# Patient Record
Sex: Male | Born: 1950
Health system: Southern US, Community
[De-identification: ages and names within clinical notes are randomized; demographics above are authoritative.]

## PROBLEM LIST (undated history)

## (undated) DIAGNOSIS — Z950 Presence of cardiac pacemaker: Secondary | ICD-10-CM

## (undated) DIAGNOSIS — I442 Atrioventricular block, complete: Secondary | ICD-10-CM

## (undated) DIAGNOSIS — I10 Essential (primary) hypertension: Secondary | ICD-10-CM

## (undated) DIAGNOSIS — M1990 Unspecified osteoarthritis, unspecified site: Secondary | ICD-10-CM

## (undated) DIAGNOSIS — I4891 Unspecified atrial fibrillation: Secondary | ICD-10-CM

## (undated) DIAGNOSIS — Z9989 Dependence on other enabling machines and devices: Secondary | ICD-10-CM

## (undated) DIAGNOSIS — G4733 Obstructive sleep apnea (adult) (pediatric): Secondary | ICD-10-CM

## (undated) DIAGNOSIS — G4730 Sleep apnea, unspecified: Secondary | ICD-10-CM

## (undated) HISTORY — PX: JOINT REPLACEMENT: SHX530

## (undated) HISTORY — DX: Obstructive sleep apnea (adult) (pediatric): G47.33

## (undated) HISTORY — DX: Sleep apnea, unspecified: G47.30

## (undated) HISTORY — DX: Essential (primary) hypertension: I10

## (undated) HISTORY — PX: KNEE SURGERY: SHX244

## (undated) HISTORY — PX: COLONOSCOPY: SHX174

## (undated) HISTORY — PX: APPENDECTOMY: SHX54

## (undated) HISTORY — DX: Dependence on other enabling machines and devices: Z99.89

---

## 2005-04-21 ENCOUNTER — Ambulatory Visit (HOSPITAL_COMMUNITY): Admission: RE | Admit: 2005-04-21 | Discharge: 2005-04-21 | Payer: Self-pay | Admitting: Internal Medicine

## 2009-09-23 ENCOUNTER — Encounter: Payer: Self-pay | Admitting: Pulmonary Disease

## 2009-10-18 ENCOUNTER — Telehealth (INDEPENDENT_AMBULATORY_CARE_PROVIDER_SITE_OTHER): Payer: Self-pay | Admitting: *Deleted

## 2009-10-25 ENCOUNTER — Ambulatory Visit: Payer: Self-pay | Admitting: Pulmonary Disease

## 2009-10-25 DIAGNOSIS — G4733 Obstructive sleep apnea (adult) (pediatric): Secondary | ICD-10-CM

## 2009-11-15 ENCOUNTER — Telehealth (INDEPENDENT_AMBULATORY_CARE_PROVIDER_SITE_OTHER): Payer: Self-pay | Admitting: *Deleted

## 2009-11-16 ENCOUNTER — Encounter: Payer: Self-pay | Admitting: Pulmonary Disease

## 2009-11-18 ENCOUNTER — Telehealth (INDEPENDENT_AMBULATORY_CARE_PROVIDER_SITE_OTHER): Payer: Self-pay | Admitting: *Deleted

## 2009-12-06 ENCOUNTER — Telehealth (INDEPENDENT_AMBULATORY_CARE_PROVIDER_SITE_OTHER): Payer: Self-pay | Admitting: *Deleted

## 2009-12-06 ENCOUNTER — Ambulatory Visit: Payer: Self-pay | Admitting: Pulmonary Disease

## 2010-01-02 ENCOUNTER — Encounter: Payer: Self-pay | Admitting: Pulmonary Disease

## 2010-01-23 ENCOUNTER — Encounter: Payer: Self-pay | Admitting: Pulmonary Disease

## 2010-04-27 NOTE — Letter (Signed)
Summary: Physician's handwritten letter for patient  Physician's handwritten letter for patient   Imported By: Sherian Rein 11/24/2009 11:29:12  _____________________________________________________________________  External Attachment:    Type:   Image     Comment:   External Document

## 2010-04-27 NOTE — Assessment & Plan Note (Signed)
Summary: consult for management of osa   Copy to:  Elvina Sidle Primary Provider/Referring Provider:  Elvina Sidle  CC:  Sleep Consult.  History of Present Illness: The pt is a 60y/o male who I have been asked to see for osa.  He underwent npsg in June of this year, and found to have mild to moderate osa with AHI of 20/hr with desats to 75%.  He was titrated as part of a split night study to an optimal pressure of 7cm.  The pt has been told that he has loud snoring, as well as pauses in his breathing during sleep.  He works as a Airline pilot, and has a variable sleep schedule.  He will typically get to bed around 10pm, and arises at 6am to start his day.  He feels rested upon arising.  He drives at all hours, and will take naps whenever he can while working.  He denies any alertness issues during the day, and denies any sleepiness while watching tv or movies on his days off.  He denies any sleepiness while driving.  His epworth score was 5.  Current Medications (verified): 1)  No Prescription Medications  Allergies (verified): No Known Drug Allergies  Past History:  Past Medical History: NONE PER PT.   Past Surgical History: APPENDECTOMY 1960S  Family History: Reviewed history and no changes required. heart disease: father cancer: mother (liver)   Social History: Reviewed history and no changes required. pt is married and lives with wife. pt has children. pt has hx of smoking occ cigars and pipe x 5 to 6 years.   pt works as a Naval architect.   Review of Systems  The patient denies shortness of breath with activity, shortness of breath at rest, productive cough, non-productive cough, coughing up blood, chest pain, irregular heartbeats, acid heartburn, indigestion, loss of appetite, weight change, abdominal pain, difficulty swallowing, sore throat, tooth/dental problems, headaches, nasal congestion/difficulty breathing through nose, sneezing, itching, ear ache, anxiety,  depression, hand/feet swelling, joint stiffness or pain, rash, change in color of mucus, and fever.    Vital Signs:  Patient profile:   60 year old male Height:      71 inches Weight:      257.38 pounds BMI:     36.03 O2 Sat:      96 % on Room air Temp:     98.2 degrees F oral Pulse rate:   81 / minute BP sitting:   116 / 80  (left arm) Cuff size:   large  Vitals Entered By: Arman Filter LPN (October 25, 2009 11:05 AM)  O2 Flow:  Room air CC: Sleep Consult Comments Medications reviewed with patient Arman Filter LPN  October 25, 2009 11:05 AM    Physical Exam  General:  obese male in nad Eyes:  PERRLA and EOMI.   Nose:  deviated septum to right with partial obstruction Mouth:  elongation of soft palate and uvula Neck:  no jvd, tmg, LN Lungs:  clear to auscultation Heart:  rrr, no mrg Abdomen:  soft and nontender, bs+ Extremities:  no edema or cyanosis, pulses intact dstally Neurologic:  alert, and oriented, moves all 4.   Impression & Recommendations:  Problem # 1:  OBSTRUCTIVE SLEEP APNEA (ICD-327.23) the pt has been diagnosed with mild to moderate osa, but denies any significant sleepiness issues during awake hours.  However, he works in a high risk occupation that will require him to either treat his osa, or be able  to document normal alertness during the day with a MWT at the sleep center.  I have recommended the former, and the pt is aggreeable.  Will go ahead and start him on his optimal cpap pressure of 7cm, and have asked him to work aggressively on weight loss.  I have also reminded him of his moral responsibility to not drive if he is sleepy.   Medications Added to Medication List This Visit: 1)  No Prescription Medications   Other Orders: Consultation Level IV (16109) DME Referral (DME)  Patient Instructions: 1)  will start on cpap at 7cm of pressure as discussed.  Please call if any tolerance issues. 2)  work on weight loss 3)  followup with me in 4  weeks, and please bring your machine to the visit.

## 2010-04-27 NOTE — Progress Notes (Signed)
Summary: download c pap-LMTCBx1  Phone Note Call from Patient Call back at [phone]   Caller: Patient Call For: clance Summary of Call: need to have c pap machine downloaded at [facility] today.would like to speak to megan to see if this is possible. Initial call taken by: Rickard Patience,  December 06, 2009 8:49 AM  Follow-up for Phone Call        spoke to [facility] and she states pt can bring in his machine, cord and any chips to [facility] and they will try to get download. I LMTCBx1 to advise the pt.Jennifer [facility] CMA  December 06, 2009 10:21 AM   pt was already seen today by dr. Shelle Iron and we were able to get down load off of his machine.  nothing further needed.  Aundra Millet Reynolds LPN  December 06, 2009 12:07 PM

## 2010-04-27 NOTE — Miscellaneous (Signed)
Summary: excellent compliance on cpap download  Clinical Lists Changes  download from 11/2009 to 12/2009 shows excellent compliance and control of osa

## 2010-04-27 NOTE — Progress Notes (Signed)
Summary: fax request/ cpap download - awaiting fax  Phone Note Call from Patient   Caller: Patient Call For: clance Summary of Call: pt has completed his cpap (usage of 4 hrs a day x 3 wks). needs [facility]. 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 please call pt to confirm when this is completed. [phone] Initial call taken by: Tivis Ringer, CNA,  November 15, 2009 10:40 AM  Follow-up for Phone Call        Aundra Millet, have you seen any results from [facility] on this patients CPAP? Michel Bickers [facility]  November 15, 2009 12:55 PM  no download on him.  per the last ov with [facility] on 10-25-2009, he was to f/u in 4 weeks with [facility] and bring his machine with to the visit and we would get a download then.  Aundra Millet Reynolds LPN  November 15, 2009 1:39 PM   Additional Follow-up for Phone Call Additional follow up Details #1::        [facility] Gweneth Dimitri RN  November 15, 2009 1:51 PM    Please call pt back re: being in compliance w/cpap machine - [phone]  Additional Follow-up by: Eugene Gavia,  November 15, 2009 3:18 PM    Additional Follow-up for Phone Call Additional follow up Details #2::    Called and spoke with pt.  He states that he has been using CPAP x 3 wks.  He states that instead of bringing his machine here and getting download, he wants [place] to call [facility] and get the 3 results of 3 wk download for [facility] to review now so that he can get the forms for his job filled out.  He wants to be able to drive out of state by the end of this wk and can not do this without having form filled out stating that he has been compliant with CPAP.  Pls advise thanks Follow-up by: Vernie Murders,  November 15, 2009 3:41 PM  Additional Follow-up for Phone Call Additional follow up Details #3:: Details for Additional Follow-up Action Taken: I am ok with doing that if his job will accept  only 3 weeks of data.  ATC pt NA, and unable to leave msg, WCB Vernie Murders  November 15, 2009 5:04 PM  Spoke with [facility] and they will have to check with Mardelle Matte regarding download status and will call us back and ask for [place].Michel Bickers [facility]  November 16, 2009 9:44 AM  According to [facility] they have not received the pt's card to do the download from CPAP. He will news to turn in his card for this to be done.Michel Bickers [facility]  November 16, 2009 9:51 AM  download shows excellent compliance and adequate control of AHI.  Will send a letter as above.  Additional Follow-up by: Barbaraann Share MD,  November 15, 2009 5:00 PM    Spoke with pt.  Pt states he is on "a modem" for 90 days that he should not have to take his card into [facility]--will call [facility] about this.  Pt requesting for this letter along with copy of cpap data be faxed to [facility] program Attn: Dr. Gemma Payor at [phone].  Gweneth Dimitri RN  November 16, 2009 10:07 AM   [facility].  Spoke with Darilyn.  Informed her pt states he  is on a modem.  Darilyn states this is correct and will fax over data to triage.  Once data is received will give to Megan/place in [facility] very important folder.  Gweneth Dimitri RN  November 16, 2009 10:08 AM  Data received and given to [facility] to place in [facility] very important folder.  Will forward message to [facility] as FYI.  Gweneth Dimitri RN  November 16, 2009 10:24 AM    [facility] did a letter.  Called pt and informed him letter is ready for [place] to fax to [facility]. but pt will need to sign a release of info giving [place] permission to fax this letter.  pt states he is currently  on the road and is unable to get to the office to sign ROI and has no access to a fax machine for me to fax ROI for him to sign.  Therefore, pt gave me a verbal ok to fax letter to his job and he understood that letter had his medical information documented in it. Faxed letter to [facility] at [phone].  Pt is also aware to keep pending appt  with [facility] for Sept 12 @ 11:00am  Arman Filter LPN  November 17, 2009 10:34 AM

## 2010-04-27 NOTE — Assessment & Plan Note (Signed)
Summary: rov for osa   Copy to:  Elvina Sidle Primary Segundo Makela/Referring Thailan Sava:  Elvina Sidle  CC:  follow up on cpap. uses 7/7 nights x5-6 hrs a night. Pt states he has no problmes with machine and states pressure feels like it is set fine.  History of Present Illness: the pt comes in today for f/u of his known osa.  He is wearing cpap compliantly, and reports no issues with mask or pressure.  He has been compliant with his device, and download today shows a 95% compliance greater than 4 hours.  He has no significant mask leak, and a well controlled AHI.  He believes that he is sleeping well, and thinks his alertness has improved.  Current Medications (verified): 1)  No Prescription Medications  Allergies (verified): No Known Drug Allergies  Review of Systems  The patient denies shortness of breath with activity, shortness of breath at rest, productive cough, non-productive cough, coughing up blood, chest pain, irregular heartbeats, acid heartburn, indigestion, loss of appetite, weight change, abdominal pain, difficulty swallowing, sore throat, tooth/dental problems, headaches, nasal congestion/difficulty breathing through nose, sneezing, itching, ear ache, anxiety, depression, hand/feet swelling, joint stiffness or pain, rash, change in color of mucus, and fever.    Vital Signs:  Patient profile:   60 year old male Height:      71 inches Weight:      259.25 pounds O2 Sat:      98 % on Room air Temp:     98.0 degrees F oral Pulse rate:   78 / minute BP sitting:   138 / 92  (right arm) Cuff size:   large  Vitals Entered By: Carver Fila (December 06, 2009 10:53 AM)  O2 Flow:  Room air CC: follow up on cpap. uses 7/7 nights x5-6 hrs a night. Pt states he has no problmes with machine and states pressure feels like it is set fine Comments meds and allergies updated Phone number updated Carver Fila  December 06, 2009 10:54 AM    Physical Exam  General:  ow male in nad   Nose:  no skin breakdown or pressure necrosis from cpap mask Extremities:  no edema or cyanosis  Neurologic:  alert, not sleepy, moves all 4.   Impression & Recommendations:  Problem # 1:  OBSTRUCTIVE SLEEP APNEA (ICD-327.23)  the pt is doing very well with cpap.  He is wearing compliantly as documented by a download done today, and feels that he has had a positive clinical response.  I have filled out paperwork to allow him to get his driver's card, and have encouraged him to work aggressively on weight loss.    Other Orders: Est. Patient Level III (56213)  Patient Instructions: 1)  continue with cpap, call if any issues with tolerance 2)  work on weight loss 3)  followup with me in 2mos. for download review.

## 2010-04-27 NOTE — Progress Notes (Signed)
Summary: records?   Phone Note Call from Patient Call back at [phone]   Caller: Patient Call For: clance Reason for Call: Talk to Nurse Summary of Call: Please verify we have sleep study eval on pt prior to visit on 08/01.  If we do not have, please contact pt asap so that he can get copy for [place]. Also, Members occupational health from [place] - pt had CPX done there. Initial call taken by: Eugene Gavia,  October 18, 2009 9:41 AM  Follow-up for Phone Call        no records up front in file.  LMOM TCB x1. Boone Master CNA/MA  October 18, 2009 10:43 AM   Notified pt that we didn't have any of his paperwork yet.  He said he understood and would work on getting those records to [place] before his visit.. Follow-up by: Eugene Gavia,  October 18, 2009 1:42 PM  Additional Follow-up for Phone Call Additional follow up Details #1::        noted by triage.  will sign off on message. Boone Master CNA/MA  October 18, 2009 1:45 PM

## 2010-04-27 NOTE — Progress Notes (Signed)
Summary: letter & info  Phone Note Call from Patient Call back at [phone]   Caller: Patient Call For: clance Reason for Call: Talk to Nurse Summary of Call: on cpap, suppose to get download from [facility] re: cpap - letter sent to [employer] from [facility] under care and all.  But needs download and any info re: compliance will help him get medical card.  Need to send everything that goes to [employer] # [phone] and  to also go to his company, [employer] attn: Whitney at [phone] Initial call taken by: Eugene Gavia,  November 18, 2009 9:13 AM  Follow-up for Phone Call        called and spoke with pt. pt states he also wants a copy of his compliance download faxed to [place] and his job, [employer] .  Faxed compliance download to both fax #s provided.  Aundra Millet Reynolds LPN  November 18, 2009 9:54 AM

## 2010-08-05 ENCOUNTER — Encounter: Payer: Self-pay | Admitting: Pulmonary Disease

## 2010-08-09 ENCOUNTER — Ambulatory Visit (INDEPENDENT_AMBULATORY_CARE_PROVIDER_SITE_OTHER): Payer: 59 | Admitting: Pulmonary Disease

## 2010-08-09 ENCOUNTER — Encounter: Payer: Self-pay | Admitting: Pulmonary Disease

## 2010-08-09 VITALS — BP 130/80 | HR 64 | Temp 98.1°F | Ht 71.0 in | Wt 269.0 lb

## 2010-08-09 DIAGNOSIS — G4733 Obstructive sleep apnea (adult) (pediatric): Secondary | ICD-10-CM

## 2010-08-09 NOTE — Assessment & Plan Note (Signed)
The pt is doing very well with cpap.  He is very compliant by download, no significant leaks, and a well controlled AHI.  I have asked him to continue with cpap, and encouraged him to work on weight reduction.  He will f/u with me in one year.

## 2010-08-09 NOTE — Progress Notes (Signed)
Subjective:     Patient ID: Harold Robertson, male   DOB: [birth year], 60 y.o.   MRN: [id number]  HPI The pt comes in today for f/u of his known osa.  He is wearing the device compliantly by his download, and his AHI is well controlled on his current settings.  He feels he sleeps well, and denies any issues with daytime sleepiness.  He has no sleepiness with driving.  He has been keeping up with needed supplies and mask changes.   Review of Systems  Constitutional: Negative for fever and unexpected weight change.  HENT: Negative for ear pain, nosebleeds, congestion, sore throat, rhinorrhea, sneezing, trouble swallowing, dental problem, postnasal drip and sinus pressure.   Eyes: Negative for redness and itching.  Respiratory: Negative for cough, chest tightness, shortness of breath and wheezing.   Cardiovascular: Negative for palpitations and leg swelling.  Gastrointestinal: Negative for nausea and vomiting.  Genitourinary: Negative for dysuria.  Musculoskeletal: Negative for joint swelling.  Skin: Negative for rash.  Neurological: Negative for headaches.  Hematological: Does not bruise/bleed easily.  Psychiatric/Behavioral: Negative for dysphoric mood. The patient is not nervous/anxious.        Objective:   Physical Exam Ow male in nad No skin breakdown or pressure necrosis from cpap mask LE without edema, no cyanosis Alert, does not appear sleepy, moves all 4     Assessment:      Plan:

## 2010-08-09 NOTE — Patient Instructions (Signed)
Continue with cpap, and work on weight reduction followup with me in one year.

## 2010-08-17 ENCOUNTER — Encounter: Payer: Self-pay | Admitting: Pulmonary Disease

## 2011-07-17 ENCOUNTER — Ambulatory Visit: Payer: 59 | Admitting: Family Medicine

## 2011-07-17 VITALS — BP 155/94 | HR 82 | Temp 98.6°F | Resp 16 | Ht 68.5 in | Wt 279.0 lb

## 2011-07-17 DIAGNOSIS — I1310 Hypertensive heart and chronic kidney disease without heart failure, with stage 1 through stage 4 chronic kidney disease, or unspecified chronic kidney disease: Secondary | ICD-10-CM

## 2011-07-17 DIAGNOSIS — R079 Chest pain, unspecified: Secondary | ICD-10-CM

## 2011-07-17 DIAGNOSIS — I10 Essential (primary) hypertension: Secondary | ICD-10-CM | POA: Insufficient documentation

## 2011-07-17 DIAGNOSIS — I447 Left bundle-branch block, unspecified: Secondary | ICD-10-CM

## 2011-07-17 MED ORDER — LISINOPRIL 20 MG PO TABS: 20.0000 mg | ORAL_TABLET | Freq: Every day | ORAL | Status: DC

## 2011-07-17 NOTE — Patient Instructions (Signed)
Obesity Obesity is defined as having a body mass index (BMI) of 30 or more. To calculate your BMI divide your weight in pounds by your height in inches squared and multiply that product by 703. Major illnesses resulting from long-term obesity include:  Stroke.   Heart disease.   Diabetes.   Many cancers.   Arthritis.  Obesity also complicates recovery from many other medical problems.  CAUSES   A history of obesity in your parents.   Thyroid hormone imbalance.   Environmental factors such as excess calorie intake and physical inactivity.  TREATMENT  A healthy weight loss program includes:  A calorie restricted diet based on individual calorie needs.   Increased physical activity (exercise).  An exercise program is just as important as the right low-calorie diet.  Weight-loss medicines should be used only under the supervision of your physician. These medicines help, but only if they are used with diet and exercise programs. Medicines can have side effects including nervousness, nausea, abdominal pain, diarrhea, headache, drowsiness, and depression.  An unhealthy weight loss program includes:  Fasting.   Fad diets.   Supplements and drugs.  These choices do not succeed in long-term weight control.  HOME CARE INSTRUCTIONS  To help you make the needed dietary changes:   Exercise and perform physical activity as directed by your caregiver.   Keep a daily record of everything you eat. There are many free websites to help you with this. It may be helpful to measure your foods so you can determine if you are eating the correct portion sizes.   Use low-calorie cookbooks or take special cooking classes.   Avoid alcohol. Drink more water and drinks with no calories.   Take vitamins and supplements only as recommended by your caregiver.   Weight loss support groups, Registered Dieticians, counselors, and stress reduction education can also be very helpful.  Document Released:  04/20/2004 Document Revised: 03/02/2011 Document Reviewed: 02/17/2007 [facility] Patient Information 2012 [place], [place].Hypertension As your heart beats, it forces blood through your arteries. This force is your blood pressure. If the pressure is too high, it is called hypertension (HTN) or high blood pressure. HTN is dangerous because you may have it and not know it. High blood pressure may mean that your heart has to work harder to pump blood. Your arteries may be narrow or stiff. The extra work puts you at risk for heart disease, stroke, and other problems.  Blood pressure consists of two numbers, a higher number over a lower, 110/72, for example. It is stated as "110 over 72." The ideal is below 120 for the top number (systolic) and under 80 for the bottom (diastolic). Write down your blood pressure today. You should pay close attention to your blood pressure if you have certain conditions such as:  Heart failure.   Prior heart attack.   Diabetes   Chronic kidney disease.   Prior stroke.   Multiple risk factors for heart disease.  To see if you have HTN, your blood pressure should be measured while you are seated with your arm held at the level of the heart. It should be measured at least twice. A one-time elevated blood pressure reading (especially in the Emergency Department) does not mean that you need treatment. There may be conditions in which the blood pressure is different between your right and left arms. It is important to see your caregiver soon for a recheck. Most people have essential hypertension which means that there is not a  specific cause. This type of high blood pressure may be lowered by changing lifestyle factors such as:  Stress.   Smoking.   Lack of exercise.   Excessive weight.   Drug/tobacco/alcohol use.   Eating less salt.  Most people do not have symptoms from high blood pressure until it has caused damage to the body. Effective treatment can often  prevent, delay or reduce that damage. TREATMENT  When a cause has been identified, treatment for high blood pressure is directed at the cause. There are a large number of medications to treat HTN. These fall into several categories, and your caregiver will help you select the medicines that are best for you. Medications may have side effects. You should review side effects with your caregiver. If your blood pressure stays high after you have made lifestyle changes or started on medicines,   Your medication(s) may need to be changed.   Other problems may need to be addressed.   Be certain you understand your prescriptions, and know how and when to take your medicine.   Be sure to follow up with your caregiver within the time frame advised (usually within two weeks) to have your blood pressure rechecked and to review your medications.   If you are taking more than one medicine to lower your blood pressure, make sure you know how and at what times they should be taken. Taking two medicines at the same time can result in blood pressure that is too low.  SEEK IMMEDIATE MEDICAL CARE IF:  You develop a severe headache, blurred or changing vision, or confusion.   You have unusual weakness or numbness, or a faint feeling.   You have severe chest or abdominal pain, vomiting, or breathing problems.  MAKE SURE YOU:   Understand these instructions.   Will watch your condition.   Will get help right away if you are not doing well or get worse.  Document Released: 03/13/2005 Document Revised: 03/02/2011 Document Reviewed: 11/01/2007 [facility] Patient Information 2012 [place], [place].

## 2011-07-17 NOTE — Progress Notes (Signed)
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 2 weeks ago he experience some vague left chest pain which was associated with a cough. The cough is resolved and the chest pain is a day and only intermittently noticeable at this point. He's been checking his blood pressure on the road and has been getting higher readings lately. He has a physical scheduled for the middle of the summer but because of the chest pain and the elevated readings, he's come by for further evaluation.  He's had no shortness of breath, orthopnea, edema, or nausea. He's had no abdominal pain, new rash, sore throat or reflux symptoms. He does have sleep apnea and is using his CPAP machine 90% of the time  Family history: No new illnesses  Objective: No acute distress, obese white male. 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 He has a persistent heart murmur which has not gone thoroughly evaluated and has left bundle branch block.   patient is aware that he has a weight problem and we discussed ways of reducing that.  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   Plan: Echocardiogram, start antihypertensive therapy, recheck blood pressure one month

## 2011-08-18 ENCOUNTER — Encounter: Payer: Self-pay | Admitting: Pulmonary Disease

## 2011-08-18 ENCOUNTER — Ambulatory Visit (INDEPENDENT_AMBULATORY_CARE_PROVIDER_SITE_OTHER): Payer: 59 | Admitting: Pulmonary Disease

## 2011-08-18 VITALS — BP 120/82 | HR 89 | Temp 97.6°F | Ht 71.0 in | Wt 276.2 lb

## 2011-08-18 DIAGNOSIS — G4733 Obstructive sleep apnea (adult) (pediatric): Secondary | ICD-10-CM

## 2011-08-18 NOTE — Assessment & Plan Note (Signed)
The patient is doing very well from a sleep apnea standpoint.  He is wearing his device compliantly, is sleeping well, and has excellent daytime alertness.  I've asked him to continue on CPAP, keep up with mass changes and supplies, and to work aggressively on weight loss.  He will followup with me in one year if doing well

## 2011-08-18 NOTE — Progress Notes (Signed)
  Subjective:    Patient ID: Harold Robertson, male    DOB: 1950/06/06, 61 y.o.   MRN: [id number]  HPI The patient comes in today for followup of his known obstructive sleep apnea.  He is wearing CPAP compliantly, and is having no issues with mask fit or pressure.  He feels that he is sleeping well, and has excellent daytime alertness.  His download today shows fantastic compliance, no significant leak, and good control of his obstructive events.   Review of Systems  Constitutional: Negative for fever and unexpected weight change.  HENT: Negative for ear pain, nosebleeds, congestion, sore throat, rhinorrhea, sneezing, trouble swallowing, dental problem, postnasal drip and sinus pressure.   Eyes: Negative for redness and itching.  Respiratory: Negative for cough, chest tightness, shortness of breath and wheezing.   Cardiovascular: Negative for palpitations and leg swelling.  Gastrointestinal: Negative for nausea and vomiting.  Genitourinary: Negative for dysuria.  Musculoskeletal: Negative for joint swelling.  Skin: Negative for rash.  Neurological: Negative for headaches.  Hematological: Does not bruise/bleed easily.  Psychiatric/Behavioral: Negative for dysphoric mood. The patient is not nervous/anxious.        Objective:   Physical Exam Overweight male in no acute distress Nose without purulence or discharge noted No skin breakdown or pressure necrosis from the CPAP mask Lower extremities with no edema, no cyanosis Alert and oriented, moves all 4 extremities.      Assessment & Plan:

## 2011-08-18 NOTE — Patient Instructions (Signed)
Continue with cpap, and keep up with mask changes and supplies. Work on weight loss followup with me in one year.  

## 2011-10-12 ENCOUNTER — Encounter: Payer: Self-pay | Admitting: Family Medicine

## 2012-08-07 ENCOUNTER — Other Ambulatory Visit: Payer: Self-pay | Admitting: Family Medicine

## 2012-09-16 ENCOUNTER — Ambulatory Visit: Payer: 59 | Admitting: Family Medicine

## 2012-09-16 VITALS — BP 122/94 | HR 60 | Temp 98.0°F | Resp 18 | Ht 69.0 in | Wt 272.6 lb

## 2012-09-16 DIAGNOSIS — I10 Essential (primary) hypertension: Secondary | ICD-10-CM

## 2012-09-16 MED ORDER — LISINOPRIL 20 MG PO TABS: ORAL_TABLET | ORAL | Status: DC

## 2012-09-16 NOTE — Progress Notes (Signed)
Patient ID: Harold Robertson MRN: [id number], DOB: 1950-12-25, 62 y.o. Date of Encounter: 09/16/2012, 2:51 PM  Primary Physician: Elvina Sidle, MD  Chief Complaint: HTN  HPI: 62 y.o. year old male with history below presents for hypertension follow up.  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   Past Medical History  Diagnosis Date  . Hypertension      Home Meds: Prior to Admission medications   Medication Sig Start Date End Date Taking? Authorizing Provider  lisinopril (PRINIVIL,ZESTRIL) 20 MG tablet One daily 09/16/12  Yes Elvina Sidle, MD    Allergies: No Known Allergies  History   Social History  . Marital Status: Single    Spouse Name: N/A    Number of Children: N/A  . Years of Education: N/A   Occupational History  . truck driver    Social History Main Topics  . Smoking status: Former Smoker -- 6 years    Types: Cigars  . Smokeless tobacco: Current User  . Alcohol Use: 1.2 oz/week    2 Cans of beer per week  . Drug Use: Yes  . Sexually Active: Yes   Other Topics Concern  . Not on file   Social History Narrative   Married, lives with wife   children     Family History  Problem Relation Age of Onset  . Heart disease Father   . Liver cancer Mother   . Heart disease Brother     Review of Systems: Constitutional: negative for chills, fever, night sweats, weight changes, or fatigue  HEENT: negative for vision changes, hearing loss, congestion, rhinorrhea, ST, epistaxis, or sinus pressure  He does not some bilateral pressure in ears Cardiovascular: negative for chest pain, palpitations, or DOE Respiratory: negative for hemoptysis, wheezing, shortness of breath, or cough Abdominal: negative for abdominal pain, nausea, vomiting, diarrhea, or constipation Dermatological: negative for rash Neurologic: negative for headache, dizziness, or syncope All other  systems reviewed and are otherwise negative with the exception to those above and in the HPI.   Physical Exam:  BP recheck 105/70 Blood pressure 122/94, pulse 60, temperature 98 F (36.7 C), temperature source Oral, resp. rate 18, height 5\' 9"  (1.753 m), weight 272 lb 9.6 oz (123.651 kg), SpO2 99.00%., Body mass index is 40.24 kg/(m^2). General: Well developed, well nourished, in no acute distress. Head: Normocephalic, atraumatic, eyes without discharge, sclera non-icteric, nares are without discharge. Bilateral auditory canals clear, TM's are without perforation, pearly grey but opaque and without much of a  reflective cone of light bilaterally. Oral cavity moist, posterior pharynx without exudate, erythema, peritonsillar abscess, or post nasal drip.  Neck: Supple. No thyromegaly. Full ROM. No lymphadenopathy. No carotid bruits. Lungs: Clear bilaterally to auscultation without wheezes, rales, or rhonchi. Breathing is unlabored. Heart: RRR with S1 S2. No murmurs, rubs, or gallops appreciated.  Abdomen: Soft, non-tender, non-distended with normoactive bowel sounds. No hepatosplenomegaly. No rebound/guarding. No obvious abdominal masses. Msk:  Strength and tone normal for age. Extremities/Skin: Warm and dry. No clubbing or cyanosis. No edema. No rashes or suspicious lesions. Distal pulses 2+ and equal bilaterally. Neuro: Alert and oriented X 3. Moves all extremities spontaneously. Gait is normal. CNII-XII grossly in tact. DTR 2+, cerebellar function intact. Rhomberg normal. Psych:  Responds to questions appropriately with a normal affect.    ASSESSMENT AND PLAN:  62 y.o. year old male with controlled sleep apnea and hypertension.  Some congestion c/w serous otitis Hypertension - Plan: lisinopril (PRINIVIL,ZESTRIL) 20 MG tablet   -  Signed, Elvina Sidle, MD 09/16/2012 2:51 PM

## 2012-09-16 NOTE — Patient Instructions (Addendum)
claritin or allegra for the ear stuffiness, Afrin  Blood pressure recheck 105/70 right arm sitting

## 2013-02-12 ENCOUNTER — Encounter: Payer: Self-pay | Admitting: Family Medicine

## 2013-08-21 ENCOUNTER — Other Ambulatory Visit: Payer: Self-pay | Admitting: Family Medicine

## 2013-09-15 ENCOUNTER — Ambulatory Visit (INDEPENDENT_AMBULATORY_CARE_PROVIDER_SITE_OTHER): Payer: 59 | Admitting: Family Medicine

## 2013-09-15 VITALS — BP 128/82 | HR 71 | Temp 97.9°F | Resp 16 | Ht 70.0 in | Wt 276.0 lb

## 2013-09-15 DIAGNOSIS — I10 Essential (primary) hypertension: Secondary | ICD-10-CM

## 2013-09-15 MED ORDER — LISINOPRIL 20 MG PO TABS
20.0000 mg | ORAL_TABLET | Freq: Every day | ORAL | Status: DC
Start: 2013-09-15 — End: 2013-09-30

## 2013-09-15 NOTE — Progress Notes (Signed)
   Subjective:    Patient ID: Harold Robertson, male    DOB: 1950-04-11, 63 y.o.   MRN: [id number]  HPI Patient presents today for refill of lisinopril. He was last seen at [facility] 1 year ago. He does not receive care anywhere else. He denies medication side effects. 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 He reports that he lost weight in the last year, but has gradually regained it.   He has not had a complete physical exam in awhile, is willing to have labs and CPE at a later date. He has waited for 3 hours today and refuses to stay for blood work.    Review of Systems No chest pain, no SOB, no edema. No nausea, no vomiting, no diarrhea.    Objective:   Physical Exam  Vitals reviewed. Constitutional: He is oriented to person, place, and time. He appears well-developed and well-nourished.  HENT:  Head: Normocephalic and atraumatic.  Right Ear: External ear normal.  Left Ear: External ear normal.  Eyes: Conjunctivae are normal. Right eye exhibits no discharge. Left eye exhibits no discharge. No scleral icterus.  Neck: Normal range of motion. Neck supple.  Cardiovascular: Normal rate.   Pulmonary/Chest: Effort normal and breath sounds normal.  Musculoskeletal: Normal range of motion.  Neurological: He is oriented to person, place, and time.  Skin: Skin is warm and dry.  Psychiatric: He has a normal mood and affect. His behavior is normal. Judgment and thought content normal.      Assessment & Plan:  1. Essential hypertension - lisinopril (PRINIVIL,ZESTRIL) 20 MG tablet; Take 1 tablet (20 mg total) by mouth daily.  Dispense: 90 tablet; Refill: 1 -Patient to call and make an appointment at the appointment center for CPE in 3-6 months.  Emi Belfast, FNP-BC  [facility]  09/15/2013 1:46 PM

## 2013-09-15 NOTE — Patient Instructions (Addendum)
Please call for an appointment in 3-6 months Increase activity and decrease food intake- make healthier choices- limit white bread, white rice, white pasta and sugar.  Hypertension Hypertension, commonly called high blood pressure, is when the force of blood pumping through your arteries is too strong. Your arteries are the blood vessels that carry blood from your heart throughout your body. A blood pressure reading consists of a higher number over a lower number, such as 110/72. The higher number (systolic) is the pressure inside your arteries when your heart pumps. The lower number (diastolic) is the pressure inside your arteries when your heart relaxes. Ideally you want your blood pressure below 120/80. Hypertension forces your heart to work harder to pump blood. Your arteries may become narrow or stiff. Having hypertension puts you at risk for heart disease, stroke, and other problems.  RISK FACTORS Some risk factors for high blood pressure are controllable. Others are not.  Risk factors you cannot control include:   Race. You may be at higher risk if you are African American.  Age. Risk increases with age.  Gender. Men are at higher risk than women before age 62 years. After age 62, women are at higher risk than men. Risk factors you can control include:  Not getting enough exercise or physical activity.  Being overweight.  Getting too much fat, sugar, calories, or salt in your diet.  Drinking too much alcohol. SIGNS AND SYMPTOMS Hypertension does not usually cause signs or symptoms. Extremely high blood pressure (hypertensive crisis) may cause headache, anxiety, shortness of breath, and nosebleed. DIAGNOSIS  To check if you have hypertension, your health care provider will measure your blood pressure while you are seated, with your arm held at the level of your heart. It should be measured at least twice using the same arm. Certain conditions can cause a difference in blood pressure  between your right and left arms. A blood pressure reading that is higher than normal on one occasion does not mean that you need treatment. If one blood pressure reading is high, ask your health care provider about having it checked again. TREATMENT  Treating high blood pressure includes making lifestyle changes and possibly taking medication. Living a healthy lifestyle can help lower high blood pressure. You may need to change some of your habits. Lifestyle changes may include:  Following the DASH diet. This diet is high in fruits, vegetables, and whole grains. It is low in salt, red meat, and added sugars.  Getting at least 2 1/2 hours of brisk physical activity every week.  Losing weight if necessary.  Not smoking.  Limiting alcoholic beverages.  Learning ways to reduce stress. If lifestyle changes are not enough to get your blood pressure under control, your health care provider may prescribe medicine. You may need to take more than one. Work closely with your health care provider to understand the risks and benefits. HOME CARE INSTRUCTIONS  Have your blood pressure rechecked as directed by your health care provider.   Only take medicine as directed by your health care provider. Follow the directions carefully. Blood pressure medicines must be taken as prescribed. The medicine does not work as well when you skip doses. Skipping doses also puts you at risk for problems.   Do not smoke.   Monitor your blood pressure at home as directed by your health care provider. SEEK MEDICAL CARE IF:   You think you are having a reaction to medicines taken.  You have recurrent headaches or feel  dizzy.  You have swelling in your ankles.  You have trouble with your vision. SEEK IMMEDIATE MEDICAL CARE IF:  You develop a severe headache or confusion.  You have unusual weakness, numbness, or feel faint.  You have severe chest or abdominal pain.  You vomit repeatedly.  You have  trouble breathing. MAKE SURE YOU:   Understand these instructions.  Will watch your condition.  Will get help right away if you are not doing well or get worse. Document Released: 03/13/2005 Document Revised: 03/18/2013 Document Reviewed: 01/03/2013 [facility] Patient Information 2015 [place], [place]. This information is not intended to replace advice given to you by your health care provider. Make sure you discuss any questions you have with your health care provider.

## 2013-09-30 ENCOUNTER — Other Ambulatory Visit: Payer: Self-pay

## 2013-09-30 DIAGNOSIS — I10 Essential (primary) hypertension: Secondary | ICD-10-CM

## 2013-09-30 MED ORDER — LISINOPRIL 20 MG PO TABS: 20.0000 mg | ORAL_TABLET | Freq: Every day | ORAL | Status: DC

## 2014-03-29 ENCOUNTER — Other Ambulatory Visit: Payer: Self-pay | Admitting: Family Medicine

## 2014-05-02 ENCOUNTER — Other Ambulatory Visit: Payer: Self-pay | Admitting: Physician Assistant

## 2014-05-28 ENCOUNTER — Other Ambulatory Visit: Payer: Self-pay | Admitting: Physician Assistant

## 2014-06-08 ENCOUNTER — Other Ambulatory Visit: Payer: Self-pay | Admitting: Family Medicine

## 2014-06-09 ENCOUNTER — Other Ambulatory Visit: Payer: Self-pay | Admitting: Family Medicine

## 2014-06-09 NOTE — Telephone Encounter (Signed)
Called pt and advised he needs f/up. He agreed that he needs to get in for CPE and will try to get in end of April when he thinks he will be back in town. He does not need any RFs now though, he has about 3 mos worth.

## 2014-06-10 NOTE — Telephone Encounter (Signed)
Called pt to ask about f/up plan. LMOM to CB to discuss.

## 2014-08-03 ENCOUNTER — Encounter: Payer: Self-pay | Admitting: Family Medicine

## 2014-08-03 ENCOUNTER — Ambulatory Visit (INDEPENDENT_AMBULATORY_CARE_PROVIDER_SITE_OTHER): Payer: 59 | Admitting: Family Medicine

## 2014-08-03 VITALS — BP 116/82 | HR 95 | Temp 98.2°F | Resp 18 | Ht 69.0 in | Wt 277.0 lb

## 2014-08-03 DIAGNOSIS — Z125 Encounter for screening for malignant neoplasm of prostate: Secondary | ICD-10-CM | POA: Diagnosis not present

## 2014-08-03 DIAGNOSIS — Z131 Encounter for screening for diabetes mellitus: Secondary | ICD-10-CM | POA: Diagnosis not present

## 2014-08-03 DIAGNOSIS — I10 Essential (primary) hypertension: Secondary | ICD-10-CM | POA: Diagnosis not present

## 2014-08-03 DIAGNOSIS — Z1211 Encounter for screening for malignant neoplasm of colon: Secondary | ICD-10-CM | POA: Diagnosis not present

## 2014-08-03 DIAGNOSIS — Z13220 Encounter for screening for lipoid disorders: Secondary | ICD-10-CM | POA: Diagnosis not present

## 2014-08-03 DIAGNOSIS — Z0000 Encounter for general adult medical examination without abnormal findings: Secondary | ICD-10-CM

## 2014-08-03 DIAGNOSIS — Z800 Family history of malignant neoplasm of digestive organs: Secondary | ICD-10-CM | POA: Diagnosis not present

## 2014-08-03 LAB — COMPREHENSIVE METABOLIC PANEL
ALK PHOS: 57 U/L (ref 39–117)
ALT: 25 U/L (ref 0–53)
AST: 31 U/L (ref 0–37)
Albumin: 3.9 g/dL (ref 3.5–5.2)
BILIRUBIN TOTAL: 1.7 mg/dL — AB (ref 0.2–1.2)
BUN: 24 mg/dL — ABNORMAL HIGH (ref 6–23)
CO2: 24 mEq/L (ref 19–32)
CREATININE: 1.27 mg/dL (ref 0.50–1.35)
Calcium: 9.1 mg/dL (ref 8.4–10.5)
Chloride: 103 mEq/L (ref 96–112)
Glucose, Bld: 95 mg/dL (ref 70–99)
Potassium: 4.1 mEq/L (ref 3.5–5.3)
Sodium: 136 mEq/L (ref 135–145)
Total Protein: 6.4 g/dL (ref 6.0–8.3)

## 2014-08-03 LAB — LIPID PANEL
Cholesterol: 204 mg/dL — ABNORMAL HIGH (ref 0–200)
HDL: 49 mg/dL (ref >=40)
LDL CALC: 136 mg/dL — AB (ref 0–99)
TRIGLYCERIDES: 95 mg/dL (ref <150)
Total CHOL/HDL Ratio: 4.2 Ratio
VLDL: 19 mg/dL (ref 0–40)

## 2014-08-03 LAB — POCT URINALYSIS DIPSTICK
Blood, UA: NEGATIVE
Glucose, UA: NEGATIVE
LEUKOCYTES UA: NEGATIVE
Nitrite, UA: NEGATIVE
PH UA: 5
Spec Grav, UA: >=1.03
Urobilinogen, UA: 1

## 2014-08-03 MED ORDER — FLUTICASONE PROPIONATE 50 MCG/ACT NA SUSP: 2.0000 | Freq: Every day | NASAL | Status: DC

## 2014-08-03 MED ORDER — ZOSTER VACCINE LIVE 19400 UNT/0.65ML ~~LOC~~ SOLR
0.6500 mL | Freq: Once | SUBCUTANEOUS | Status: DC
Start: 2014-08-03 — End: 2015-01-15

## 2014-08-03 MED ORDER — LISINOPRIL 20 MG PO TABS: 20.0000 mg | ORAL_TABLET | Freq: Every day | ORAL | Status: DC

## 2014-08-03 NOTE — Progress Notes (Signed)
Subjective:    Patient ID: Harold Robertson, male    DOB: 07-03-1950, 64 y.o.   MRN: [id number]  08/03/2014  CPE   HPI This 64 y.o. male presents for Complete Physical Examination.  Last physical:  2014 Colonoscopy:  10-12 years ago; not sure who performed it.  Mother died of colon cancer at age 13.   TDAP:  Not sure. Pneumovax:  declined Zostavax:  never Influenza:  01/02/2014 Eye exam:  +glasses; no recent eye exam  2013. Dental exam:  Several years ago.  HTN:  Patient reports good compliance with medication, good tolerance to medication, and good symptom control.  Checks some on the road.  Running 130s.  OSA: Patient reports good compliance with medication, good tolerance to medication, and good symptom control.  95% compliance.  Head congestion: taking medication daily; not sure of name.      Review of Systems  Constitutional: Negative for fever, chills, diaphoresis, activity change, appetite change, fatigue and unexpected weight change.  HENT: Negative for congestion, dental problem, drooling, ear discharge, ear pain, facial swelling, hearing loss, mouth sores, nosebleeds, postnasal drip, rhinorrhea, sinus pressure, sneezing, sore throat, tinnitus, trouble swallowing and voice change.   Eyes: Negative for photophobia, pain, discharge, redness, itching and visual disturbance.  Respiratory: Negative for apnea, cough, choking, chest tightness, shortness of breath, wheezing and stridor.   Cardiovascular: Negative for chest pain, palpitations and leg swelling.  Gastrointestinal: Negative for nausea, vomiting, abdominal pain, diarrhea, constipation and blood in stool.  Endocrine: Negative for cold intolerance, heat intolerance, polydipsia, polyphagia and polyuria.  Genitourinary: Negative for dysuria, urgency, frequency, hematuria, flank pain, decreased urine volume, discharge, penile swelling, scrotal swelling, enuresis, difficulty urinating, genital sores, penile pain and  testicular pain.  Musculoskeletal: Negative for myalgias, back pain, joint swelling, arthralgias, gait problem, neck pain and neck stiffness.  Skin: Negative for color change, pallor, rash and wound.  Allergic/Immunologic: Negative for environmental allergies, food allergies and immunocompromised state.  Neurological: Negative for dizziness, tremors, seizures, syncope, facial asymmetry, speech difficulty, weakness, light-headedness, numbness and headaches.  Hematological: Negative for adenopathy. Does not bruise/bleed easily.  Psychiatric/Behavioral: Negative for suicidal ideas, hallucinations, behavioral problems, confusion, sleep disturbance, self-injury, dysphoric mood, decreased concentration and agitation. The patient is not nervous/anxious and is not hyperactive.     Past Medical History  Diagnosis Date  . Hypertension    Past Surgical History  Procedure Laterality Date  . Appendectomy  1960s   No Known Allergies Current Outpatient Prescriptions  Medication Sig Dispense Refill  . lisinopril (PRINIVIL,ZESTRIL) 20 MG tablet Take 1 tablet (20 mg total) by mouth daily. NO MORE REFILLS WITHOUT OFFICE VISIT - 2ND NOTICE 15 tablet 0   No current facility-administered medications for this visit.       Objective:    BP 116/82 mmHg  Pulse 95  Temp(Src) 98.2 F (36.8 C) (Oral)  Resp 18  Ht  (1.753 m)  Wt 277 lb (125.646 kg)  BMI 40.89 kg/m2  SpO2 96% Physical Exam  Constitutional: He is oriented to person, place, and time. He appears well-developed and well-nourished. No distress.  HENT:  Head: Normocephalic and atraumatic.  Right Ear: External ear normal.  Left Ear: External ear normal.  Nose: Nose normal.  Mouth/Throat: Oropharynx is clear and moist.  Eyes: Conjunctivae and EOM are normal. Pupils are equal, round, and reactive to light.  Neck: Normal range of motion. Neck supple. Carotid bruit is not present. No thyromegaly present.  Cardiovascular: Normal rate,  regular  rhythm, normal heart sounds and intact distal pulses.  Exam reveals no gallop and no friction rub.   No murmur heard. Pulmonary/Chest: Effort normal and breath sounds normal. He has no wheezes. He has no rales.  Abdominal: Soft. Bowel sounds are normal. He exhibits no distension and no mass. There is no tenderness. There is no rebound and no guarding.  Musculoskeletal:       Right shoulder: Normal.       Left shoulder: Normal.       Cervical back: Normal.  Lymphadenopathy:    He has no cervical adenopathy.  Neurological: He is alert and oriented to person, place, and time. He has normal reflexes. No cranial nerve deficit. He exhibits normal muscle tone. Coordination normal.  Skin: Skin is warm and dry. No rash noted. He is not diaphoretic.  Psychiatric: He has a normal mood and affect. His behavior is normal. Judgment and thought content normal.   No results found for this or any previous visit.     Assessment & Plan:   1. Routine physical examination   2. Essential hypertension, benign   3. Screening, lipid   4. Screening for diabetes mellitus   5. Screening for prostate cancer     No orders of the defined types were placed in this encounter.    No Follow-up on file.     Veleta Yamamoto Paulita Fujita, M.D. [facility] [address] [place], [place]  [address] [phone] phone [phone] fax

## 2014-08-03 NOTE — Patient Instructions (Signed)

## 2014-08-04 ENCOUNTER — Encounter: Payer: Self-pay | Admitting: Family Medicine

## 2014-08-04 ENCOUNTER — Telehealth: Payer: Self-pay | Admitting: *Deleted

## 2014-08-04 DIAGNOSIS — I447 Left bundle-branch block, unspecified: Secondary | ICD-10-CM

## 2014-08-04 LAB — CBC WITH DIFFERENTIAL/PLATELET
BASOS PCT: 1 % (ref 0–1)
Basophils Absolute: 0.1 10*3/uL (ref 0.0–0.1)
EOS PCT: 3 % (ref 0–5)
Eosinophils Absolute: 0.2 10*3/uL (ref 0.0–0.7)
HCT: 45.1 % (ref 39.0–52.0)
Hemoglobin: 15.8 g/dL (ref 13.0–17.0)
LYMPHS PCT: 31 % (ref 12–46)
Lymphs Abs: 1.6 10*3/uL (ref 0.7–4.0)
MCH: 30.1 pg (ref 26.0–34.0)
MCHC: 35 g/dL (ref 30.0–36.0)
MCV: 85.9 fL (ref 78.0–100.0)
MONO ABS: 0.5 10*3/uL (ref 0.1–1.0)
MPV: 10.5 fL (ref 8.6–12.4)
Monocytes Relative: 10 % (ref 3–12)
NEUTROS ABS: 2.9 10*3/uL (ref 1.7–7.7)
Neutrophils Relative %: 55 % (ref 43–77)
Platelets: 210 10*3/uL (ref 150–400)
RBC: 5.25 MIL/uL (ref 4.22–5.81)
RDW: 13.8 % (ref 11.5–15.5)
WBC: 5.2 10*3/uL (ref 4.0–10.5)

## 2014-08-04 LAB — TSH: TSH: 1.249 u[IU]/mL (ref 0.350–4.500)

## 2014-08-04 LAB — PSA: PSA: 0.91 ng/mL (ref <=4.00)

## 2014-08-04 LAB — HEMOGLOBIN A1C
Hgb A1c MFr Bld: 5.7 % — ABNORMAL HIGH (ref <5.7)
MEAN PLASMA GLUCOSE: 117 mg/dL — AB (ref <117)

## 2014-08-04 NOTE — Telephone Encounter (Signed)
This pt called wanting his results from his EKG on 08/03/2014.  I advised pt that I would get Dr. Katrinka Blazing to interpret the results and someone will call him back in the next 48 hours.  Pt understood and stated it would be okay to leave an message on his VM regarding results.

## 2014-08-06 NOTE — Telephone Encounter (Signed)
Spoke with pt. Never had Echo. Never had stress done.

## 2014-08-06 NOTE — Telephone Encounter (Signed)
Call --- EKG showed Left Bundle Branch Block.  This is common finding as we age and indicates a slight abnormality in heart conduction; his EKG is unchanged from his last EKG in 2013.  I see that Dr. Milus Glazier ordered an echocardiogram in 2013; did patient ever have this done?  Has he ever had a stress test?

## 2014-08-13 NOTE — Telephone Encounter (Signed)
Call --- I have placed a referral to cardiology for further evaluation regarding Left Bundle Branch Block on EKG.  He will need to undergo an echocardiogram and likely stress test.

## 2014-08-13 NOTE — Telephone Encounter (Signed)
Spoke with pt, advised message. Pt understood. 

## 2014-10-21 ENCOUNTER — Ambulatory Visit (INDEPENDENT_AMBULATORY_CARE_PROVIDER_SITE_OTHER): Payer: 59 | Admitting: Family Medicine

## 2014-10-21 VITALS — BP 126/88 | HR 83 | Temp 97.5°F | Resp 18 | Ht 69.0 in | Wt 274.4 lb

## 2014-10-21 DIAGNOSIS — I10 Essential (primary) hypertension: Secondary | ICD-10-CM

## 2014-10-21 DIAGNOSIS — J309 Allergic rhinitis, unspecified: Secondary | ICD-10-CM

## 2014-10-21 MED ORDER — FLUTICASONE PROPIONATE 50 MCG/ACT NA SUSP: 2.0000 | Freq: Every day | NASAL | Status: DC

## 2014-10-21 MED ORDER — LISINOPRIL 20 MG PO TABS: ORAL_TABLET | ORAL | Status: DC

## 2014-10-21 MED ORDER — PREDNISONE 20 MG PO TABS: ORAL_TABLET | ORAL | Status: DC

## 2014-10-21 NOTE — Progress Notes (Signed)
[facility] [address], [place] [address] [phone]  Date:  10/21/2014   Name:  Harold Robertson   DOB:  03-18-1951   MRN:  [id number]  PCP:  Elvina Sidle, MD    Chief Complaint: DOT and Medication Refill   History of Present Illness:  Harold Robertson is a 64 y.o. very pleasant male patient who presents with the following:  He recently had a CPE here in May- at that time his BP looked fine.  He had been on lisinopril 20 mg.  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  He has one month before his card runs out.    He has noted some allergies and head congestion recently- he has uses some aleve and some cold products- he is not sure if this could have raised his BP.  States that he has noted nasal congestion, his ears feeling clogged, and itchy nose for about one month.  Not sure if flonase was helping but he ran out of it His wife also has noted that he seems to be getting hard of hearing- she has noted this for a year or more.   He has not had any HA, CP, SOB or other sx of concern He is feeling well overall, just concerned abut his BP He is able to check his BP at home for monitoring He uses CPAP as directed for OSA  BP Readings from Last 3 Encounters:  10/21/14 148/96  08/03/14 116/82  09/15/13 128/82     Patient Active Problem List   Diagnosis Date Noted  . Hypertension 07/17/2011  . Left bundle branch block 07/17/2011  . OBSTRUCTIVE SLEEP APNEA 10/25/2009    Past Medical History  Diagnosis Date  . Hypertension   . OSA on CPAP     Past Surgical History  Procedure Laterality Date  . Appendectomy  1960s    History  Substance Use Topics  . Smoking status: Former Smoker -- 6 years    Types: Cigars  . Smokeless tobacco: Current User  . Alcohol Use: 1.2 oz/week    2 Cans of beer per week    Family History  Problem Relation Age of Onset  . Heart disease Father 37    AMI  . Liver cancer  Mother   . Cancer Mother 31    Colon cancer with liver mets  . Heart disease Brother 65    AMI    No Known Allergies  Medication list has been reviewed and updated.  Current Outpatient Prescriptions on File Prior to Visit  Medication Sig Dispense Refill  . lisinopril (PRINIVIL,ZESTRIL) 20 MG tablet Take 1 tablet (20 mg total) by mouth daily. 90 tablet 3  . fluticasone (FLONASE) 50 MCG/ACT nasal spray Place 2 sprays into both nostrils daily. (Patient not taking: Reported on 10/21/2014) 16 g 11  . zoster vaccine live, PF, (ZOSTAVAX) 44818 UNT/0.65ML injection Inject 19,400 Units into the skin once. (Patient not taking: Reported on 10/21/2014) 0.65 mL 0   No current facility-administered medications on file prior to visit.    Review of Systems:  As per HPI- otherwise negative.   Physical Examination: Filed Vitals:   10/21/14 1403  BP: 148/96  Pulse: 83  Temp: 97.5 F (36.4 C)  Resp: 18   Filed Vitals:   10/21/14 1403  Height: 5\' 9"  (1.753 m)  Weight: 274 lb 6.4 oz (124.467 kg)   Body mass index is  40.5 kg/(m^2). Ideal Body Weight: Weight in (lb) to have BMI = 25: 168.9  GEN: WDWN, NAD, Non-toxic, A & O x 3, obese, looks well HEENT: Atraumatic, Normocephalic. Neck supple. No masses, No LAD. Ears and Nose: No external deformity. CV: RRR, No M/G/R. No JVD. No thrill. No extra heart sounds. PULM: CTA B, no wheezes, crackles, rhonchi. No retractions. No resp. distress. No accessory muscle use. EXTR: No c/c/e NEURO Normal gait.  PSYCH: Normally interactive. Conversant. Not depressed or anxious appearing.  Calm demeanor.   Assessment and Plan: Essential hypertension - Plan: lisinopril (PRINIVIL,ZESTRIL) 20 MG tablet  Allergic rhinitis, unspecified allergic rhinitis type - Plan: predniSONE (DELTASONE) 20 MG tablet, fluticasone (FLONASE) 50 MCG/ACT nasal spray  His elevated BP may be due to cold medication and NSAID use recently.  However we want to make sure he can pass his  DOT.  Will increase his lisinopril to 30 mg- he will go to 40 mg if BP still high Suspect his reduced hearing is due to hearing loss, but ETD also possible Will try a short course of prednisone for him, did recommend that he see an audiolgoist as well Refilled his flonase  Signed Abbe Amsterdam, MD

## 2014-10-21 NOTE — Patient Instructions (Addendum)
Good to see you today!  Take the prednisone for the next 6 days to see if it helps your sinus and "clogged ear" issues However you likely do have some hearing loss and might benefit from hearing aids Continue flonase as needed for sinus sx Increase your lisinopril to 30 mg a day- please let me know how you do on this dose If you are still running over 140/90 we can go to 40 mg a   You can call [facility] to reschedule your colonoscopy  Address: [address] [place], [place], [place] [address]  Phone: [phone]

## 2014-11-19 ENCOUNTER — Ambulatory Visit: Payer: Self-pay | Admitting: Cardiology

## 2015-01-15 ENCOUNTER — Ambulatory Visit (INDEPENDENT_AMBULATORY_CARE_PROVIDER_SITE_OTHER): Payer: 59 | Admitting: Cardiovascular Disease

## 2015-01-15 ENCOUNTER — Encounter: Payer: Self-pay | Admitting: Cardiovascular Disease

## 2015-01-15 VITALS — BP 120/88 | HR 70 | Ht 70.0 in | Wt 282.4 lb

## 2015-01-15 DIAGNOSIS — E785 Hyperlipidemia, unspecified: Secondary | ICD-10-CM

## 2015-01-15 DIAGNOSIS — I447 Left bundle-branch block, unspecified: Secondary | ICD-10-CM | POA: Diagnosis not present

## 2015-01-15 DIAGNOSIS — Z79899 Other long term (current) drug therapy: Secondary | ICD-10-CM | POA: Diagnosis not present

## 2015-01-15 NOTE — Progress Notes (Signed)
Cardiology Office Note   Date:  01/15/2015   ID:  [name], DOB 11/04/1950, MRN [id number]  PCP:  Elvina Sidle, MD  Cardiologist:   Madilyn Hook, MD   Chief Complaint  Patient presents with  . New Evaluation    pt c/o no dizziness  . Chest Pain    only when working hard  . Shortness of Breath    when working to hard  . Edema    in ankles or driving       History of Present Illness: Harold Robertson is a 64 y.o. male with hypertension, OSA and LBBB who presents for evaluation of LBBB. [name] reports being evaluated by his PCP where he was noted to have a LBBB on ECG.  He feels well and denies chest pain, shortness of breath, palpitations, lightheadedness, or dizziness.  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  He does not get any exercise and reports that his diet is poor.  He uses his CPAP machine consistently and typically gets 8 hours of sleep.    [name] notes some mild LE edema when he stands for prolonged periods of time.  It improves with elevation and he denies orthopnea or PND.  In the past he smoked cigarettes and recently stopped smoking a pipe and cigar.   Past Medical History  Diagnosis Date  . Hypertension   . OSA on CPAP     Past Surgical History  Procedure Laterality Date  . Appendectomy  1960s     Current Outpatient Prescriptions  Medication Sig Dispense Refill  . aspirin 81 MG tablet Take 81 mg by mouth daily.    Marland Kitchen lisinopril (PRINIVIL,ZESTRIL) 20 MG tablet Take 30- 40 mg daily for high blood pressure 180 tablet 3  . loratadine (CLARITIN) 10 MG tablet Take 10 mg by mouth daily.    . naproxen sodium (ANAPROX) 220 MG tablet Take 220 mg by mouth daily.     No current facility-administered medications for this visit.    Allergies:   Review of patient's allergies indicates no known allergies.    Social History:  The patient  reports that he has quit smoking. His  smoking use included Cigars. He uses smokeless tobacco. He reports that he drinks about 1.2 oz of alcohol per week. He reports that he uses illicit drugs.   Family History:  The patient's family history includes Cancer (age of onset: 35) in his mother; Heart disease in his maternal grandmother; Heart disease (age of onset: 68) in his brother; Heart disease (age of onset: 78) in his father; Liver cancer in his mother.    ROS:  Please see the history of present illness.   Otherwise, review of systems are positive for none.   All other systems are reviewed and negative.    PHYSICAL EXAM: VS:  BP 120/88 mmHg  Pulse 70  Ht 5\' 10"  (1.778 m)  Wt 128.096 kg (282 lb 6.4 oz)  BMI 40.52 kg/m2 , BMI Body mass index is 40.52 kg/(m^2). GENERAL:  Well appearing HEENT:  Pupils equal round and reactive, fundi not visualized, oral mucosa unremarkable NECK:  No jugular venous distention, waveform within normal limits, carotid upstroke brisk and symmetric, no bruits, no thyromegaly LYMPHATICS:  No cervical adenopathy LUNGS:  Clear to auscultation bilaterally HEART:  RRR.  PMI not displaced or sustained,S1 and S2 within normal limits, no S3, no  S4, no clicks, no rubs, no murmurs ABD:  Flat, positive bowel sounds normal in frequency in pitch, no bruits, no rebound, no guarding, no midline pulsatile mass, no hepatomegaly, no splenomegaly EXT:  2 plus pulses throughout, no edema, no cyanosis no clubbing SKIN:  No rashes no nodules NEURO:  Cranial nerves II through XII grossly intact, motor grossly intact throughout PSYCH:  Cognitively intact, oriented to person place and time    EKG:  EKG is ordered today. The ekg ordered today demonstrates sinus rhythm at 70 bpm.  LBBB.   Recent Labs: 08/03/2014: ALT 25; BUN 24*; Creat 1.27; Hemoglobin 15.8; Platelets 210; Potassium 4.1; Sodium 136; TSH 1.249    Lipid Panel    Component Value Date/Time   CHOL 204* 08/03/2014 1409   TRIG 95 08/03/2014 1409   HDL 49  08/03/2014 1409   CHOLHDL 4.2 08/03/2014 1409   VLDL 19 08/03/2014 1409   LDLCALC 136* 08/03/2014 1409      Wt Readings from Last 3 Encounters:  01/15/15 128.096 kg (282 lb 6.4 oz)  10/21/14 124.467 kg (274 lb 6.4 oz)  08/03/14 125.646 kg (277 lb)      ASSESSMENT AND PLAN:  # LBBB: Mr. Stiff has a LBBB on ECG.  Upon review of his records, he has had this since at least 2013.  He has no symptoms of ischemia or heart failure.  Therefore, we will not perform an ischemia evaluation at this time.  # CV Disease Prevention: [name] has several risk factors for cardiac diease including hypertension, age and obesity.  His lifestyle is very sedentary and his diet is poor.  We discussed the recommendation for 30-40 minutes of exercise most days of the week.  We also discussed options to improve his diet.  His lipids were elevated when checked 07/2014.  He plans to make lifestyle changes over the next 3 months and we will then repeat his lipids. If his ASCVD 10 year risk of MI or CVA is >5%, we will start a statin.  Continue aspirin.  # Hypertension: BP well-controlled.  Continue lisinopril.    # Tobacco abuse: Mr. Mccambridge was congratulated on his recent decision to stop smoking.    Current medicines are reviewed at length with the patient today.  The patient does not have concerns regarding medicines.  The following changes have been made:  no change  Labs/ tests ordered today include:  No orders of the defined types were placed in this encounter.     Disposition:   FU with Remedy Corporan C. Duke Salvia, MD in 1 year, check lipids in 3 months.   Signed, Madilyn Hook, MD  01/15/2015 4:02 PM    [facility]

## 2015-01-15 NOTE — Patient Instructions (Signed)
Your physician recommends that you return for lab work in 3 months - FASTING.  Dr Duke Salvia recommends that you schedule a follow-up appointment in 1 year. You will receive a reminder letter in the mail two months in advance. If you don't receive a letter, please call our office to schedule the follow-up appointment.  If you need a refill on your cardiac medications before your next appointment, please call your pharmacy.

## 2015-01-16 ENCOUNTER — Encounter: Payer: Self-pay | Admitting: Cardiovascular Disease

## 2015-03-24 ENCOUNTER — Encounter: Payer: Self-pay | Admitting: *Deleted

## 2015-09-08 ENCOUNTER — Emergency Department (HOSPITAL_COMMUNITY): Payer: 59

## 2015-09-08 ENCOUNTER — Encounter (HOSPITAL_COMMUNITY): Payer: Self-pay | Admitting: Physical Medicine and Rehabilitation

## 2015-09-08 ENCOUNTER — Ambulatory Visit (INDEPENDENT_AMBULATORY_CARE_PROVIDER_SITE_OTHER): Payer: 59 | Admitting: Family Medicine

## 2015-09-08 ENCOUNTER — Encounter (HOSPITAL_COMMUNITY)
Admission: EM | Disposition: A | Discharge status: Home / Self Care | Payer: Self-pay | Source: Home / Self Care | Attending: Cardiology

## 2015-09-08 ENCOUNTER — Inpatient Hospital Stay (HOSPITAL_COMMUNITY)
Admission: EM | Admit: 2015-09-08 | Discharge: 2015-09-10 | DRG: 243 | Disposition: A | Discharge status: Home / Self Care | Payer: 59 | Attending: Cardiology | Admitting: Cardiology

## 2015-09-08 VITALS — BP 160/88 | HR 30 | Temp 97.4°F | Resp 16 | Ht 70.0 in | Wt 297.0 lb

## 2015-09-08 DIAGNOSIS — I447 Left bundle-branch block, unspecified: Secondary | ICD-10-CM

## 2015-09-08 DIAGNOSIS — T24331A Burn of third degree of right lower leg, initial encounter: Secondary | ICD-10-CM | POA: Diagnosis present

## 2015-09-08 DIAGNOSIS — G4733 Obstructive sleep apnea (adult) (pediatric): Secondary | ICD-10-CM | POA: Diagnosis present

## 2015-09-08 DIAGNOSIS — X17XXXA Contact with hot engines, machinery and tools, initial encounter: Secondary | ICD-10-CM | POA: Diagnosis present

## 2015-09-08 DIAGNOSIS — Z8249 Family history of ischemic heart disease and other diseases of the circulatory system: Secondary | ICD-10-CM | POA: Diagnosis not present

## 2015-09-08 DIAGNOSIS — Z7982 Long term (current) use of aspirin: Secondary | ICD-10-CM | POA: Diagnosis not present

## 2015-09-08 DIAGNOSIS — I428 Other cardiomyopathies: Secondary | ICD-10-CM | POA: Diagnosis present

## 2015-09-08 DIAGNOSIS — R001 Bradycardia, unspecified: Secondary | ICD-10-CM | POA: Diagnosis not present

## 2015-09-08 DIAGNOSIS — I442 Atrioventricular block, complete: Principal | ICD-10-CM | POA: Diagnosis present

## 2015-09-08 DIAGNOSIS — Z79899 Other long term (current) drug therapy: Secondary | ICD-10-CM

## 2015-09-08 DIAGNOSIS — I10 Essential (primary) hypertension: Secondary | ICD-10-CM

## 2015-09-08 DIAGNOSIS — I2510 Atherosclerotic heart disease of native coronary artery without angina pectoris: Secondary | ICD-10-CM | POA: Diagnosis present

## 2015-09-08 DIAGNOSIS — Z6841 Body Mass Index (BMI) 40.0 and over, adult: Secondary | ICD-10-CM | POA: Diagnosis not present

## 2015-09-08 DIAGNOSIS — E669 Obesity, unspecified: Secondary | ICD-10-CM | POA: Diagnosis present

## 2015-09-08 DIAGNOSIS — Z95818 Presence of other cardiac implants and grafts: Secondary | ICD-10-CM

## 2015-09-08 DIAGNOSIS — E785 Hyperlipidemia, unspecified: Secondary | ICD-10-CM | POA: Diagnosis present

## 2015-09-08 DIAGNOSIS — F17290 Nicotine dependence, other tobacco product, uncomplicated: Secondary | ICD-10-CM | POA: Diagnosis present

## 2015-09-08 DIAGNOSIS — I509 Heart failure, unspecified: Secondary | ICD-10-CM | POA: Diagnosis not present

## 2015-09-08 HISTORY — PX: CARDIAC CATHETERIZATION: SHX172

## 2015-09-08 LAB — COMPREHENSIVE METABOLIC PANEL
ALBUMIN: 3.3 g/dL — AB (ref 3.5–5.0)
ALT: 25 U/L (ref 17–63)
ANION GAP: 7 (ref 5–15)
AST: 20 U/L (ref 15–41)
Alkaline Phosphatase: 48 U/L (ref 38–126)
BILIRUBIN TOTAL: 2.3 mg/dL — AB (ref 0.3–1.2)
BUN: 10 mg/dL (ref 6–20)
CO2: 23 mmol/L (ref 22–32)
Calcium: 9.1 mg/dL (ref 8.9–10.3)
Chloride: 110 mmol/L (ref 101–111)
Creatinine, Ser: 1.1 mg/dL (ref 0.61–1.24)
GFR calc Af Amer: >60 mL/min (ref >60)
GFR calc non Af Amer: >60 mL/min (ref >60)
GLUCOSE: 90 mg/dL (ref 65–99)
POTASSIUM: 4.2 mmol/L (ref 3.5–5.1)
SODIUM: 140 mmol/L (ref 135–145)
TOTAL PROTEIN: 5.8 g/dL — AB (ref 6.5–8.1)

## 2015-09-08 LAB — I-STAT CHEM 8, ED
BUN: 12 mg/dL (ref 6–20)
CALCIUM ION: 1.18 mmol/L (ref 1.13–1.30)
CHLORIDE: 105 mmol/L (ref 101–111)
Creatinine, Ser: 1.1 mg/dL (ref 0.61–1.24)
Glucose, Bld: 86 mg/dL (ref 65–99)
HEMATOCRIT: 47 % (ref 39.0–52.0)
Hemoglobin: 16 g/dL (ref 13.0–17.0)
POTASSIUM: 4.2 mmol/L (ref 3.5–5.1)
SODIUM: 141 mmol/L (ref 135–145)
TCO2: 24 mmol/L (ref 0–100)

## 2015-09-08 LAB — ECHOCARDIOGRAM COMPLETE
FS: 28 % (ref 28–44)
IV/PV OW: 0.99
LA ID, A-P, ES: 63 mm
LA diam index: 2.55 cm/m2
LA vol A4C: 157 ml
LA vol: 153 mL
LAVOLIN: 61.9 mL/m2
LDCA: 3.46 cm2
LEFT ATRIUM END SYS DIAM: 63 mm
LVOTD: 21 mm
MVPG: 2 mmHg
MVPKEVEL: 71.3 m/s
PW: 13.1 mm — AB (ref 0.6–1.1)
RV TAPSE: 36 mm

## 2015-09-08 LAB — CBC WITH DIFFERENTIAL/PLATELET
BASOS PCT: 0 %
Basophils Absolute: 0 10*3/uL (ref 0.0–0.1)
Eosinophils Absolute: 0 10*3/uL (ref 0.0–0.7)
Eosinophils Relative: 1 %
HEMATOCRIT: 45.9 % (ref 39.0–52.0)
HEMOGLOBIN: 15.5 g/dL (ref 13.0–17.0)
Lymphocytes Relative: 25 %
Lymphs Abs: 1.7 10*3/uL (ref 0.7–4.0)
MCH: 28.4 pg (ref 26.0–34.0)
MCHC: 33.8 g/dL (ref 30.0–36.0)
MCV: 84.1 fL (ref 78.0–100.0)
MONOS PCT: 7 %
Monocytes Absolute: 0.5 10*3/uL (ref 0.1–1.0)
NEUTROS ABS: 4.6 10*3/uL (ref 1.7–7.7)
NEUTROS PCT: 67 %
Platelets: 152 10*3/uL (ref 150–400)
RBC: 5.46 MIL/uL (ref 4.22–5.81)
RDW: 14 % (ref 11.5–15.5)
WBC: 6.8 10*3/uL (ref 4.0–10.5)

## 2015-09-08 LAB — TROPONIN I: Troponin I: 0.03 ng/mL (ref <0.031)

## 2015-09-08 LAB — TSH: TSH: 2.347 u[IU]/mL (ref 0.350–4.500)

## 2015-09-08 LAB — MRSA PCR SCREENING: MRSA by PCR: NEGATIVE

## 2015-09-08 LAB — BRAIN NATRIURETIC PEPTIDE: B Natriuretic Peptide: 414.9 pg/mL — ABNORMAL HIGH (ref 0.0–100.0)

## 2015-09-08 LAB — MAGNESIUM: Magnesium: 2 mg/dL (ref 1.7–2.4)

## 2015-09-08 SURGERY — TEMPORARY PACEMAKER
Laterality: Right | Wound class: Clean

## 2015-09-08 MED ORDER — ACETAMINOPHEN 325 MG PO TABS
650.0000 mg | ORAL_TABLET | ORAL | Status: DC | PRN
  Administered 2015-09-10: 650 mg via ORAL
  Filled 2015-09-08: qty 2

## 2015-09-08 MED ORDER — ACETAMINOPHEN 325 MG PO TABS: 650.0000 mg | ORAL_TABLET | ORAL | Status: DC | PRN

## 2015-09-08 MED ORDER — SODIUM CHLORIDE 0.9 % IV SOLN: 250.0000 mL | INTRAVENOUS | Status: DC

## 2015-09-08 MED ORDER — SODIUM CHLORIDE 0.9% FLUSH: 3.0000 mL | INTRAVENOUS | Status: DC | PRN

## 2015-09-08 MED ORDER — GENTAMICIN SULFATE 40 MG/ML IJ SOLN
80.0000 mg | INTRAMUSCULAR | Status: DC
  Filled 2015-09-08: qty 2

## 2015-09-08 MED ORDER — SODIUM CHLORIDE 0.9 % IV SOLN
250.0000 mL | INTRAVENOUS | Status: DC | PRN
  Administered 2015-09-08 – 2015-09-09 (×2): 250 mL via INTRAVENOUS

## 2015-09-08 MED ORDER — ASPIRIN 81 MG PO CHEW
81.0000 mg | CHEWABLE_TABLET | ORAL | Status: AC
  Administered 2015-09-09: 81 mg via ORAL
  Filled 2015-09-08: qty 1

## 2015-09-08 MED ORDER — SODIUM CHLORIDE 0.9 % IV SOLN: INTRAVENOUS | Status: DC

## 2015-09-08 MED ORDER — HYDRALAZINE HCL 20 MG/ML IJ SOLN: 10.0000 mg | INTRAMUSCULAR | Status: DC | PRN

## 2015-09-08 MED ORDER — SODIUM CHLORIDE 0.9% FLUSH
3.0000 mL | Freq: Two times a day (BID) | INTRAVENOUS | Status: DC
  Administered 2015-09-08: 3 mL via INTRAVENOUS

## 2015-09-08 MED ORDER — HEPARIN (PORCINE) IN NACL 2-0.9 UNIT/ML-% IJ SOLN
INTRAMUSCULAR | Status: DC | PRN
  Administered 2015-09-08: 17:00:00

## 2015-09-08 MED ORDER — HEPARIN (PORCINE) IN NACL 2-0.9 UNIT/ML-% IJ SOLN
INTRAMUSCULAR | Status: AC
  Filled 2015-09-08: qty 500

## 2015-09-08 MED ORDER — SODIUM CHLORIDE 0.9 % IR SOLN
Status: AC
  Filled 2015-09-08: qty 2

## 2015-09-08 MED ORDER — ASPIRIN EC 81 MG PO TBEC: 81.0000 mg | DELAYED_RELEASE_TABLET | Freq: Every day | ORAL | Status: DC

## 2015-09-08 MED ORDER — SODIUM CHLORIDE 0.9% FLUSH: 3.0000 mL | Freq: Two times a day (BID) | INTRAVENOUS | Status: DC

## 2015-09-08 MED ORDER — CHLORHEXIDINE GLUCONATE 4 % EX LIQD
60.0000 mL | Freq: Once | CUTANEOUS | Status: DC
  Filled 2015-09-08: qty 60

## 2015-09-08 MED ORDER — LIDOCAINE HCL (PF) 1 % IJ SOLN
INTRAMUSCULAR | Status: DC | PRN
  Administered 2015-09-08: 30 mL

## 2015-09-08 MED ORDER — LIDOCAINE HCL (PF) 1 % IJ SOLN
INTRAMUSCULAR | Status: AC
  Filled 2015-09-08: qty 30

## 2015-09-08 MED ORDER — DEXTROSE 5 % IV SOLN
3.0000 g | INTRAVENOUS | Status: DC
  Administered 2015-09-09: 3 g via INTRAVENOUS
  Filled 2015-09-08: qty 3000

## 2015-09-08 MED ORDER — ONDANSETRON HCL 4 MG/2ML IJ SOLN: 4.0000 mg | Freq: Four times a day (QID) | INTRAMUSCULAR | Status: DC | PRN

## 2015-09-08 MED ORDER — NITROGLYCERIN 0.4 MG SL SUBL: 0.4000 mg | SUBLINGUAL_TABLET | SUBLINGUAL | Status: DC | PRN

## 2015-09-08 MED ORDER — HEPARIN SODIUM (PORCINE) 5000 UNIT/ML IJ SOLN: 5000.0000 [IU] | Freq: Three times a day (TID) | INTRAMUSCULAR | Status: DC

## 2015-09-08 MED ORDER — HEPARIN SODIUM (PORCINE) 5000 UNIT/ML IJ SOLN
5000.0000 [IU] | Freq: Three times a day (TID) | INTRAMUSCULAR | Status: DC
  Administered 2015-09-08 – 2015-09-09 (×2): 5000 [IU] via SUBCUTANEOUS
  Filled 2015-09-08 (×2): qty 1

## 2015-09-08 MED ORDER — LISINOPRIL 20 MG PO TABS
20.0000 mg | ORAL_TABLET | Freq: Every day | ORAL | Status: DC
  Administered 2015-09-08: 20 mg via ORAL
  Filled 2015-09-08: qty 1

## 2015-09-08 SURGICAL SUPPLY — 5 items
CABLE ADAPT CONN TEMP 6FT (ADAPTER) ×3 IMPLANT
CATH S G BIP PACING (SET/KITS/TRAYS/PACK) ×3 IMPLANT
PACK CARDIAC CATHETERIZATION (CUSTOM PROCEDURE TRAY) ×3 IMPLANT
SHEATH PINNACLE 6F 10CM (SHEATH) ×3 IMPLANT
SLEEVE REPOSITIONING LENGTH 30 (MISCELLANEOUS) ×3 IMPLANT

## 2015-09-08 NOTE — ED Provider Notes (Signed)
CSN: [id number]     Arrival date & time 09/08/15  1415 History   First MD Initiated Contact with Patient 09/08/15 1425     Chief Complaint  Patient presents with  . Shortness of Breath     (Consider location/radiation/quality/duration/timing/severity/associated sxs/prior Treatment) HPI  65 year old male presents from urgent care with bradycardia and a complete heart block. She states she's been feeling short of breath for 2 months and has been hypertensive for 2 months. 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 He is only on lisinopril for blood pressure. He has been fatigued for a couple weeks. Today he checks his blood pressure and it was hypertensive and his heart rate was in the 30s. He went to urgent care to get evaluated. He has not specifically felt dizzy or had chest pain. Patient tells me that he was at [facility] 2 weeks ago getting his blood pressure checked and noticed heart rate was in the 30s then as well but thought this was a mistake. No other medicines besides lisinopril.  Past Medical History  Diagnosis Date  . Hypertension   . OSA on CPAP    Past Surgical History  Procedure Laterality Date  . Appendectomy  1960s   Family History  Problem Relation Age of Onset  . Heart disease Father 20    AMI  . Liver cancer Mother   . Cancer Mother 74    Colon cancer with liver mets  . Heart disease Brother 50    AMI  . Heart disease Maternal Grandmother    Social History  Substance Use Topics  . Smoking status: Former Smoker -- 6 years    Types: Cigars  . Smokeless tobacco: Current User  . Alcohol Use: 1.2 oz/week    2 Cans of beer per week    Review of Systems  Respiratory: Positive for shortness of breath.   Cardiovascular: Positive for leg swelling. Negative for chest pain.  Neurological: Negative for dizziness and light-headedness.  All other systems reviewed and are negative.     Allergies  Review of patient's allergies indicates no  known allergies.  Home Medications   Prior to Admission medications   Medication Sig Start Date End Date Taking? Authorizing Provider  aspirin 81 MG tablet Take 81 mg by mouth daily.    Historical Provider, MD  lisinopril (PRINIVIL,ZESTRIL) 20 MG tablet Take 30- 40 mg daily for high blood pressure 10/21/14   Gwenlyn Found Copland, MD  loratadine (CLARITIN) 10 MG tablet Take 10 mg by mouth daily. Reported on 09/08/2015    Historical Provider, MD  naproxen sodium (ANAPROX) 220 MG tablet Take 220 mg by mouth daily. Reported on 09/08/2015    Historical Provider, MD   BP 192/93 mmHg  Pulse 28  Temp(Src) 97.5 F (36.4 C) (Oral)  Resp 18  SpO2 99% Physical Exam  Constitutional: He is oriented to person, place, and time. He appears well-developed and well-nourished.  Morbidly obese  HENT:  Head: Normocephalic and atraumatic.  Right Ear: External ear normal.  Left Ear: External ear normal.  Nose: Nose normal.  Eyes: Right eye exhibits no discharge. Left eye exhibits no discharge.  Neck: Neck supple.  Cardiovascular: Normal heart sounds and intact distal pulses.  An irregular rhythm present. Bradycardia present.   Pulses:      Radial pulses are 2+ on the right side, and 2+ on the left side.  Pulmonary/Chest: Effort normal and breath sounds normal.  Abdominal: He exhibits no  distension.  Musculoskeletal: He exhibits edema (1+ bilateral pitting edema).  Neurological: He is alert and oriented to person, place, and time.  Skin: Skin is warm and dry.  Nursing note and vitals reviewed.   ED Course  Procedures (including critical care time) Labs Review Labs Reviewed  CBC WITH DIFFERENTIAL/PLATELET  COMPREHENSIVE METABOLIC PANEL  BRAIN NATRIURETIC PEPTIDE  TROPONIN I  MAGNESIUM  TSH  I-STAT CHEM 8, ED    Imaging Review Dg Chest Portable 1 View  09/08/2015  CLINICAL DATA:  Shortness of breath. EXAM: PORTABLE CHEST 1 VIEW COMPARISON:  None. FINDINGS: Mild cardiomegaly is noted. No  pneumothorax or pleural effusion is noted. Both lungs are clear. The visualized skeletal structures are unremarkable. IMPRESSION: Mild cardiomegaly.  No acute cardiopulmonary abnormality seen. Electronically Signed   By: Lupita Raider, M.D.   On: 09/08/2015 15:02   I have personally reviewed and evaluated these images and lab results as part of my medical decision-making.   EKG Interpretation   Date/Time:  Wednesday September 08 2015 14:18:07 EDT Ventricular Rate:  30 PR Interval:    QRS Duration: 160 QT Interval:  559 QTC Calculation: 395 R Axis:   93 Text Interpretation:  Complete AV block with wide QRS complex RBBB and  LPFB No old tracing to compare Confirmed by Melaya Hoselton MD, Royston Bekele [phone]) on  09/08/2015 2:27:19 PM      CRITICAL CARE Performed by: Pricilla Loveless T   Total critical care time: 30 minutes  Critical care time was exclusive of separately billable procedures and treating other patients.  Critical care was necessary to treat or prevent imminent or life-threatening deterioration.  Critical care was time spent personally by me on the following activities: development of treatment plan with patient and/or surrogate as well as nursing, discussions with consultants, evaluation of patient's response to treatment, examination of patient, obtaining history from patient or surrogate, ordering and performing treatments and interventions, ordering and review of laboratory studies, ordering and review of radiographic studies, pulse oximetry and re-evaluation of patient's condition.  MDM   Final diagnoses:  Complete heart block (HCC)    Patient with a heart block with a heart rate in the 20s and 30s. However he is hypertensive and shows no signs of poor perfusion at this time. Patient is not having chest pain or shortness of breath. Cardiology has been consulted. Likely this is been ongoing for a couple weeks. Patient will need pacemaker placement. Cardiology to admit. Patient has  remained stable while in the ED.    Pricilla Loveless, MD 09/08/15 1530

## 2015-09-08 NOTE — Progress Notes (Signed)
RT placed patient on CPAP of 8 HS. No O2 bleed in needed. Patient tolerating well.

## 2015-09-08 NOTE — Progress Notes (Signed)
eLink Physician-Brief Progress Note Patient Name: Harold Robertson DOB: [birth year] MRN: [id number]   Date of Service  09/08/2015  HPI/Events of Note  Admitted today with complete heart block, had temp pacer placed Stable on camera check  eICU Interventions  No eICU intervention     Intervention Category Evaluation Type: New Patient Evaluation  Max Fickle 09/08/2015, 6:40 PM
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
## 2015-09-08 NOTE — ED Notes (Signed)
Pt transported to cardiac catherization lab. Consent signed.

## 2015-09-08 NOTE — Patient Instructions (Signed)
     IF you received an x-ray today, you will receive an invoice from [facility]. Please contact [facility] at [phone] with questions or concerns regarding your invoice.   IF you received labwork today, you will receive an invoice from [facility]. Please contact [facility] at [phone] with questions or concerns regarding your invoice.   Our billing staff will not be able to assist you with questions regarding bills from these companies.  You will be contacted with the lab results as soon as they are available. The fastest way to get your results is to activate your My Chart account. Instructions are located on the last page of this paperwork. If you have not heard from us regarding the results in 2 weeks, please contact this office.      

## 2015-09-08 NOTE — Progress Notes (Signed)
  Echocardiogram 2D Echocardiogram has been performed.  Arvil Chaco 09/08/2015, 4:12 PM

## 2015-09-08 NOTE — ED Notes (Signed)
Pt was seen at [facility] today for evaluation of SOB on exertion x3 months. Denies chest pain upon arrival to ED. Swelling noted to bilateral lower extremities. Pt is alert and oriented x4.

## 2015-09-08 NOTE — Progress Notes (Signed)
By signing my name below, I, Mesha Guinyard, attest that this documentation has been prepared under the direction and in the presence of Norberto Sorenson, MD.  Electronically Signed: Arvilla Market, Medical Scribe. 09/08/2015. 1:14 PM.  Subjective:    Patient ID: Harold Robertson, male    DOB: Sep 10, 1950, 65 y.o.   MRN: [id number]  HPI Chief Complaint  Patient presents with  . Hypertension  . Sinusitis  . Dizziness  . Leg Swelling    HPI Comments: Harold Robertson is a 65 y.o. male who presents to the [facility] complaining of SOB. Pt mentions he feels winded after exersion. Pt feels like his air stops at his neck when he moves. Pt started noticing swelling in his legs over the past 90 days. Pt has been drinking more fluid lately. Pt takes lisinopril, aleve, and ASA with little relief to his symptoms. Pt normally sleeps on his side.  Pt has CPAP machine. 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  Patient Active Problem List   Diagnosis Date Noted  . Hypertension 07/17/2011  . Left bundle branch block 07/17/2011  . OBSTRUCTIVE SLEEP APNEA 10/25/2009   Past Medical History  Diagnosis Date  . Hypertension   . OSA on CPAP    Past Surgical History  Procedure Laterality Date  . Appendectomy  1960s   No Known Allergies Prior to Admission medications   Medication Sig Start Date End Date Taking? Authorizing Provider  aspirin 81 MG tablet Take 81 mg by mouth daily.   Yes Historical Provider, MD  lisinopril (PRINIVIL,ZESTRIL) 20 MG tablet Take 30- 40 mg daily for high blood pressure 10/21/14  Yes Jessica C Copland, MD  loratadine (CLARITIN) 10 MG tablet Take 10 mg by mouth daily. Reported on 09/08/2015    Historical Provider, MD  naproxen sodium (ANAPROX) 220 MG tablet Take 220 mg by mouth daily. Reported on 09/08/2015    Historical Provider, MD   Social History   Social History  . Marital Status: Single    Spouse Name: N/A  .  Number of Children: N/A  . Years of Education: N/A   Occupational History  . truck driver    Social History Main Topics  . Smoking status: Former Smoker -- 6 years    Types: Cigars  . Smokeless tobacco: Current User  . Alcohol Use: 1.2 oz/week    2 Cans of beer per week  . Drug Use: Yes  . Sexual Activity: Yes   Other Topics Concern  . Not on file   Social History Narrative   Marital status: Married x 30 years; first marriage     Children: 2 children; no grandchildren     Lives: lives with wife      Employment: truck driver x 14 years; [place]        Tobacco:  None; pipes and cigars; chews tobacco.       Alcohol:  Weekends.      Drugs:  None      Exercise:  Rarely.      Seatbelt:  100%      Guns:  Loaded and secured.      Depression screen [facility] 2/9 09/08/2015 10/21/2014  Decreased Interest 0 0  Down, Depressed, Hopeless 0 0  PHQ - 2 Score 0 0    Review of Systems  Constitutional: Negative for fever.  Respiratory: Positive for shortness of breath.   Cardiovascular: Positive for leg swelling.  Negative for palpitations.  Gastrointestinal: Negative for abdominal pain.  Musculoskeletal: Negative for back pain.  Skin: Positive for wound (right lower leg).  Neurological: Positive for dizziness.  Psychiatric/Behavioral: Negative for sleep disturbance.     Objective:   Physical Exam  Constitutional: He appears well-developed and well-nourished. No distress.  HENT:  Head: Normocephalic and atraumatic.  Eyes: Conjunctivae are normal.  Neck: Neck supple.  Cardiovascular: Regular rhythm.  Bradycardia present.   Pulmonary/Chest: Effort normal and breath sounds normal. No respiratory distress. He has no wheezes. He has no rales.  Musculoskeletal:  2+ pitting edema in his lower extremities R>L  Neurological: He is alert.  Skin: Skin is warm and dry.  Psychiatric: He has a normal mood and affect. His behavior is normal.  Nursing note and vitals reviewed.  BP 160/88  mmHg  Pulse 30  Temp(Src) 97.4 F (36.3 C) (Oral)  Resp 16  Ht 5\' 10"  (1.778 m)  Wt 297 lb (134.718 kg)  BMI 42.61 kg/m2  SpO2 98%  EKG: 3rd degree heart block Assessment & Plan:   1. Bradycardia   2. Left bundle branch block   3. Complete heart block (HCC)     Orders Placed This Encounter  Procedures  . EKG 12-Lead   911 called, transferred care to EMS who will transport pt to [facility] emergently due to new diagnosis 3rd degree heart block with profound bradycardia. Likely pt has been living with this for several months and so is momentarily stable.   I personally performed the services described in this documentation, which was scribed in my presence. The recorded information has been reviewed and considered, and addended by me as needed.   Norberto Sorenson, M.D.  [facility] [address] [place], [place] [address] [phone] phone [phone] fax  09/08/2015 3:51 PM

## 2015-09-09 ENCOUNTER — Encounter (HOSPITAL_COMMUNITY): Payer: Self-pay | Admitting: Cardiology

## 2015-09-09 ENCOUNTER — Encounter (HOSPITAL_COMMUNITY)
Admission: EM | Disposition: A | Discharge status: Home / Self Care | Payer: Self-pay | Source: Home / Self Care | Attending: Cardiology

## 2015-09-09 DIAGNOSIS — I2510 Atherosclerotic heart disease of native coronary artery without angina pectoris: Secondary | ICD-10-CM

## 2015-09-09 DIAGNOSIS — I509 Heart failure, unspecified: Secondary | ICD-10-CM

## 2015-09-09 HISTORY — PX: EP IMPLANTABLE DEVICE: SHX172B

## 2015-09-09 HISTORY — PX: CARDIAC CATHETERIZATION: SHX172

## 2015-09-09 LAB — BASIC METABOLIC PANEL
ANION GAP: 9 (ref 5–15)
BUN: 11 mg/dL (ref 6–20)
CALCIUM: 8.9 mg/dL (ref 8.9–10.3)
CO2: 24 mmol/L (ref 22–32)
Chloride: 107 mmol/L (ref 101–111)
Creatinine, Ser: 1.07 mg/dL (ref 0.61–1.24)
GFR calc non Af Amer: >60 mL/min (ref >60)
GLUCOSE: 90 mg/dL (ref 65–99)
POTASSIUM: 4 mmol/L (ref 3.5–5.1)
Sodium: 140 mmol/L (ref 135–145)

## 2015-09-09 LAB — CBC
HEMATOCRIT: 45.9 % (ref 39.0–52.0)
Hemoglobin: 15.4 g/dL (ref 13.0–17.0)
MCH: 28.3 pg (ref 26.0–34.0)
MCHC: 33.6 g/dL (ref 30.0–36.0)
MCV: 84.4 fL (ref 78.0–100.0)
Platelets: 134 10*3/uL — ABNORMAL LOW (ref 150–400)
RBC: 5.44 MIL/uL (ref 4.22–5.81)
RDW: 14 % (ref 11.5–15.5)
WBC: 7.6 10*3/uL (ref 4.0–10.5)

## 2015-09-09 LAB — LIPID PANEL
CHOLESTEROL: 169 mg/dL (ref 0–200)
HDL: 29 mg/dL — ABNORMAL LOW (ref >40)
LDL Cholesterol: 115 mg/dL — ABNORMAL HIGH (ref 0–99)
Total CHOL/HDL Ratio: 5.8 RATIO
Triglycerides: 127 mg/dL (ref <150)
VLDL: 25 mg/dL (ref 0–40)

## 2015-09-09 LAB — PROTIME-INR
INR: 1.22 (ref 0.00–1.49)
Prothrombin Time: 15.6 seconds — ABNORMAL HIGH (ref 11.6–15.2)

## 2015-09-09 SURGERY — BIV PACEMAKER INSERTION CRT-P

## 2015-09-09 SURGERY — LEFT HEART CATH AND CORONARY ANGIOGRAPHY

## 2015-09-09 MED ORDER — SODIUM CHLORIDE 0.9 % WEIGHT BASED INFUSION
1.0000 mL/kg/h | INTRAVENOUS | Status: AC
  Administered 2015-09-09: 1 mL/kg/h via INTRAVENOUS

## 2015-09-09 MED ORDER — HEPARIN (PORCINE) IN NACL 2-0.9 UNIT/ML-% IJ SOLN
INTRAMUSCULAR | Status: DC | PRN
  Administered 2015-09-09: 08:00:00 via INTRA_ARTERIAL

## 2015-09-09 MED ORDER — HEPARIN (PORCINE) IN NACL 2-0.9 UNIT/ML-% IJ SOLN
INTRAMUSCULAR | Status: AC
  Filled 2015-09-09: qty 500

## 2015-09-09 MED ORDER — FENTANYL CITRATE (PF) 100 MCG/2ML IJ SOLN
INTRAMUSCULAR | Status: DC | PRN
  Administered 2015-09-09: 25 ug via INTRAVENOUS

## 2015-09-09 MED ORDER — SODIUM CHLORIDE 0.9 % IR SOLN
Status: AC
  Filled 2015-09-09: qty 2

## 2015-09-09 MED ORDER — VERAPAMIL HCL 2.5 MG/ML IV SOLN
INTRAVENOUS | Status: AC
  Filled 2015-09-09: qty 2

## 2015-09-09 MED ORDER — SODIUM CHLORIDE 0.9 % IV SOLN: INTRAVENOUS | Status: DC

## 2015-09-09 MED ORDER — SODIUM CHLORIDE 0.9% FLUSH: 3.0000 mL | INTRAVENOUS | Status: DC | PRN

## 2015-09-09 MED ORDER — FENTANYL CITRATE (PF) 100 MCG/2ML IJ SOLN
INTRAMUSCULAR | Status: AC
  Filled 2015-09-09: qty 2

## 2015-09-09 MED ORDER — LIDOCAINE HCL (PF) 1 % IJ SOLN
INTRAMUSCULAR | Status: DC | PRN
  Administered 2015-09-09: 08:00:00

## 2015-09-09 MED ORDER — IOPAMIDOL (ISOVUE-370) INJECTION 76%
INTRAVENOUS | Status: DC | PRN
  Administered 2015-09-09: 21 mL via INTRAVENOUS

## 2015-09-09 MED ORDER — HEPARIN (PORCINE) IN NACL 2-0.9 UNIT/ML-% IJ SOLN
INTRAMUSCULAR | Status: AC
  Filled 2015-09-09: qty 1000

## 2015-09-09 MED ORDER — FENTANYL CITRATE (PF) 100 MCG/2ML IJ SOLN
INTRAMUSCULAR | Status: DC | PRN
  Administered 2015-09-09 (×5): 25 ug via INTRAVENOUS

## 2015-09-09 MED ORDER — IOPAMIDOL (ISOVUE-370) INJECTION 76%
INTRAVENOUS | Status: AC
  Filled 2015-09-09: qty 50

## 2015-09-09 MED ORDER — IOPAMIDOL (ISOVUE-370) INJECTION 76%
INTRAVENOUS | Status: AC
Start: 2015-09-09 — End: 2015-09-09
  Filled 2015-09-09: qty 100

## 2015-09-09 MED ORDER — HEPARIN (PORCINE) IN NACL 2-0.9 UNIT/ML-% IJ SOLN
INTRAMUSCULAR | Status: DC | PRN
  Administered 2015-09-09: 10:00:00

## 2015-09-09 MED ORDER — MIDAZOLAM HCL 2 MG/2ML IJ SOLN
INTRAMUSCULAR | Status: AC
  Filled 2015-09-09: qty 2

## 2015-09-09 MED ORDER — ATORVASTATIN CALCIUM 40 MG PO TABS
40.0000 mg | ORAL_TABLET | Freq: Every day | ORAL | Status: DC
  Administered 2015-09-09: 40 mg via ORAL
  Filled 2015-09-09: qty 1

## 2015-09-09 MED ORDER — ACETAMINOPHEN 325 MG PO TABS
325.0000 mg | ORAL_TABLET | ORAL | Status: DC | PRN
Start: 2015-09-09 — End: 2015-09-09

## 2015-09-09 MED ORDER — HYDRALAZINE HCL 20 MG/ML IJ SOLN
INTRAMUSCULAR | Status: AC
  Filled 2015-09-09: qty 1

## 2015-09-09 MED ORDER — LIDOCAINE HCL (PF) 1 % IJ SOLN
INTRAMUSCULAR | Status: AC
  Filled 2015-09-09: qty 60

## 2015-09-09 MED ORDER — CHLORHEXIDINE GLUCONATE 4 % EX LIQD: 60.0000 mL | Freq: Once | CUTANEOUS | Status: DC

## 2015-09-09 MED ORDER — DEXTROSE 5 % IV SOLN
3.0000 g | INTRAVENOUS | Status: DC
  Filled 2015-09-09 (×2): qty 3000

## 2015-09-09 MED ORDER — SODIUM CHLORIDE 0.9 % IR SOLN
80.0000 mg | Status: AC
  Administered 2015-09-09: 80 mg
  Filled 2015-09-09: qty 2

## 2015-09-09 MED ORDER — MIDAZOLAM HCL 5 MG/5ML IJ SOLN
INTRAMUSCULAR | Status: AC
  Filled 2015-09-09: qty 5

## 2015-09-09 MED ORDER — SODIUM CHLORIDE 0.9% FLUSH
3.0000 mL | Freq: Two times a day (BID) | INTRAVENOUS | Status: DC
  Administered 2015-09-09: 3 mL via INTRAVENOUS

## 2015-09-09 MED ORDER — LIDOCAINE HCL (PF) 1 % IJ SOLN
INTRAMUSCULAR | Status: AC
Start: 2015-09-09 — End: 2015-09-09
  Filled 2015-09-09: qty 30

## 2015-09-09 MED ORDER — CEFAZOLIN SODIUM-DEXTROSE 2-4 GM/100ML-% IV SOLN
2.0000 g | Freq: Four times a day (QID) | INTRAVENOUS | Status: AC
  Administered 2015-09-09 – 2015-09-10 (×3): 2 g via INTRAVENOUS
  Filled 2015-09-09 (×4): qty 100

## 2015-09-09 MED ORDER — MIDAZOLAM HCL 2 MG/2ML IJ SOLN
INTRAMUSCULAR | Status: DC | PRN
  Administered 2015-09-09: 1 mg via INTRAVENOUS

## 2015-09-09 MED ORDER — HEPARIN SODIUM (PORCINE) 1000 UNIT/ML IJ SOLN
INTRAMUSCULAR | Status: AC
Start: 2015-09-09 — End: 2015-09-09
  Filled 2015-09-09: qty 1

## 2015-09-09 MED ORDER — HYDRALAZINE HCL 20 MG/ML IJ SOLN
INTRAMUSCULAR | Status: DC | PRN
  Administered 2015-09-09 (×2): 10 mg via INTRAVENOUS

## 2015-09-09 MED ORDER — SODIUM CHLORIDE 0.9 % IV SOLN: 250.0000 mL | INTRAVENOUS | Status: DC | PRN

## 2015-09-09 MED ORDER — LIDOCAINE HCL (PF) 1 % IJ SOLN
INTRAMUSCULAR | Status: AC
  Filled 2015-09-09: qty 30

## 2015-09-09 MED ORDER — HEPARIN SODIUM (PORCINE) 1000 UNIT/ML IJ SOLN
INTRAMUSCULAR | Status: DC | PRN
  Administered 2015-09-09: 6000 [IU] via INTRAVENOUS

## 2015-09-09 MED ORDER — CETYLPYRIDINIUM CHLORIDE 0.05 % MT LIQD
7.0000 mL | Freq: Two times a day (BID) | OROMUCOSAL | Status: DC
  Administered 2015-09-09: 7 mL via OROMUCOSAL

## 2015-09-09 MED ORDER — IOPAMIDOL (ISOVUE-370) INJECTION 76%
INTRAVENOUS | Status: DC | PRN
  Administered 2015-09-09: 80 mL

## 2015-09-09 MED ORDER — ONDANSETRON HCL 4 MG/2ML IJ SOLN: 4.0000 mg | Freq: Four times a day (QID) | INTRAMUSCULAR | Status: DC | PRN

## 2015-09-09 MED ORDER — CHLORHEXIDINE GLUCONATE 0.12 % MT SOLN
15.0000 mL | Freq: Two times a day (BID) | OROMUCOSAL | Status: DC
  Administered 2015-09-09: 15 mL via OROMUCOSAL

## 2015-09-09 MED ORDER — MIDAZOLAM HCL 5 MG/5ML IJ SOLN
INTRAMUSCULAR | Status: DC | PRN
  Administered 2015-09-09 (×2): 1 mg via INTRAVENOUS
  Administered 2015-09-09: 2 mg via INTRAVENOUS

## 2015-09-09 MED ORDER — HEPARIN SODIUM (PORCINE) 5000 UNIT/ML IJ SOLN
5000.0000 [IU] | Freq: Three times a day (TID) | INTRAMUSCULAR | Status: DC
  Administered 2015-09-10: 5000 [IU] via SUBCUTANEOUS
  Filled 2015-09-09: qty 1

## 2015-09-09 MED ORDER — LIDOCAINE HCL (PF) 1 % IJ SOLN
INTRAMUSCULAR | Status: DC | PRN
  Administered 2015-09-09: 45 mL

## 2015-09-09 SURGICAL SUPPLY — 17 items
ALLURE CRT PM3262 (Pacemaker) ×3 IMPLANT
CABLE SURGICAL S-101-97-12 (CABLE) ×6 IMPLANT
CATH CPS DIRECT 135 DS2C020 (CATHETERS) ×3 IMPLANT
CATH CPS DIRECT WD DS2C021 (CATHETERS) ×3 IMPLANT
CATH HEX JOSEPH 2-5-2 65CM 6F (CATHETERS) ×3 IMPLANT
KIT ESSENTIALS PG (KITS) ×3 IMPLANT
LEAD QUARTET 1458Q-86CM (Lead) ×3 IMPLANT
LEAD TENDRIL MRI 52CM LPA1200M (Lead) ×3 IMPLANT
LEAD TENDRIL MRI 58CM LPA1200M (Lead) ×3 IMPLANT
PACEMAKER ALLURE CRT (Pacemaker) ×1 IMPLANT
PAD DEFIB LIFELINK (PAD) ×3 IMPLANT
SHEATH CLASSIC 8F (SHEATH) ×6 IMPLANT
SHEATH CLASSIC 9.5F (SHEATH) ×3 IMPLANT
SLITTER UNIVERSAL DS2A003 (MISCELLANEOUS) ×3 IMPLANT
TRAY PACEMAKER INSERTION (PACKS) ×3 IMPLANT
WIRE ACUITY WHISPER EDS 4648 (WIRE) ×3 IMPLANT
WIRE HI TORQ VERSACORE-J 145CM (WIRE) ×3 IMPLANT

## 2015-09-09 SURGICAL SUPPLY — 11 items
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
## 2015-09-09 NOTE — Interval H&P Note (Signed)
History and Physical Interval Note:  09/09/2015 7:34 AM  Harold Robertson  has presented today for surgery, with the diagnosis of hb  The various methods of treatment have been discussed with the patient and family. After consideration of risks, benefits and other options for treatment, the patient has consented to  Procedure(s): Left Heart Cath and Coronary Angiography (N/A) as a surgical intervention .  The patient's history has been reviewed, patient examined, no change in status, stable for surgery.  I have reviewed the patient's chart and labs.  Questions were answered to the patient's satisfaction.    Cath Lab Visit (complete for each Cath Lab visit)  Clinical Evaluation Leading to the Procedure:   ACS: No.  Non-ACS:    Anginal Classification: No Symptoms  Anti-ischemic medical therapy: No Therapy  Non-Invasive Test Results: No non-invasive testing performed  Prior CABG: No previous CABG       Peter Swaziland MD,FACC 09/09/2015 7:35 AM

## 2015-09-09 NOTE — Progress Notes (Signed)
Spoke with Dr. Delton See regarding pacer not capturing. Increased ma to 10. Also, gave verbal order to increase by five ma, up to 20, if continues to fail to capture. Will continue to monitor closely.
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
## 2015-09-09 NOTE — Progress Notes (Addendum)
Site area: right groin Site Prior to Removal:  Level 0 Pressure Applied For:  10 minutes Manual:   yes Patient Status During Pull:  stable Post Pull Site:  Level  0 Post Pull Instructions Given:  yes Post Pull Pulses Present: yes Dressing Applied:  Small tegaderm Bedrest begins @  1300 Comments:  IV saline locked

## 2015-09-09 NOTE — Progress Notes (Signed)
Pt was received form the cath lab after procedure to return to the EP lab. Alert and oriented X4. IVF infusing.  Dr Elberta Fortis in to talk to pt about the EP procedure.

## 2015-09-10 ENCOUNTER — Inpatient Hospital Stay (HOSPITAL_COMMUNITY): Payer: 59

## 2015-09-10 MED ORDER — LISINOPRIL 40 MG PO TABS: 40.0000 mg | ORAL_TABLET | Freq: Every day | ORAL | Status: DC

## 2015-09-10 MED ORDER — CARVEDILOL 6.25 MG PO TABS: 6.2500 mg | ORAL_TABLET | Freq: Two times a day (BID) | ORAL | Status: DC

## 2015-09-10 MED ORDER — ATORVASTATIN CALCIUM 40 MG PO TABS: 40.0000 mg | ORAL_TABLET | Freq: Every day | ORAL | Status: DC

## 2015-09-10 MED FILL — Gentamicin Sulfate Inj 40 MG/ML: INTRAMUSCULAR | Qty: 80 | Status: AC

## 2015-09-10 NOTE — Discharge Instructions (Signed)
° ° °  Supplemental Discharge Instructions for  Pacemaker/Defibrillator Patients  Activity No heavy lifting or vigorous activity with your left/right arm for 6 to 8 weeks.  Do not raise your left/right arm above your head for one week.  Gradually raise your affected arm as drawn below.             09/13/15                      09/14/15                   09/15/15                  09/16/15 __  NO DRIVING for 1 week    ; you may begin driving on 7/90/24    .  WOUND CARE - Keep the wound area clean and dry.  Do not get this area wet for one week. No showers for one week; you may shower on 09/16/15    . - The tape/steri-strips on your wound will fall off; do not pull them off.  No bandage is needed on the site.  DO  NOT apply any creams, oils, or ointments to the wound area. - If you notice any drainage or discharge from the wound, any swelling or bruising at the site, or you develop a fever > 101? F after you are discharged home, call the office at once.  Special Instructions - You are still able to use cellular telephones; use the ear opposite the side where you have your pacemaker/defibrillator.  Avoid carrying your cellular phone near your device. - When traveling through airports, show security personnel your identification card to avoid being screened in the metal detectors.  Ask the security personnel to use the hand wand. - Avoid arc welding equipment, MRI testing (magnetic resonance imaging), TENS units (transcutaneous nerve stimulators).  Call the office for questions about other devices. - Avoid electrical appliances that are in poor condition or are not properly grounded. - Microwave ovens are safe to be near or to operate.  Additional information for defibrillator patients should your device go off: - If your device goes off ONCE and you feel fine afterward, notify the device clinic nurses. - If your device goes off ONCE and you do not feel well afterward, call 911. - If your device goes  off TWICE, call 911. - If your device goes off THREE times in one day, call 911.  DO NOT DRIVE YOURSELF OR A FAMILY MEMBER WITH A DEFIBRILLATOR TO THE HOSPITAL--CALL 911.     The office will call you regarding an appointment with Dr. Mayford Knife for your sleep apnea, if you have not heard from them in the next 3-4 business days, please call the office to follow up.

## 2015-09-10 NOTE — Progress Notes (Signed)
Pt as well as his wife at the bedside were given discharge instructions. Pt's IV was removed. CCMD was notified of pt being discharged & his tele box was removed. Pt given d/c instructions & edu. Sanda Linger, RN

## 2015-09-10 NOTE — Plan of Care (Signed)
Problem: Consults Goal: Skin Care Protocol Initiated - if Braden Score 18 or less If consults are not indicated, leave blank or document N/A  Outcome: Not Applicable Date Met:  79/44/46 braden > 18

## 2015-09-16 ENCOUNTER — Telehealth: Payer: Self-pay

## 2015-09-16 NOTE — Telephone Encounter (Signed)
Two voice mail messages have been left in reference to setting up appt with Dr.Turner

## 2015-09-20 ENCOUNTER — Ambulatory Visit (INDEPENDENT_AMBULATORY_CARE_PROVIDER_SITE_OTHER): Payer: 59 | Admitting: *Deleted

## 2015-09-20 ENCOUNTER — Encounter: Payer: Self-pay | Admitting: Cardiology

## 2015-09-20 ENCOUNTER — Telehealth: Payer: Self-pay | Admitting: Cardiology

## 2015-09-20 ENCOUNTER — Encounter: Payer: Self-pay | Admitting: *Deleted

## 2015-09-20 VITALS — BP 130/82 | HR 61

## 2015-09-20 DIAGNOSIS — I447 Left bundle-branch block, unspecified: Secondary | ICD-10-CM

## 2015-09-20 LAB — CUP PACEART INCLINIC DEVICE CHECK
Battery Remaining Longevity: 91.2
Battery Voltage: 3.02 V
Brady Statistic RV Percent Paced: 99.97 %
Date Time Interrogation Session: 20170626130041
Implantable Lead Implant Date: 20170615
Implantable Lead Implant Date: 20170615
Implantable Lead Location: 753860
Lead Channel Impedance Value: 550 Ohm
Lead Channel Impedance Value: 575 Ohm
Lead Channel Pacing Threshold Amplitude: 0.5 V
Lead Channel Pacing Threshold Amplitude: 0.75 V
Lead Channel Pacing Threshold Amplitude: 2.25 V
Lead Channel Pacing Threshold Pulse Width: 0.4 ms
Lead Channel Pacing Threshold Pulse Width: 0.4 ms
Lead Channel Setting Pacing Amplitude: 3.25 V
Lead Channel Setting Pacing Pulse Width: 0.4 ms
MDC IDC LEAD IMPLANT DT: 20170615
MDC IDC LEAD LOCATION: 753858
MDC IDC LEAD LOCATION: 753859
MDC IDC MSMT LEADCHNL LV IMPEDANCE VALUE: 612.5 Ohm
MDC IDC MSMT LEADCHNL LV PACING THRESHOLD PULSEWIDTH: 0.4 ms
MDC IDC MSMT LEADCHNL RA PACING THRESHOLD AMPLITUDE: 0.5 V
MDC IDC MSMT LEADCHNL RA PACING THRESHOLD PULSEWIDTH: 0.4 ms
MDC IDC MSMT LEADCHNL RA PACING THRESHOLD PULSEWIDTH: 0.4 ms
MDC IDC MSMT LEADCHNL RA SENSING INTR AMPL: 5 mV
MDC IDC MSMT LEADCHNL RV PACING THRESHOLD AMPLITUDE: 0.75 V
MDC IDC SET LEADCHNL RA PACING AMPLITUDE: 1.375
MDC IDC SET LEADCHNL RV PACING AMPLITUDE: 2 V
MDC IDC SET LEADCHNL RV PACING PULSEWIDTH: 0.4 ms
MDC IDC SET LEADCHNL RV SENSING SENSITIVITY: 5 mV
MDC IDC STAT BRADY RA PERCENT PACED: 85 %
Pulse Gen Model: 3262
Pulse Gen Serial Number: 7894586

## 2015-09-20 NOTE — Progress Notes (Signed)
Wound check appointment. Steri-strips removed. Wound without redness or edema. Incision edges approximated, wound well healed. Normal device function. RA and RV thresholds, sensing, and impedances consistent with implant measurements. LV threshold elevated since implant, autocapture on. Will re-evaluate at September appointment. Other vectors tested, no changes made due to MPP programming. He reports diaphragmatic stimulation occasionally while lying on left side or leaning forward, I have advised him to call if this worsens or becomes a nuisance. Device programmed with auto capture on for all leads. Histogram distribution appropriate for patient and level of activity. No mode switches or high ventricular rates noted. >99% BiV pacing. No thoracic impedance data at this time. Patient educated about wound care, arm mobility, lifting restrictions. ROV with WC 12/10/15.  Patient concerned about work restrictions. 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 I have given him a letter for work explaining his physical restrictions until 10/21/15 and that he is ok to return to driving at this time.

## 2015-09-20 NOTE — Telephone Encounter (Signed)
Patient needs return to Work note.

## 2015-10-12 ENCOUNTER — Telehealth: Payer: Self-pay | Admitting: Cardiology

## 2015-10-12 NOTE — Telephone Encounter (Signed)
[place] [employer] FMLA papers have been completed-Left VM for patient making him aware.

## 2015-11-18 ENCOUNTER — Encounter: Payer: Self-pay | Admitting: Physician Assistant

## 2015-12-02 ENCOUNTER — Ambulatory Visit (INDEPENDENT_AMBULATORY_CARE_PROVIDER_SITE_OTHER): Payer: 59 | Admitting: Physician Assistant

## 2015-12-02 ENCOUNTER — Encounter: Payer: Self-pay | Admitting: Physician Assistant

## 2015-12-02 ENCOUNTER — Encounter (INDEPENDENT_AMBULATORY_CARE_PROVIDER_SITE_OTHER): Payer: Self-pay

## 2015-12-02 VITALS — BP 122/90 | HR 73 | Ht 70.5 in | Wt 275.0 lb

## 2015-12-02 DIAGNOSIS — I442 Atrioventricular block, complete: Secondary | ICD-10-CM | POA: Diagnosis not present

## 2015-12-02 DIAGNOSIS — I10 Essential (primary) hypertension: Secondary | ICD-10-CM

## 2015-12-02 DIAGNOSIS — I429 Cardiomyopathy, unspecified: Secondary | ICD-10-CM

## 2015-12-02 LAB — BASIC METABOLIC PANEL
BUN: 26 mg/dL — ABNORMAL HIGH (ref 7–25)
CO2: 19 mmol/L — ABNORMAL LOW (ref 20–31)
Calcium: 9.3 mg/dL (ref 8.6–10.3)
Chloride: 106 mmol/L (ref 98–110)
Creat: 1 mg/dL (ref 0.70–1.25)
Glucose, Bld: 127 mg/dL — ABNORMAL HIGH (ref 65–99)
POTASSIUM: 5 mmol/L (ref 3.5–5.3)
SODIUM: 139 mmol/L (ref 135–146)

## 2015-12-02 LAB — HEPATIC FUNCTION PANEL
ALT: 18 U/L (ref 9–46)
AST: 23 U/L (ref 10–35)
Albumin: 4.2 g/dL (ref 3.6–5.1)
Alkaline Phosphatase: 70 U/L (ref 40–115)
BILIRUBIN INDIRECT: 1.4 mg/dL — AB (ref 0.2–1.2)
Bilirubin, Direct: 0.2 mg/dL (ref <=0.2)
TOTAL PROTEIN: 7 g/dL (ref 6.1–8.1)
Total Bilirubin: 1.6 mg/dL — ABNORMAL HIGH (ref 0.2–1.2)

## 2015-12-02 LAB — LIPID PANEL
CHOL/HDL RATIO: 4 ratio (ref <=5.0)
CHOLESTEROL: 190 mg/dL (ref 125–200)
HDL: 48 mg/dL (ref >=40)
LDL Cholesterol: 115 mg/dL (ref <130)
TRIGLYCERIDES: 133 mg/dL (ref <150)
VLDL: 27 mg/dL (ref <30)

## 2015-12-02 MED ORDER — CARVEDILOL 6.25 MG PO TABS: 6.2500 mg | ORAL_TABLET | Freq: Two times a day (BID) | ORAL | 3 refills | Status: DC

## 2015-12-02 MED ORDER — LISINOPRIL 40 MG PO TABS: 40.0000 mg | ORAL_TABLET | Freq: Every day | ORAL | 3 refills | Status: DC

## 2015-12-02 MED ORDER — ATORVASTATIN CALCIUM 40 MG PO TABS: 40.0000 mg | ORAL_TABLET | Freq: Every day | ORAL | 3 refills | Status: DC

## 2015-12-02 NOTE — Progress Notes (Addendum)
Cardiology Office Note Date:  12/02/2015  Patient ID:  Harold SalisburyJoseph A Robertson, DOB 02/12/1951, MRN [id number] PCP:  Harold SidleKurt Lauenstein, MD  Cardiologist:  Dr. Duke Robertson Electrophysiologist: Dr. Elberta Robertson   Chief Complaint:   History of Present Illness: Harold Robertson is a 65 y.o. male with history of HTN, obesity, OSA compliant with CPAP, CHB, NICM w/CRTp implant June 2017.  The patient comes in today to be seen for Dr. Elberta Robertson, post CRT-P implant, he reports feeling very well.  No CP of any kind, no palpitations or SOB, no exertional intolerances, no dizziness, near syncope or syncope.   Device information: SJM CRT-P, implanted 09/09/15, Dr. Elberta Robertson, CHB, NICM PACER DEPENDENT   Past Medical History:  Diagnosis Date  . Hypertension   . OSA on CPAP     Past Surgical History:  Procedure Laterality Date  . APPENDECTOMY  1960s  . CARDIAC CATHETERIZATION Right 09/08/2015   Procedure: Temporary Pacemaker;  Surgeon: Harold Jorja LoaMartin Camnitz, MD;  Location: [facility];  Service: Cardiovascular;  Laterality: Right;  . CARDIAC CATHETERIZATION N/A 09/09/2015   Procedure: Left Heart Cath and Coronary Angiography;  Surgeon: Harold M SwazilandJordan, MD;  [facility];  Service: Cardiovascular;  Laterality: N/A;  . EP IMPLANTABLE DEVICE N/A 09/09/2015   Procedure: BiV Pacemaker Insertion CRT-P;  Surgeon: Harold Jorja LoaMartin Camnitz, MD;  Location: [facility];  Service: Cardiovascular;  Laterality: N/A;    Current Outpatient Prescriptions  Medication Sig Dispense Refill  . aspirin 81 MG tablet Take 81 mg by mouth daily.    Marland Kitchen. atorvastatin (LIPITOR) 40 MG tablet Take 1 tablet (40 mg total) by mouth daily at 6 PM. 90 tablet 3  . carvedilol (COREG) 6.25 MG tablet Take 1 tablet (6.25 mg total) by mouth 2 (two) times daily with a meal. 180 tablet 3  . lisinopril (PRINIVIL,ZESTRIL) 40 MG tablet Take 1 tablet (40 mg total) by mouth daily. 90 tablet 3  . naproxen sodium (ANAPROX) 220 MG tablet Take  220 mg by mouth daily as needed (for pain). Reported on 09/08/2015     No current facility-administered medications for this visit.     Allergies:   Review of patient's allergies indicates no known allergies.   Social History:  The patient  reports that he has quit smoking. His smoking use included Cigars. He quit after 6.00 years of use. He uses smokeless tobacco. He reports that he drinks about 1.2 oz of alcohol per week . He reports that he uses drugs.   Family History:  The patient's family history includes Cancer (age of onset: 6761) in his mother; Heart disease in his maternal grandmother; Heart disease (age of onset: 2850) in his brother; Heart disease (age of onset: 4960) in his father; Liver cancer in his mother.  ROS:  Please see the history of present illness.  All other systems are reviewed and otherwise negative.   PHYSICAL EXAM:  VS:  BP 122/90   Pulse 73   Ht 5' 10.5" (1.791 m)   Wt 275 lb (124.7 kg)   BMI 38.90 kg/m  BMI: Body mass index is 38.9 kg/m. Well nourished, well developed, in no acute distress  HEENT: normocephalic, atraumatic  Neck: no JVD, carotid bruits or masses Cardiac:  RRR no significant murmurs, no rubs, or gallops Lungs:  clear to auscultation bilaterally, no wheezing, rhonchi or rales  Abd: soft, nontender MS: no deformity or atrophy Ext: no edema  Skin: warm and dry, no rash Neuro:  No gross deficits appreciated Psych: euthymic mood, full affect   PPM site is stable, well healed, no tethering or discomfort   EKG:  Done 09/11/15, SR, V paced Device interrogation today: normal device function, battery, leads status are good, no true AMS, brief episodes appear to be competatve pacing, 99% BIVE pacing, programming reviewed with Harold Robertson and appropriate.  1. 09/09/15: [facility], Dr. Swaziland    Conclusion     Prox RCA lesion, 20% stenosed.  Prox LAD to Mid LAD lesion, 40% stenosed.  Mid Cx lesion, 20% stenosed.  Ost 2nd Mrg to 2nd Mrg lesion, 20%  stenosed.  1st Diag lesion, 30% stenosed.  There is mild to moderate left ventricular systolic dysfunction.  1. Nonobstructive CAD 2. Mild to moderate LV dysfunction. EF 40-45%.  Plan: proceed with permanent pacemaker implant. Risk factor modification with statin therapy.   09/08/15: TTE Study Conclusions - Left ventricle: The cavity size was normal. There was mild   concentric hypertrophy. Systolic function was mildly to   moderately reduced. The estimated ejection fraction was in the   range of 40% to 45%. Diffuse hypokinesis. - Mitral valve: There was mild to moderate regurgitation. - Left atrium: The atrium was severely dilated. 25mm - Right ventricle: The cavity size was mildly dilated. Wall   thickness was normal. Systolic function was moderately reduced. - Right atrium: The atrium was severely dilated  Recent Labs: 09/08/2015: ALT 25; B Natriuretic Peptide 414.9; Magnesium 2.0; TSH 2.347 09/09/2015: BUN 11; Creatinine, Ser 1.07; Hemoglobin 15.4; Platelets 134; Potassium 4.0; Sodium 140  09/09/2015: Cholesterol 169; HDL 29; LDL Cholesterol 115; Total CHOL/HDL Ratio 5.8; Triglycerides 127; VLDL 25   CrCl cannot be calculated (Patient's most recent lab result is older than the maximum 21 days allowed.).   Wt Readings from Last 3 Encounters:  12/02/15 275 lb (124.7 kg)  09/10/15 291 lb (132 kg)  09/08/15 297 lb (134.7 kg)     Other studies reviewed: Additional studies/records reviewed today include: summarized above  ASSESSMENT AND PLAN:  1. CHB w/ PPM (CRT-P)     normal device function     No changes made  2. NICM      Compensated by exam, weight, corvue     No symptoms  3. HTN     Stable     Discussed weight loss, and he is asked to kleep an eye on his BP  4. Non-obstructive CAD     started statin in the hospital, Harold go ahead and checl LFTs, lipids    Disposition: F/u with remote check in 3 months, Dr. Elberta Fortis in 23months, sooner if needed.  Current  medicines are reviewed at length with the patient today.  The patient did not have any concerns regarding medicines.  Judith Blonder, PA-C 12/02/2015 9:48 AM     [facility] [address] [place] [address] [phone] (office)  [phone] (fax)

## 2015-12-02 NOTE — Patient Instructions (Signed)
Medication Instructions:   Your physician recommends that you continue on your current medications as directed. Please refer to the Current Medication list given to you today.   If you need a refill on your cardiac medications before your next appointment, please call your pharmacy.  Labwork: BMET LFT LIPIDS   Testing/Procedures: NONE ORDER TODAY    Follow-Up: Your physician wants you to follow-up in:  IN 6   MONTHS WITH DR Elberta Fortis  You will receive a reminder letter in the mail two months in advance. If you don't receive a letter, please call our office to schedule the follow-up appointment.   Remote monitoring is used to monitor your Pacemaker of ICD from home. This monitoring reduces the number of office visits required to check your device to one time per year. It allows [place] to keep an eye on the functioning of your device to ensure it is working properly. You are scheduled for a device check from home on .  03/02/16..You may send your transmission at any time that day. If you have a wireless device, the transmission will be sent automatically. After your physician reviews your transmission, you will receive a postcard with your next transmission date.    Any Other Special Instructions Will Be Listed Below (If Applicable).

## 2015-12-03 ENCOUNTER — Telehealth: Payer: Self-pay | Admitting: *Deleted

## 2015-12-03 NOTE — Telephone Encounter (Signed)
-----   Message from Renee Lynn Ursuy, PA-C sent at 12/03/2015 12:28 PM EDT ----- Please let the patient know his labs look OK.  LDL (bad cholesterol) could be better, please remind him to keep healthy diet, exercise and weight loss.  To recheck after these efforts at his next visit.  Liver enzymes are OK.  Thanks Renee 

## 2015-12-03 NOTE — Telephone Encounter (Signed)
LMOVM 2 CALL BACK

## 2015-12-06 ENCOUNTER — Telehealth: Payer: Self-pay | Admitting: *Deleted

## 2015-12-06 NOTE — Telephone Encounter (Signed)
-----   Message from Renee Lynn Ursuy, PA-C sent at 12/03/2015 12:28 PM EDT ----- Please let the patient know his labs look OK.  LDL (bad cholesterol) could be better, please remind him to keep healthy diet, exercise and weight loss.  To recheck after these efforts at his next visit.  Liver enzymes are OK.  Thanks Renee 

## 2015-12-06 NOTE — Telephone Encounter (Signed)
LMOVM TO CALL BACK FOR RESULTS 

## 2015-12-07 ENCOUNTER — Telehealth: Payer: Self-pay | Admitting: *Deleted

## 2015-12-07 ENCOUNTER — Telehealth: Payer: Self-pay | Admitting: Cardiology

## 2015-12-07 NOTE — Telephone Encounter (Signed)
New Message ° °Pt returning RN call about results. Please call back to discuss.  °

## 2015-12-07 NOTE — Telephone Encounter (Signed)
Routing to Edwena Blow covering assist, to call pt back.  She called patient yesterday and lm

## 2015-12-07 NOTE — Telephone Encounter (Signed)
LMOVM TO CALL BACK FOR RESULTS AND MENTION CURRENT LOCATION THAT  AT [facility] AND WILL RETURN TO [facility] TOMORROW. IF UN ABLE TO CONTACT ME.

## 2015-12-07 NOTE — Telephone Encounter (Signed)
SPOKE TO PT ABOUT RESULTS AND VERBALIZED UNDERSTANDING  

## 2015-12-07 NOTE — Telephone Encounter (Signed)
Pt returning our call °Please call back ° °

## 2015-12-07 NOTE — Telephone Encounter (Signed)
-----   Message from Sheilah Pigeon, [place] sent at 12/03/2015 12:28 PM EDT ----- Please let the patient know his labs look OK.  LDL (bad cholesterol) could be better, please remind him to keep healthy diet, exercise and weight loss.  To recheck after these efforts at his next visit.  Liver enzymes are OK.  Thanks [employer]

## 2015-12-07 NOTE — Telephone Encounter (Signed)
-----   Message from Renee Lynn Ursuy, PA-C sent at 12/03/2015 12:28 PM EDT ----- Please let the patient know his labs look OK.  LDL (bad cholesterol) could be better, please remind him to keep healthy diet, exercise and weight loss.  To recheck after these efforts at his next visit.  Liver enzymes are OK.  Thanks Renee 

## 2015-12-10 ENCOUNTER — Encounter: Payer: 59 | Admitting: Cardiology

## 2016-01-14 ENCOUNTER — Ambulatory Visit (INDEPENDENT_AMBULATORY_CARE_PROVIDER_SITE_OTHER): Payer: 59 | Admitting: Physician Assistant

## 2016-01-14 VITALS — BP 112/80 | HR 66 | Temp 98.1°F | Resp 17 | Ht 70.5 in | Wt 261.0 lb

## 2016-01-14 DIAGNOSIS — S76911A Strain of unspecified muscles, fascia and tendons at thigh level, right thigh, initial encounter: Secondary | ICD-10-CM

## 2016-01-14 DIAGNOSIS — Z23 Encounter for immunization: Secondary | ICD-10-CM | POA: Diagnosis not present

## 2016-01-14 MED ORDER — CYCLOBENZAPRINE HCL 10 MG PO TABS: 10.0000 mg | ORAL_TABLET | Freq: Three times a day (TID) | ORAL | 0 refills | Status: DC | PRN

## 2016-01-14 MED ORDER — MELOXICAM 7.5 MG PO TABS: 7.5000 mg | ORAL_TABLET | Freq: Every day | ORAL | 1 refills | Status: DC

## 2016-01-14 NOTE — Patient Instructions (Addendum)
[facility] to [facility] for thigh compression sleeve. This should help reduce your pain.  Mobic is an antiinflammatory. You may take up to TWO per day. Flexeril is a muscle relaxer. Please take these as directed.  Please see the below information concerning care for your muscle strain. As you heal, please perform the stretches below to help with full recovery.   Patient is to perform exercises below at 5 sets with 10 repetitions. Stretches are to be performed for 5 sets, 10 seconds each. Recommended she perform this rehab twice daily within pain tolerance for 2 weeks.    IF you received an x-ray today, you will receive an invoice from [facility]. Please contact [facility] at [phone] with questions or concerns regarding your invoice.   IF you received labwork today, you will receive an invoice from [facility]. Please contact [facility] at [phone] with questions or concerns regarding your invoice.   Our billing staff will not be able to assist you with questions regarding bills from these companies.  You will be contacted with the lab results as soon as they are available. The fastest way to get your results is to activate your My Chart account. Instructions are located on the last page of this paperwork. If you have not heard from us regarding the results in 2 weeks, please contact this office.     Adductor Muscle Strain With Rehab The adductor muscles of the thigh are responsible for moving the leg across the body and are susceptible to muscle strains. A strain is an injury to a muscle or a tendon that attaches the muscle to a bone. Strains of the adductor muscles occur where the muscle tendons attach to the pelvic bone. A muscle strain may be a complete or partial tear of the muscle and may involve one or more of the adductor muscles. These strains are usually classified as a grade 1 or 2 strain. A grade 1 strain has no obvious sign of  tearing or stretching of the muscle or tendon, but may include significant inflammation. A grade 2 strain is a moderate strain in which the muscle or tendon has been partially torn and has been stretched. Grade 2 strains are usually accompanied with loss of strength. A grade 3 muscle strain rarely occurs in the adductor muscles. A grade 3 strain is a complete tear of the muscle or tendon. SYMPTOMS   Occasionally there is a sudden "pop" felt or heard in the groin or inner thigh at the time of injury.  There may be pain, tenderness, swelling, warmth, or redness over the inner thigh and groin. This may be worsened by moving the hip (especially when spreading the legs or hips, pushing the legs against each other or kicking with the affected leg). There may be bruising (contusion) in the groin and inner thigh within 48 hours following the injury.  There may be loss of fullness of the muscle with complete rupture (uncommon).  Muscle spasm in the groin and inner thigh can occur. CAUSES   Prolonged overuse or a sudden increase in intensity, frequency, or duration of activity.  Single episode of stressful overactivity, such as during kicking.  Single violent blow or force to the inner thigh (less common). RISK INCREASES WITH:  Sports that require repeated kicking (soccer, martial arts, football), as well as sports that require the legs to be brought together (gymnastics, horseback riding).  Sports that require rapid acceleration (ice hockey, track and field).  Poor strength  and flexibility.  Previous thigh injury. PREVENTION   Warm up and stretch properly before activity.  Maintain physical fitness:  Hip and thigh flexibility.  Muscle strength and endurance.  Cardiovascular fitness.  Complete the entire course of rehabilitation after any lower extremity injury. Do this before returning to competition or practice. Follow suggestions of your caregiver. PROGNOSIS  If treated properly,  adductor muscle strains usually heal well within 2 to 6 weeks. RELATED COMPLICATIONS   Healing time will be prolonged if the condition is not appropriately treated. It needs adequate time to heal.  Do not return to activity too soon. Recurrence of symptoms and reinjury are possible.  If left untreated, the strain may progress to a complete tear (rare) or other injury caused by limping and favoring the injured leg.  Prolonged disability is possible. TREATMENT Treatment initially involves ice and medication to help reduce pain and inflammation. Strength and stretching exercises are recommended to maintain strength and a full range of motion. Strenuous activities should be modified to prevent further injury. Using crutches for the first few days may help to lessen pain. On rare occasions, surgery is necessary to reattach the tendon to the bone. If pain becomes persistent or chronic after more than 3 months of nonsurgical treatment, surgery may also be recommended. MEDICATION   If pain medication is necessary, nonsteroidal anti-inflammatory medications, such as aspirin and ibuprofen, or other minor pain relievers, such as acetaminophen, are often recommended.  Do not take pain medication for 7 days before surgery or as advised.  Prescription pain relievers may be given. Use only as directed and only as much as you need.  Ointments applied to the skin may be helpful.  Corticosteroid injections may be given to reduce inflammation. HEAT AND COLD  Cold treatment (icing) relieves pain and reduces inflammation. Cold treatment should be applied for 10 to 15 minutes every 2 to 3 hours for inflammation and pain and immediately after any activity that aggravates your symptoms. Use ice packs or an ice massage.  Heat treatment may be used prior to performing the stretching and strengthening activities prescribed by your caregiver, physical therapist, or athletic trainer. Use a heat pack or a warm  soak. SEEK MEDICAL CARE IF:   Symptoms get worse or do not improve in 2 weeks, despite treatment.  New, unexplained symptoms develop. (Drugs used in treatment may produce side effects.) EXERCISES  RANGE OF MOTION (ROM) AND STRETCHING EXERCISES - Adductor Muscle Strain These exercises may help you when beginning to rehabilitate your injury. Your symptoms may resolve with or without further involvement from your physician, physical therapist, or athletic trainer. While completing these exercises, remember:   Restoring tissue flexibility helps normal motion to return to the joints. This allows healthier, less painful movement and activity.  An effective stretch should be held for at least 30 seconds.  A stretch should never be painful. You should only feel a gentle lengthening or release in the stretched tissue. STRETCH - Adductors, Lunge  While standing, spread your legs.  Lean away from your right / left leg by bending your opposite knee. You may rest your hands on your thigh for balance.  You should feel a stretch in your right / left inner thigh. Hold for __________ seconds. Repeat __________ times. Complete this exercise __________ times per day.  STRETCH - Adductors, Standing  Place your right / left foot on a counter or stable table. Turn away from your leg so both hips line up with your right /  left leg.  Keeping your hips facing forward, slowly bend your opposite leg until you feel a gentle stretch on the inside of your right / left thigh.  Hold for __________ seconds. Repeat __________ times. Complete this exercise __________ times per day.  STRETCH - Hip Adductors, Sitting   Sit on the floor and place the bottoms of your feet together. Keep your chest up and look straight ahead to keep your back in proper alignment. Slide your feet in towards your body as far as you can without rounding your back or increasing any discomfort.  Gently push down on your knees until you feel a  gentle stretch in your inner thighs. Hold this position for __________ seconds. Repeat __________ times. Complete this exercise __________ times per day.  STRETCH - Hamstrings/Adductors, V-Sit  Sit on the floor with your legs extended in a large "V," keeping your knees straight.  With your head and chest upright, bend at your waist reaching for your left foot to stretch your right adductors.  You should feel a stretch in your right inner thigh. Hold for __________ seconds.  Return to the upright position to relax your leg muscles.  Continuing to keep your chest upright, bend straight forward at your waist to stretch your hamstrings.  You should feel a stretch behind both of your thighs and/or knees. Hold for __________ seconds.  Return to the upright position to relax your leg muscles.  Repeat steps 2 through 4 for the right leg to stretch your left inner thigh. Repeat __________ times. Complete this exercise __________ times per day.  STRENGTHENING EXERCISES - Adductor Muscle Strain These exercises may help you when beginning to rehabilitate your injury. They may resolve your symptoms with or without further involvement from your physician, physical therapist, or athletic trainer. While completing these exercises, remember:   Muscles can gain both the endurance and the strength needed for everyday activities through controlled exercises.  Complete these exercises as instructed by your physician, physical therapist, or athletic trainer. Progress the resistance and repetitions only as guided.  You may experience muscle soreness or fatigue, but the pain or discomfort you are trying to eliminate should never worsen during these exercises. If this pain does worsen, stop and make certain you are following the directions exactly. If the pain is still present after adjustments, discontinue the exercise until you can discuss the trouble with your caregiver. STRENGTH - Hip Adductors, Isometrics    Sit on a firm chair so that your knees are about the same height as your hips.  Place a large ball, firm pillow, or rolled up bath towel between your thighs.  Squeeze your thighs together, gradually building tension. Hold for __________ seconds.  Release the tension gradually and allow your inner thigh muscles to relax completely before repeating the exercise. Repeat __________ times. Complete this exercise __________ times per day.  STRENGTH - Hip Adductors, Straight Leg Raises   Lie on your side so that your head, shoulders, knee and hip line up. You may place your upper foot in front to help maintain your balance. Your right / left leg should be on the bottom.  Roll your hips slightly forward, so that your hips are stacked directly over each other and your right / left knee is facing forward.  Tense the muscles in your inner thigh and lift your bottom leg 4-6 inches. Hold this position for __________ seconds.  Slowly lower your leg to the starting position. Allow the muscles to fully relax  before beginning the next repetition. Repeat __________ times. Complete this exercise __________ times per day.    This information is not intended to replace advice given to you by your health care provider. Make sure you discuss any questions you have with your health care provider.   Document Released: 03/13/2005 Document Revised: 04/03/2014 Document Reviewed: 06/25/2008 Else[employer] Interactive Patient Education [employer]

## 2016-01-14 NOTE — Progress Notes (Signed)
Harold Robertson  MRN: [id number] DOB: Jan 05, 1951  PCP: Elvina Sidle, MD  Subjective:  Pt is a 66 year old male who presents to clinic for pulled groin muscle in right thigh x four days. 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 When he woke up, he felt pain in his upper inner right thigh. Pain does not radiate, worse with standing and moving his leg. Is having difficulty walking, uses crutches.  Has tried [employer] cream, helps some. He moves around a lot for his job and says he has been moving around a lot the past few days. His pain is increasing.   Review of Systems  Constitutional: Negative for chills, fatigue and fever.  Cardiovascular: Negative for chest pain and palpitations.  Musculoskeletal: Positive for gait problem and myalgias (right thigh). Negative for arthralgias, back pain and joint swelling.  Skin: Negative.   Neurological: Negative for tremors, weakness and numbness.    Patient Active Problem List   Diagnosis Date Noted  . Complete heart block (HCC) 09/08/2015  . Hypertension 07/17/2011  . Left bundle branch block 07/17/2011  . OBSTRUCTIVE SLEEP APNEA 10/25/2009    Current Outpatient Prescriptions on File Prior to Visit  Medication Sig Dispense Refill  . aspirin 81 MG tablet Take 81 mg by mouth daily.    Marland Kitchen atorvastatin (LIPITOR) 40 MG tablet Take 1 tablet (40 mg total) by mouth daily at 6 PM. 90 tablet 3  . carvedilol (COREG) 6.25 MG tablet Take 1 tablet (6.25 mg total) by mouth 2 (two) times daily with a meal. 180 tablet 3  . lisinopril (PRINIVIL,ZESTRIL) 40 MG tablet Take 1 tablet (40 mg total) by mouth daily. 90 tablet 3  . naproxen sodium (ANAPROX) 220 MG tablet Take 220 mg by mouth daily as needed (for pain). Reported on 09/08/2015     No current facility-administered medications on file prior to visit.     No Known Allergies  Objective:  BP 112/80 (BP Location: Right Arm, Patient Position: Sitting, Cuff Size: Large)    Pulse 66   Temp 98.1 F (36.7 C) (Oral)   Resp 17   Ht 5' 10.5" (1.791 m)   Wt 261 lb (118.4 kg)   SpO2 99%   BMI 36.92 kg/m   Physical Exam  Constitutional: He is oriented to person, place, and time and well-developed, well-nourished, and in no distress. No distress.  Cardiovascular: Normal rate, regular rhythm and normal heart sounds.   Pulmonary/Chest: Effort normal. No respiratory distress.  Musculoskeletal:  No swelling, erythema, deformity noted. Muscle seems to be in tact.  Tender with deep palpation at groin, lateral to genitals. Reduced ROM due to pain. Strength 5/5. Sensation in tact.   Neurological: He is alert and oriented to person, place, and time. GCS score is 15.  Skin: Skin is warm and dry.  Psychiatric: Mood, memory, affect and judgment normal.  Vitals reviewed.   Assessment and Plan :  1. Muscle strain of right thigh, initial encounter - meloxicam (MOBIC) 7.5 MG tablet; Take 1 tablet (7.5 mg total) by mouth daily.  Dispense: 30 tablet; Refill: 1 - cyclobenzaprine (FLEXERIL) 10 MG tablet; Take 1 tablet (10 mg total) by mouth 3 (three) times daily as needed for muscle spasms.  Dispense: 20 tablet; Refill: 0 - Supportive care discussed with patient. Encouraged him to buy a compression sleeve from [facility], rest his leg for a few days and use ice/heat as needed.  Information printed off for patient. Discussed with patient stretches he can do once his pain is under control. RTC if no improvement in 7-10 days. He understands and agrees with plan.   2. Need for prophylactic vaccination and inoculation against influenza 3. Need for prophylactic vaccination with combined diphtheria-tetanus-pertussis (DTP) vaccine - Flu Vaccine QUAD 36+ mos IM - Tdap vaccine greater than or equal to 7yo IM   Marco CollieWhitney Irfan Veal, PA-C  [facility] 01/14/2016 10:12 AM

## 2016-01-25 ENCOUNTER — Encounter (HOSPITAL_COMMUNITY): Payer: Self-pay | Admitting: Emergency Medicine

## 2016-01-25 ENCOUNTER — Inpatient Hospital Stay (HOSPITAL_COMMUNITY)
Admission: EM | Admit: 2016-01-25 | Discharge: 2016-01-28 | DRG: 872 | Disposition: A | Discharge status: Home / Self Care | Payer: 59 | Attending: Internal Medicine | Admitting: Internal Medicine

## 2016-01-25 DIAGNOSIS — N179 Acute kidney failure, unspecified: Secondary | ICD-10-CM | POA: Diagnosis present

## 2016-01-25 DIAGNOSIS — D649 Anemia, unspecified: Secondary | ICD-10-CM | POA: Diagnosis present

## 2016-01-25 DIAGNOSIS — E861 Hypovolemia: Secondary | ICD-10-CM | POA: Diagnosis present

## 2016-01-25 DIAGNOSIS — I442 Atrioventricular block, complete: Secondary | ICD-10-CM | POA: Diagnosis present

## 2016-01-25 DIAGNOSIS — Z791 Long term (current) use of non-steroidal anti-inflammatories (NSAID): Secondary | ICD-10-CM

## 2016-01-25 DIAGNOSIS — R4182 Altered mental status, unspecified: Secondary | ICD-10-CM

## 2016-01-25 DIAGNOSIS — I429 Cardiomyopathy, unspecified: Secondary | ICD-10-CM | POA: Diagnosis present

## 2016-01-25 DIAGNOSIS — Z9989 Dependence on other enabling machines and devices: Secondary | ICD-10-CM

## 2016-01-25 DIAGNOSIS — A419 Sepsis, unspecified organism: Principal | ICD-10-CM | POA: Diagnosis present

## 2016-01-25 DIAGNOSIS — Z8249 Family history of ischemic heart disease and other diseases of the circulatory system: Secondary | ICD-10-CM

## 2016-01-25 DIAGNOSIS — Z7982 Long term (current) use of aspirin: Secondary | ICD-10-CM

## 2016-01-25 DIAGNOSIS — I959 Hypotension, unspecified: Secondary | ICD-10-CM | POA: Diagnosis present

## 2016-01-25 DIAGNOSIS — G4733 Obstructive sleep apnea (adult) (pediatric): Secondary | ICD-10-CM | POA: Diagnosis present

## 2016-01-25 DIAGNOSIS — Z950 Presence of cardiac pacemaker: Secondary | ICD-10-CM

## 2016-01-25 DIAGNOSIS — N390 Urinary tract infection, site not specified: Secondary | ICD-10-CM | POA: Diagnosis present

## 2016-01-25 DIAGNOSIS — N3000 Acute cystitis without hematuria: Secondary | ICD-10-CM | POA: Diagnosis present

## 2016-01-25 DIAGNOSIS — Z79899 Other long term (current) drug therapy: Secondary | ICD-10-CM

## 2016-01-25 DIAGNOSIS — I10 Essential (primary) hypertension: Secondary | ICD-10-CM | POA: Diagnosis present

## 2016-01-25 NOTE — ED Notes (Signed)
Pt denies recent illness/hospital stay/antibiotics.

## 2016-01-25 NOTE — ED Triage Notes (Signed)
Per EMS, pt from home with c/o fever and AMS. Per family pt seemed "off" and "slow to respond". Pt's HR-130, and has a ventricular pacemaker. BP-132/79, SpO2-95% ra, T-103.5. Pt given 1,000mg  tylenol PTA.

## 2016-01-26 ENCOUNTER — Emergency Department (HOSPITAL_COMMUNITY): Payer: 59

## 2016-01-26 DIAGNOSIS — A419 Sepsis, unspecified organism: Principal | ICD-10-CM

## 2016-01-26 DIAGNOSIS — N3000 Acute cystitis without hematuria: Secondary | ICD-10-CM | POA: Diagnosis present

## 2016-01-26 DIAGNOSIS — E861 Hypovolemia: Secondary | ICD-10-CM | POA: Diagnosis present

## 2016-01-26 DIAGNOSIS — I959 Hypotension, unspecified: Secondary | ICD-10-CM

## 2016-01-26 DIAGNOSIS — Z8249 Family history of ischemic heart disease and other diseases of the circulatory system: Secondary | ICD-10-CM | POA: Diagnosis not present

## 2016-01-26 DIAGNOSIS — I429 Cardiomyopathy, unspecified: Secondary | ICD-10-CM | POA: Diagnosis present

## 2016-01-26 DIAGNOSIS — N390 Urinary tract infection, site not specified: Secondary | ICD-10-CM | POA: Diagnosis present

## 2016-01-26 DIAGNOSIS — Z7982 Long term (current) use of aspirin: Secondary | ICD-10-CM | POA: Diagnosis not present

## 2016-01-26 DIAGNOSIS — N179 Acute kidney failure, unspecified: Secondary | ICD-10-CM | POA: Diagnosis present

## 2016-01-26 DIAGNOSIS — D649 Anemia, unspecified: Secondary | ICD-10-CM | POA: Diagnosis present

## 2016-01-26 DIAGNOSIS — I442 Atrioventricular block, complete: Secondary | ICD-10-CM | POA: Diagnosis present

## 2016-01-26 DIAGNOSIS — Z79899 Other long term (current) drug therapy: Secondary | ICD-10-CM | POA: Diagnosis not present

## 2016-01-26 DIAGNOSIS — Z9989 Dependence on other enabling machines and devices: Secondary | ICD-10-CM | POA: Diagnosis not present

## 2016-01-26 DIAGNOSIS — G4733 Obstructive sleep apnea (adult) (pediatric): Secondary | ICD-10-CM | POA: Diagnosis present

## 2016-01-26 DIAGNOSIS — Z791 Long term (current) use of non-steroidal anti-inflammatories (NSAID): Secondary | ICD-10-CM | POA: Diagnosis not present

## 2016-01-26 DIAGNOSIS — Z950 Presence of cardiac pacemaker: Secondary | ICD-10-CM | POA: Diagnosis not present

## 2016-01-26 DIAGNOSIS — I10 Essential (primary) hypertension: Secondary | ICD-10-CM | POA: Diagnosis present

## 2016-01-26 LAB — CBC
HEMATOCRIT: 30.5 % — AB (ref 39.0–52.0)
Hemoglobin: 10.1 g/dL — ABNORMAL LOW (ref 13.0–17.0)
MCH: 28.9 pg (ref 26.0–34.0)
MCHC: 33.1 g/dL (ref 30.0–36.0)
MCV: 87.1 fL (ref 78.0–100.0)
Platelets: 210 10*3/uL (ref 150–400)
RBC: 3.5 MIL/uL — ABNORMAL LOW (ref 4.22–5.81)
RDW: 13.1 % (ref 11.5–15.5)
WBC: 14.3 10*3/uL — ABNORMAL HIGH (ref 4.0–10.5)

## 2016-01-26 LAB — CBC WITH DIFFERENTIAL/PLATELET
BASOS ABS: 0 10*3/uL (ref 0.0–0.1)
Basophils Relative: 0 %
EOS PCT: 0 %
Eosinophils Absolute: 0 10*3/uL (ref 0.0–0.7)
HCT: 35.3 % — ABNORMAL LOW (ref 39.0–52.0)
HEMOGLOBIN: 12.2 g/dL — AB (ref 13.0–17.0)
LYMPHS ABS: 0.3 10*3/uL — AB (ref 0.7–4.0)
LYMPHS PCT: 3 %
MCH: 29.7 pg (ref 26.0–34.0)
MCHC: 34.6 g/dL (ref 30.0–36.0)
MCV: 85.9 fL (ref 78.0–100.0)
Monocytes Absolute: 0.2 10*3/uL (ref 0.1–1.0)
Monocytes Relative: 2 %
NEUTROS PCT: 95 %
Neutro Abs: 9.7 10*3/uL — ABNORMAL HIGH (ref 1.7–7.7)
PLATELETS: 254 10*3/uL (ref 150–400)
RBC: 4.11 MIL/uL — AB (ref 4.22–5.81)
RDW: 13.2 % (ref 11.5–15.5)
WBC: 10.2 10*3/uL (ref 4.0–10.5)

## 2016-01-26 LAB — URINALYSIS, ROUTINE W REFLEX MICROSCOPIC
Bilirubin Urine: NEGATIVE
GLUCOSE, UA: NEGATIVE mg/dL
HGB URINE DIPSTICK: NEGATIVE
Ketones, ur: NEGATIVE mg/dL
Nitrite: NEGATIVE
PH: 6 (ref 5.0–8.0)
Protein, ur: 30 mg/dL — AB
Specific Gravity, Urine: 1.021 (ref 1.005–1.030)

## 2016-01-26 LAB — COMPREHENSIVE METABOLIC PANEL
ALK PHOS: 85 U/L (ref 38–126)
ALT: 37 U/L (ref 17–63)
AST: 42 U/L — AB (ref 15–41)
Albumin: 2.7 g/dL — ABNORMAL LOW (ref 3.5–5.0)
Anion gap: 9 (ref 5–15)
BUN: 16 mg/dL (ref 6–20)
CALCIUM: 8.6 mg/dL — AB (ref 8.9–10.3)
CHLORIDE: 104 mmol/L (ref 101–111)
CO2: 20 mmol/L — AB (ref 22–32)
CREATININE: 1.08 mg/dL (ref 0.61–1.24)
Glucose, Bld: 106 mg/dL — ABNORMAL HIGH (ref 65–99)
Potassium: 5.4 mmol/L — ABNORMAL HIGH (ref 3.5–5.1)
SODIUM: 133 mmol/L — AB (ref 135–145)
Total Bilirubin: 2.6 mg/dL — ABNORMAL HIGH (ref 0.3–1.2)
Total Protein: 6.7 g/dL (ref 6.5–8.1)

## 2016-01-26 LAB — URINE MICROSCOPIC-ADD ON: RBC / HPF: NONE SEEN RBC/hpf (ref 0–5)

## 2016-01-26 LAB — MRSA PCR SCREENING: MRSA by PCR: NEGATIVE

## 2016-01-26 LAB — BASIC METABOLIC PANEL
Anion gap: 8 (ref 5–15)
BUN: 18 mg/dL (ref 6–20)
CALCIUM: 7.8 mg/dL — AB (ref 8.9–10.3)
CO2: 19 mmol/L — AB (ref 22–32)
CREATININE: 1.38 mg/dL — AB (ref 0.61–1.24)
Chloride: 108 mmol/L (ref 101–111)
GFR calc non Af Amer: 53 mL/min — ABNORMAL LOW (ref >60)
Glucose, Bld: 114 mg/dL — ABNORMAL HIGH (ref 65–99)
Potassium: 3.8 mmol/L (ref 3.5–5.1)
Sodium: 135 mmol/L (ref 135–145)

## 2016-01-26 LAB — I-STAT CG4 LACTIC ACID, ED: Lactic Acid, Venous: 2.85 mmol/L (ref 0.5–1.9)

## 2016-01-26 LAB — LACTIC ACID, PLASMA: LACTIC ACID, VENOUS: 1.6 mmol/L (ref 0.5–1.9)

## 2016-01-26 LAB — INFLUENZA PANEL BY PCR (TYPE A & B)
Influenza A By PCR: NEGATIVE
Influenza B By PCR: NEGATIVE

## 2016-01-26 MED ORDER — ATORVASTATIN CALCIUM 40 MG PO TABS
40.0000 mg | ORAL_TABLET | Freq: Every day | ORAL | Status: DC
  Administered 2016-01-26 – 2016-01-27 (×2): 40 mg via ORAL
  Filled 2016-01-26 (×2): qty 1

## 2016-01-26 MED ORDER — SODIUM CHLORIDE 0.9% FLUSH
3.0000 mL | Freq: Two times a day (BID) | INTRAVENOUS | Status: DC
  Administered 2016-01-26 – 2016-01-28 (×4): 3 mL via INTRAVENOUS

## 2016-01-26 MED ORDER — MELOXICAM 7.5 MG PO TABS: 7.5000 mg | ORAL_TABLET | Freq: Every day | ORAL | Status: DC

## 2016-01-26 MED ORDER — NAPROXEN 250 MG PO TABS
250.0000 mg | ORAL_TABLET | Freq: Every day | ORAL | Status: DC | PRN
  Filled 2016-01-26: qty 1

## 2016-01-26 MED ORDER — ASPIRIN 81 MG PO CHEW
81.0000 mg | CHEWABLE_TABLET | Freq: Every day | ORAL | Status: DC
  Administered 2016-01-26 – 2016-01-28 (×3): 81 mg via ORAL
  Filled 2016-01-26 (×3): qty 1

## 2016-01-26 MED ORDER — PIPERACILLIN-TAZOBACTAM 3.375 G IVPB 30 MIN
3.3750 g | Freq: Once | INTRAVENOUS | Status: AC
  Administered 2016-01-26: 3.375 g via INTRAVENOUS
  Filled 2016-01-26: qty 50

## 2016-01-26 MED ORDER — ACETAMINOPHEN 500 MG PO TABS
1000.0000 mg | ORAL_TABLET | Freq: Once | ORAL | Status: DC
  Filled 2016-01-26: qty 2

## 2016-01-26 MED ORDER — SODIUM CHLORIDE 0.9 % IV SOLN
INTRAVENOUS | Status: DC
  Administered 2016-01-26 – 2016-01-27 (×3): via INTRAVENOUS

## 2016-01-26 MED ORDER — ENOXAPARIN SODIUM 40 MG/0.4ML ~~LOC~~ SOLN
40.0000 mg | Freq: Every day | SUBCUTANEOUS | Status: DC
  Administered 2016-01-26 – 2016-01-28 (×3): 40 mg via SUBCUTANEOUS
  Filled 2016-01-26 (×3): qty 0.4

## 2016-01-26 MED ORDER — SODIUM CHLORIDE 0.9 % IV BOLUS (SEPSIS)
2000.0000 mL | Freq: Once | INTRAVENOUS | Status: AC
Start: 2016-01-26 — End: 2016-01-26
  Administered 2016-01-26: 2000 mL via INTRAVENOUS

## 2016-01-26 MED ORDER — VANCOMYCIN HCL 10 G IV SOLR: 1500.0000 mg | Freq: Once | INTRAVENOUS | Status: DC

## 2016-01-26 MED ORDER — NAPROXEN SODIUM 220 MG PO TABS: 220.0000 mg | ORAL_TABLET | Freq: Every day | ORAL | Status: DC | PRN

## 2016-01-26 MED ORDER — CYCLOBENZAPRINE HCL 10 MG PO TABS
10.0000 mg | ORAL_TABLET | Freq: Three times a day (TID) | ORAL | Status: DC | PRN
  Administered 2016-01-26 – 2016-01-28 (×2): 10 mg via ORAL
  Filled 2016-01-26 (×2): qty 1

## 2016-01-26 MED ORDER — PIPERACILLIN-TAZOBACTAM 3.375 G IVPB
3.3750 g | Freq: Three times a day (TID) | INTRAVENOUS | Status: DC
  Administered 2016-01-26 – 2016-01-28 (×6): 3.375 g via INTRAVENOUS
  Filled 2016-01-26 (×10): qty 50

## 2016-01-26 MED ORDER — ACETAMINOPHEN 325 MG PO TABS: 650.0000 mg | ORAL_TABLET | Freq: Four times a day (QID) | ORAL | Status: DC | PRN

## 2016-01-26 MED ORDER — SODIUM CHLORIDE 0.9 % IV BOLUS (SEPSIS)
2000.0000 mL | Freq: Once | INTRAVENOUS | Status: AC
  Administered 2016-01-26: 2000 mL via INTRAVENOUS

## 2016-01-26 NOTE — Progress Notes (Signed)
Pharmacy Antibiotic Note  Harold Robertson is a 65 y.o. male admitted on 01/25/2016 with sepsis - likely urosepsis.  Pharmacy has been consulted for Zosyn dosing.  Zosyn 3.375gm IV given in ED ~0300  Plan: Zosyn 3.375gm IV q8h -  doses over 4 hours Will f/u micro data, renal function, and pt's clinical condition       No data recorded.   Recent Labs Lab 01/26/16 0001 01/26/16 0056 01/26/16 0200  WBC 10.2  --   --   CREATININE 1.08  --   --   LATICACIDVEN  --  2.85* 1.6    Estimated Creatinine Clearance: 89.8 mL/min (by C-G formula based on SCr of 1.08 mg/dL).    No Known Allergies  Antimicrobials this admission: 11/1 Zosyn >>  11/1 Vanc x 1  Dose adjustments this admission: n/a  Microbiology results: 11/1 BCx x2:  11/1 UCx:    Thank you for allowing pharmacy to be a part of this patient's care.  Christoper Fabian, PharmD, BCPS Clinical pharmacist, pager [phone] 01/26/2016 3:25 AM

## 2016-01-26 NOTE — Progress Notes (Signed)
Pt self administered his own Cpap tolerating well

## 2016-01-26 NOTE — H&P (Signed)
History and Physical    Harold IgoJoseph A Degracia WUJ:[id number] DOB: 09/17/1950 DOA: 01/25/2016   PCP: [name], MD Chief Complaint:  Chief Complaint  Patient presents with  . Fever  . Altered Mental Status    HPI: Harold Robertson is a 65 y.o. male with medical history significant of ventricular pacemaker, and HTN who presents to the Emergency Department via EMS with his wife for fever and AMS.  Wife states pt was found disoriented a few hours PTA by their daughter.  Wife states their daughter is a Engineer, civil (consulting)nurse and noted that the pt was tachycardic.  Pt reports associated chills since this afternoon and HA at this time.  He denies SOB, cough, congestion, rhinorrhea, nausea, vomiting, back pain, and abdominal pain. No h/o DVT/PE. Wife notes pt was fine earlier in the day. Pt has also been experiencing right sided groin pain x ~ 2-3 weeks. He was evaluated at an UC for this pain a few days after onset but notes while the pain has improved it is still present. He denies testicular pain/swelling/redness/skin changes.   ED Course: Patient is found to be septic and hypotensive.  Febrile and tachycardic initially.  IVF bolus is initiated.  Work up shows UA suspicious for UTI.  On further review with patient he does admit to me he has been having "trouble going to the bathroom" and suspects "his prostate might be the issue".  Review of Systems: As per HPI otherwise 10 point review of systems negative.    Past Medical History:  Diagnosis Date  . Hypertension   . OSA on CPAP     Past Surgical History:  Procedure Laterality Date  . APPENDECTOMY  1960s  . CARDIAC CATHETERIZATION Right 09/08/2015   Procedure: Temporary Pacemaker;  Surgeon: Will Jorja LoaMartin Camnitz, MD;  Location: [facility] INVASIVE CV LAB;  Service: Cardiovascular;  Laterality: Right;  . CARDIAC CATHETERIZATION N/A 09/09/2015   Procedure: Left Heart Cath and Coronary Angiography;  Surgeon: Peter M SwazilandJordan, MD;  [facility];   Service: Cardiovascular;  Laterality: N/A;  . EP IMPLANTABLE DEVICE N/A 09/09/2015   Procedure: BiV Pacemaker Insertion CRT-P;  Surgeon: Will Jorja LoaMartin Camnitz, MD;  Location: [facility] INVASIVE CV LAB;  Service: Cardiovascular;  Laterality: N/A;     reports that he has quit smoking. His smoking use included Cigars. He quit after 6.00 years of use. He quit smokeless tobacco use about a year ago. He reports that he drinks about 1.2 oz of alcohol per week . He reports that he uses drugs.  No Known Allergies  Family History  Problem Relation Age of Onset  . Heart disease Father [id number]    AMI  . Liver cancer Mother   . Cancer Mother [id number]    Colon cancer with liver mets  . Heart disease Brother 50    AMI  . Heart disease Maternal Grandmother       Prior to Admission medications   Medication Sig Start Date End Date Taking? Authorizing Provider  aspirin 81 MG tablet Take 81 mg by mouth daily.   Yes Historical Provider, MD  atorvastatin (LIPITOR) 40 MG tablet Take 1 tablet (40 mg total) by mouth daily at 6 PM. 12/02/15  Yes Will Jorja LoaMartin Camnitz, MD  carvedilol (COREG) 6.25 MG tablet Take 1 tablet (6.25 mg total) by mouth 2 (two) times daily with a meal. 12/02/15  Yes Will Jorja LoaMartin Camnitz, MD  cyclobenzaprine (FLEXERIL) 10 MG tablet Take 1 tablet (10 mg total) by mouth 3 (three)  times daily as needed for muscle spasms. 01/14/16  Yes Elizabeth Whitney McVey, PA-C  lisinopril (PRINIVIL,ZESTRIL) 40 MG tablet Take 1 tablet (40 mg total) by mouth daily. 12/02/15  Yes Will Jorja Loa, MD  meloxicam (MOBIC) 7.5 MG tablet Take 1 tablet (7.5 mg total) by mouth daily. 01/14/16  Yes Elizabeth Whitney McVey, PA-C  naproxen sodium (ANAPROX) 220 MG tablet Take 220 mg by mouth daily as needed (for pain). Reported on 09/08/2015   Yes Historical Provider, MD    Physical Exam: Vitals:   01/26/16 0130 01/26/16 0148 01/26/16 0200 01/26/16 0230  BP: 93/65 (!) 89/62 94/60 93/61   Pulse: 110 106 106 99  Resp: (!) 27 (!) 28 (!) 29 23   SpO2: 96% 95% 98% 96%      Constitutional: NAD, calm, comfortable Eyes: PERRL, lids and conjunctivae normal ENMT: Mucous membranes are moist. Posterior pharynx clear of any exudate or lesions.Normal dentition.  Neck: normal, supple, no masses, no thyromegaly Respiratory: clear to auscultation bilaterally, no wheezing, no crackles. Normal respiratory effort. No accessory muscle use.  Cardiovascular: Regular rate and rhythm, no murmurs / rubs / gallops. No extremity edema. 2+ pedal pulses. No carotid bruits. No chest wall tenderness, pacer site looks unremarkable Abdomen: no tenderness, no masses palpated. No hepatosplenomegaly. Bowel sounds positive.  Musculoskeletal: no clubbing / cyanosis. No joint deformity upper and lower extremities. Good ROM, no contractures. Normal muscle tone.  Skin: no rashes, lesions, ulcers, No foot ulcer. No induration, no erythema, or other signs of infection of groin or testes (no suggestion at all of [place]) Neurologic: CN 2-12 grossly intact. Sensation intact, DTR normal. Strength 5/5 in all 4.  Psychiatric: Normal judgment and insight. Alert and oriented x 3. Normal mood.    Labs on Admission: I have personally reviewed following labs and imaging studies  CBC:  Recent Labs Lab 01/26/16 0001  WBC 10.2  NEUTROABS 9.7*  HGB 12.2*  HCT 35.3*  MCV 85.9  PLT 254   Basic Metabolic Panel:  Recent Labs Lab 01/26/16 0001  NA 133*  K 5.4*  CL 104  CO2 20*  GLUCOSE 106*  BUN 16  CREATININE 1.08  CALCIUM 8.6*   GFR: Estimated Creatinine Clearance: 89.8 mL/min (by C-G formula based on SCr of 1.08 mg/dL). Liver Function Tests:  Recent Labs Lab 01/26/16 0001  AST 42*  ALT 37  ALKPHOS 85  BILITOT 2.6*  PROT 6.7  ALBUMIN 2.7*   No results for input(s): LIPASE, AMYLASE in the last 168 hours. No results for input(s): AMMONIA in the last 168 hours. Coagulation Profile: No results for input(s): INR, PROTIME in the last 168 hours. Cardiac  Enzymes: No results for input(s): CKTOTAL, CKMB, CKMBINDEX, TROPONINI in the last 168 hours. BNP (last 3 results) No results for input(s): PROBNP in the last 8760 hours. HbA1C: No results for input(s): HGBA1C in the last 72 hours. CBG: No results for input(s): GLUCAP in the last 168 hours. Lipid Profile: No results for input(s): CHOL, HDL, LDLCALC, TRIG, CHOLHDL, LDLDIRECT in the last 72 hours. Thyroid Function Tests: No results for input(s): TSH, T4TOTAL, FREET4, T3FREE, THYROIDAB in the last 72 hours. Anemia Panel: No results for input(s): VITAMINB12, FOLATE, FERRITIN, TIBC, IRON, RETICCTPCT in the last 72 hours. Urine analysis:    Component Value Date/Time   COLORURINE AMBER (A) 01/26/2016 0132   APPEARANCEUR CLOUDY (A) 01/26/2016 0132   LABSPEC 1.021 01/26/2016 0132   PHURINE 6.0 01/26/2016 0132   GLUCOSEU NEGATIVE 01/26/2016 0132   HGBUR NEGATIVE  01/26/2016 0132   BILIRUBINUR NEGATIVE 01/26/2016 0132   BILIRUBINUR small 08/03/2014 1517   KETONESUR NEGATIVE 01/26/2016 0132   PROTEINUR 30 (A) 01/26/2016 0132   UROBILINOGEN 1.0 08/03/2014 1517   NITRITE NEGATIVE 01/26/2016 0132   LEUKOCYTESUR MODERATE (A) 01/26/2016 0132   Sepsis Labs: @LABRCNTIP (procalcitonin:4,lacticidven:4) )No results found for this or any previous visit (from the past 240 hour(s)).   Radiological Exams on Admission: Dg Chest 2 View  Result Date: 01/26/2016 CLINICAL DATA:  65 y/o  M; fever and altered mental status. EXAM: CHEST  2 VIEW COMPARISON:  09/10/2015 chest radiograph FINDINGS: The heart size and mediastinal contours are within normal limits and stable. Three lead pacemaker noted. Both lungs are clear. The visualized skeletal structures are unremarkable. IMPRESSION: No active cardiopulmonary disease. Electronically Signed   By: Mitzi Hansen M.D.   On: 01/26/2016 01:26    EKG: Independently reviewed.  Assessment/Plan Principal Problem:   Sepsis associated hypotension (HCC) Active  Problems:   UTI (urinary tract infection)   Sepsis (HCC)    1. Sepsis associated hypotension - 1. Likely sources include UTI vs prostatitis 2. Putting patient on zosyn per pharm 3. Also got one dose of vanc in ED due to hypotension, if this dosent improve with initial IVF bolus then may wish to consider continuing vanc given severity of illness, otherwise he probably dosent need this given likely source is either UTI or prostatitis. 4. BCx pending 5. UCx pending 6. IVF bolus initiated in ED 7. Holding home BP meds 8. Tele monitor   DVT prophylaxis: Lovenox Code Status: Full Family Communication: Wife at bedside Consults called: None Admission status: Admit to inpatient   Hillary Bow DO [facility] Pager [phone] from 7PM-7AM  If 7AM-7PM, please contact the day physician for the patient [email or web address] Password [email or web address]  01/26/2016, 3:21 AM

## 2016-01-26 NOTE — ED Notes (Signed)
Lab called, need grey top on ice for lactic acid.

## 2016-01-26 NOTE — ED Provider Notes (Signed)
[facility]-EM[facility]ENCY DEPT Provider Note   CSN: [id number] Arrival date & time: 01/25/16  2336   By signing my name below, I, Harold Robertson, attest that this documentation has been prepared under the direction and in the presence of Harold BilisKevin Willard Madrigal, MD . Electronically Signed: Freida Busmaniana Robertson, Scribe. 01/26/2016. 12:53 AM.   History   Chief Complaint Chief Complaint  Patient presents with  . Fever  . Altered Mental Status   The history is provided by the patient. No language interpreter was used.     HPI Comments:  Harold Robertson is a 65 y.o. male with a history of ventricular pacemaker, and HTN who presents to the Emergency Department via EMS with his wife for fever and AMS. Wife states pt was found disoriented a few hours PTA by their daughter. Wife states their daughter is a Engineer, civil (consulting)nurse and noted that the pt was tachycardic. Pt reports associated chills since this afternoon and HA at this time. He denies SOB, cough, congestion, rhinorrhea, nausea, vomiting, back pain, and abdominal pain. No h/o DVT/PE. Wife notes pt was fine earlier in the day. Pt has also been experiencing right sided groin pain x ~ 2-3 weeks. He was evaluated at an UC for this pain a few days after onset but notes while the pain has improved it is still present. He denies testicular pain/swelling.   Past Medical History:  Diagnosis Date  . Hypertension   . OSA on CPAP     Patient Active Problem List   Diagnosis Date Noted  . Complete heart block (HCC) 09/08/2015  . Hypertension 07/17/2011  . Left bundle branch block 07/17/2011  . OBSTRUCTIVE SLEEP APNEA 10/25/2009    Past Surgical History:  Procedure Laterality Date  . APPENDECTOMY  1960s  . CARDIAC CATHETERIZATION Right 09/08/2015   Procedure: Temporary Pacemaker;  Surgeon: Will Jorja LoaMartin Camnitz, MD;  Location: [facility];  Service: Cardiovascular;  Laterality: Right;  . CARDIAC CATHETERIZATION N/A 09/09/2015   Procedure: Left Heart Cath and Coronary  Angiography;  Surgeon: Peter M SwazilandJordan, MD;  [facility];  Service: Cardiovascular;  Laterality: N/A;  . EP IMPLANTABLE DEVICE N/A 09/09/2015   Procedure: BiV Pacemaker Insertion CRT-P;  Surgeon: Will Jorja LoaMartin Camnitz, MD;  Location: [facility];  Service: Cardiovascular;  Laterality: N/A;       Home Medications    Prior to Admission medications   Medication Sig Start Date End Date Taking? Authorizing Provider  aspirin 81 MG tablet Take 81 mg by mouth daily.    Historical Provider, MD  atorvastatin (LIPITOR) 40 MG tablet Take 1 tablet (40 mg total) by mouth daily at 6 PM. 12/02/15   Will Jorja LoaMartin Camnitz, MD  carvedilol (COREG) 6.25 MG tablet Take 1 tablet (6.25 mg total) by mouth 2 (two) times daily with a meal. 12/02/15   Will Jorja LoaMartin Camnitz, MD  cyclobenzaprine (FLEXERIL) 10 MG tablet Take 1 tablet (10 mg total) by mouth 3 (three) times daily as needed for muscle spasms. 01/14/16   Elizabeth Whitney McVey, PA-C  lisinopril (PRINIVIL,ZESTRIL) 40 MG tablet Take 1 tablet (40 mg total) by mouth daily. 12/02/15   Will Jorja LoaMartin Camnitz, MD  meloxicam (MOBIC) 7.5 MG tablet Take 1 tablet (7.5 mg total) by mouth daily. 01/14/16   Elizabeth Whitney McVey, PA-C  naproxen sodium (ANAPROX) 220 MG tablet Take 220 mg by mouth daily as needed (for pain). Reported on 09/08/2015    Historical Provider, MD    Family History Family History  Problem  Relation Age of Onset  . Heart disease Father 37    AMI  . Liver cancer Mother   . Cancer Mother 88    Colon cancer with liver mets  . Heart disease Brother 50    AMI  . Heart disease Maternal Grandmother     Social History Social History  Substance Use Topics  . Smoking status: Former Smoker    Years: 6.00    Types: Cigars  . Smokeless tobacco: Former Neurosurgeon    Quit date: 01/14/2015  . Alcohol use 1.2 oz/week    2 Cans of beer per week     Allergies   Review of patient's allergies indicates no known allergies.   Review of  Systems Review of Systems 10 systems reviewed and all are negative for acute change except as noted in the HPI.   Physical Exam Updated Vital Signs BP 112/74   Pulse (!) 125   Resp (!) 33   SpO2 96%   Physical Exam  Constitutional: He is oriented to person, place, and time. He appears well-developed and well-nourished.  HENT:  Head: Normocephalic and atraumatic.  Eyes: EOM are normal.  Neck: Normal range of motion.  Cardiovascular: Regular rhythm, normal heart sounds and intact distal pulses.  Tachycardia present.   Pulmonary/Chest: Effort normal and breath sounds normal. No respiratory distress.  Abdominal: Soft. He exhibits no distension. There is no tenderness.  Genitourinary: Penis normal.  Genitourinary Comments: Normal penis and scrotum without tenderness or erythema  Normal perineum   Musculoskeletal: Normal range of motion.  No erythema or warmth to right groin; no lymphadenopathy No tenderness; no deformity of the right groin   Neurological: He is alert and oriented to person, place, and time.  Skin: Skin is warm and dry.  Psychiatric: He has a normal mood and affect. Judgment normal.  Nursing note and vitals reviewed.    ED Treatments / Results  DIAGNOSTIC STUDIES:  Oxygen Saturation is 99% on RA, normal by my interpretation.    COORDINATION OF CARE:  12:37 AM Discussed treatment plan with pt at bedside and pt agreed to plan.  Labs (all labs ordered are listed, but only abnormal results are displayed) Labs Reviewed  CBC WITH DIFFERENTIAL/PLATELET - Abnormal; Notable for the following:       Result Value   RBC 4.11 (*)    Hemoglobin 12.2 (*)    HCT 35.3 (*)    Neutro Abs 9.7 (*)    Lymphs Abs 0.3 (*)    All other components within normal limits  COMPREHENSIVE METABOLIC PANEL - Abnormal; Notable for the following:    Sodium 133 (*)    Potassium 5.4 (*)    CO2 20 (*)    Glucose, Bld 106 (*)    Calcium 8.6 (*)    Albumin 2.7 (*)    AST 42 (*)     Total Bilirubin 2.6 (*)    All other components within normal limits  URINALYSIS, ROUTINE W REFLEX MICROSCOPIC (NOT AT [facility]) - Abnormal; Notable for the following:    Color, Urine AMBER (*)    APPearance CLOUDY (*)    Protein, ur 30 (*)    Leukocytes, UA MODERATE (*)    All other components within normal limits  URINE MICROSCOPIC-ADD ON - Abnormal; Notable for the following:    Squamous Epithelial / LPF 0-5 (*)    Bacteria, UA MANY (*)    All other components within normal limits  I-STAT CG4 LACTIC ACID, ED - Abnormal;  Notable for the following:    Lactic Acid, Venous 2.85 (*)    All other components within normal limits  CULTURE, BLOOD (ROUTINE X 2)  CULTURE, BLOOD (ROUTINE X 2)  URINE CULTURE  LACTIC ACID, PLASMA  INFLUENZA PANEL BY PCR (TYPE A & B, H1N1)    EKG  EKG Interpretation  Date/Time:  Tuesday January 25 2016 23:57:48 EDT Ventricular Rate:  125 PR Interval:  176 QRS Duration: 114 QT Interval:  310 QTC Calculation: 447 R Axis:   -108 Text Interpretation:  Atrial-sensed ventricular-paced rhythm Abnormal ECG No significant change was found Confirmed by Alanna Storti  MD, Caryn Bee ([id number]) on 01/26/2016 12:48:59 AM       Radiology Dg Chest 2 View  Result Date: 01/26/2016 CLINICAL DATA:  65 y/o  M; fever and altered mental status. EXAM: CHEST  2 VIEW COMPARISON:  09/10/2015 chest radiograph FINDINGS: The heart size and mediastinal contours are within normal limits and stable. Three lead pacemaker noted. Both lungs are clear. The visualized skeletal structures are unremarkable. IMPRESSION: No active cardiopulmonary disease. Electronically Signed   By: Mitzi Hansen M.D.   On: 01/26/2016 01:26    Procedures Procedures (including critical care time)    +++++++++++++++++++++++++++++++++++++++++++++  CRITICAL CARE Performed by: Lyanne Co Total critical care time: 33 minutes Critical care time was exclusive of separately billable procedures and treating other  patients. Critical care was necessary to treat or prevent imminent or life-threatening deterioration. Critical care was time spent personally by me on the following activities: development of treatment plan with patient and/or surrogate as well as nursing, discussions with consultants, evaluation of patient's response to treatment, examination of patient, obtaining history from patient or surrogate, ordering and performing treatments and interventions, ordering and review of laboratory studies, ordering and review of radiographic studies, pulse oximetry and re-evaluation of patient's condition.  ++++++++++++++++++++++++++++++++++++++++++++++++++    Medications Ordered in ED Medications  piperacillin-tazobactam (ZOSYN) IVPB 3.375 g (not administered)  sodium chloride 0.9 % bolus 2,000 mL (not administered)  meloxicam (MOBIC) tablet 7.5 mg (not administered)  cyclobenzaprine (FLEXERIL) tablet 10 mg (not administered)  atorvastatin (LIPITOR) tablet 40 mg (not administered)  aspirin chewable tablet 81 mg (not administered)  naproxen (NAPROSYN) tablet 250 mg (not administered)  sodium chloride 0.9 % bolus 2,000 mL (2,000 mLs Intravenous New Bag/Given 01/26/16 0135)     Initial Impression / Assessment and Plan / ED Course  I have reviewed the triage vital signs and the nursing notes.  Pertinent labs & imaging results that were available during my care of the patient were reviewed by me and considered in my medical decision making (see chart for details).  Clinical Course   Patient with likely developing urosepsis.  Urine culture sent.  Started on IV Zosyn.  30 cc/kg bolus given.  Initially normotensive.  Develop some hypotension in the ER.  Lactate is clear.  Blood pressures improving with IV fluids.  Patient be admitted to stepdown unit with the hospitalist team.  Final Clinical Impressions(s) / ED Diagnoses   Final diagnoses:  Sepsis, due to unspecified organism (HCC)  Acute cystitis  without hematuria  Altered mental status, unspecified altered mental status type    New Prescriptions New Prescriptions   No medications on file   I personally performed the services described in this documentation, which was scribed in my presence. The recorded information has been reviewed and is accurate.        Harold Bilis, MD 01/26/16 [id number]

## 2016-01-26 NOTE — Progress Notes (Signed)
[facility] PROGRESS NOTE  Harold Robertson JYN:[id number] DOB: 09/22/1950 DOA: 01/25/2016  PCP: Harold SidleKurt Lauenstein, MD  Brief History/Interval Summary: 65 year old Caucasian male with a past medical history of pacemaker placement for complete heart block in June 2017, hypertension, nonischemic cardiomyopathy, obstructive sleep apnea, presented to the hospital with complaints of fever and altered mental status. Patient was found to have an abnormal UA. Concern was for a urinary tract infection or prostatitis. Patient was also noted to be hypotensive and there was evidence for sepsis. He was hospitalized for further management.  Reason for Visit: UTI with Sepsis  Consultants: None  Procedures: None  Antibiotics: Zosyn  Subjective/Interval History: Patient feels slightly better this morning. His wife is at the bedside. She states that he does look slightly better this morning. He denies any shortness of breath, abdominal pain, nausea, vomiting. No diarrhea. He did have discomfort with urination at home.  ROS: Denies any chest pain  Objective:  Vital Signs  Vitals:   01/26/16 0550 01/26/16 0600 01/26/16 0603 01/26/16 0800  BP:  (!) 85/54  (!) 93/52  Pulse:  81  68  Resp:  17  17  Temp: 98.2 F (36.8 C)   97.6 F (36.4 C)  TempSrc: Oral   Oral  SpO2:  98%  99%  Weight:   123.4 kg (272 lb 2 oz)   Height: 5\' 10"  (1.778 m)  5\' 10"  (1.778 m)     Intake/Output Summary (Last 24 hours) at 01/26/16 1209 Last data filed at 01/26/16 1021  Gross per 24 hour  Intake          2608.33 ml  Output              275 ml  Net          2333.33 ml   Filed Weights   01/26/16 0603  Weight: 123.4 kg (272 lb 2 oz)    General appearance: alert, cooperative, appears stated age, distracted and no distress Head: Normocephalic, without obvious abnormality, atraumatic Resp: clear to auscultation bilaterally Cardio: regular rate and rhythm, S1, S2 normal, no murmur, click, rub or gallop GI:  soft, non-tender; bowel sounds normal; no masses,  no organomegaly Extremities: extremities normal, atraumatic, no cyanosis or edema Neurologic: Awake and alert. Oriented 2. Cranial nerves 2-12 intact. Modest and equal bilateral upper and lower extremities.  Lab Results:  Data Reviewed: I have personally reviewed following labs and imaging studies  CBC:  Recent Labs Lab 01/26/16 0001 01/26/16 0346  WBC 10.2 14.3*  NEUTROABS 9.7*  --   HGB 12.2* 10.1*  HCT 35.3* 30.5*  MCV 85.9 87.1  PLT 254 210    Basic Metabolic Panel:  Recent Labs Lab 01/26/16 0001 01/26/16 0346  NA 133* 135  K 5.4* 3.8  CL 104 108  CO2 20* 19*  GLUCOSE 106* 114*  BUN 16 18  CREATININE 1.08 1.38*  CALCIUM 8.6* 7.8*    GFR: Estimated Creatinine Clearance: 71.3 mL/min (by C-G formula based on SCr of 1.38 mg/dL (H)).  Liver Function Tests:  Recent Labs Lab 01/26/16 0001  AST 42*  ALT 37  ALKPHOS 85  BILITOT 2.6*  PROT 6.7  ALBUMIN 2.7*     Recent Results (from the past 240 hour(s))  MRSA PCR Screening     Status: None   Collection Time: 01/26/16  6:00 AM  Result Value Ref Range Status   MRSA by PCR NEGATIVE NEGATIVE Final    Comment:  The GeneXpert MRSA Assay (FDA approved for NASAL specimens only), is one component of a comprehensive MRSA colonization surveillance program. It is not intended to diagnose MRSA infection nor to guide or monitor treatment for MRSA infections.       Radiology Studies: Dg Chest 2 View  Result Date: 01/26/2016 CLINICAL DATA:  65 y/o  M; fever and altered mental status. EXAM: CHEST  2 VIEW COMPARISON:  09/10/2015 chest radiograph FINDINGS: The heart size and mediastinal contours are within normal limits and stable. Three lead pacemaker noted. Both lungs are clear. The visualized skeletal structures are unremarkable. IMPRESSION: No active cardiopulmonary disease. Electronically Signed   By: Mitzi Hansen M.D.   On: 01/26/2016 01:26      Medications:  Scheduled: . aspirin  81 mg Oral Daily  . atorvastatin  40 mg Oral q1800  . enoxaparin (LOVENOX) injection  40 mg Subcutaneous Daily  . piperacillin-tazobactam (ZOSYN)  IV  3.375 g Intravenous Q8H  . sodium chloride flush  3 mL Intravenous Q12H   Continuous: . sodium chloride 125 mL/hr at 01/26/16 0532   KXF:[id number], cyclobenzaprine  Assessment/Plan:  Principal Problem:   Sepsis associated hypotension (HCC) Active Problems:   UTI (urinary tract infection)   Sepsis (HCC)    Sepsis secondary to urinary tract infection Patient's blood pressure remains low, although he is asymptomatic. MAP has improved. His heart rate is improved. Continue with IV fluids. Continue Zosyn for now until all the culture reports are available. Lactic level was initially elevated and with IV fluids it did improve.  Acute renal failure. There has been an increase in the creatinine level. Monitor urine output closely. Continue aggressive fluid resuscitation. Repeat labs tomorrow morning. Stop nephrotoxic agents.  History of obstructive sleep apnea.  Continue with CPAP.  History of nonischemic cardiomyopathy EF is about 40-45%. Patient is currently hypovolemic. Please see above. Monitor closely for signs of volume overload. Holding his blood pressure lowering agents.  Recent complete heart block status post pacemaker. Currently stable.  DVT Prophylaxis: Lovenox    Code Status: Full code  Family Communication: Discussed with the patient and his wife  Disposition Plan: Continue management as outlined above.    LOS: 0 days   [facility]  [facility] Pager [phone] 01/26/2016, 12:09 PM  If 7PM-7AM, please contact night-coverage at [email or web address], password [facility]

## 2016-01-27 DIAGNOSIS — N3000 Acute cystitis without hematuria: Secondary | ICD-10-CM

## 2016-01-27 LAB — BASIC METABOLIC PANEL
Anion gap: 6 (ref 5–15)
BUN: 11 mg/dL (ref 6–20)
CHLORIDE: 109 mmol/L (ref 101–111)
CO2: 22 mmol/L (ref 22–32)
Calcium: 8 mg/dL — ABNORMAL LOW (ref 8.9–10.3)
Creatinine, Ser: 0.9 mg/dL (ref 0.61–1.24)
GFR calc Af Amer: >60 mL/min (ref >60)
GFR calc non Af Amer: >60 mL/min (ref >60)
Glucose, Bld: 92 mg/dL (ref 65–99)
POTASSIUM: 3.9 mmol/L (ref 3.5–5.1)
SODIUM: 137 mmol/L (ref 135–145)

## 2016-01-27 LAB — CBC
HCT: 29.9 % — ABNORMAL LOW (ref 39.0–52.0)
HEMOGLOBIN: 9.8 g/dL — AB (ref 13.0–17.0)
MCH: 28 pg (ref 26.0–34.0)
MCHC: 32.8 g/dL (ref 30.0–36.0)
MCV: 85.4 fL (ref 78.0–100.0)
Platelets: 182 10*3/uL (ref 150–400)
RBC: 3.5 MIL/uL — AB (ref 4.22–5.81)
RDW: 13 % (ref 11.5–15.5)
WBC: 5.8 10*3/uL (ref 4.0–10.5)

## 2016-01-27 LAB — URINE CULTURE

## 2016-01-27 NOTE — Progress Notes (Signed)
Patient has home CPAP and is already wearing for the night. RT will continue to monitor.

## 2016-01-27 NOTE — Progress Notes (Addendum)
Pt transferred to [address] bed 27 per w/c by Nurse tech  with all belongings including own CPAP with charger, cell phone and charger and hearing aid case- has hearing aids in his ears

## 2016-01-27 NOTE — Progress Notes (Signed)
Called [address] [place] gave report to [facility]

## 2016-01-27 NOTE — Progress Notes (Signed)
Physical Therapy Evaluation Patient Details Name: Harold Robertson MRN: [id number] DOB: November 07, 1950 Today's Date: 01/27/2016   History of Present Illness  Patient was admitted with neuro-sepis. per nursing he had confusion and some mobility difficulty. He had a groin strain prior to coming to the hospital. he was walking with a cane. he felt like his mobility was improving. 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 PMH: low back pain.    Clinical Impression  Patient is lat his baseline mobility at this time. He is safe to ambulate with nursing. He feels like he is having less pain in his groin then before. He was able to ambulate 150'. The patient would benefit most from out patient therapy for his groin. He reports it is improving but he would consider it if it did not get any better. D/C acute PT at this time.     Follow Up Recommendations Outpatient PT    Equipment Recommendations       Recommendations for Other Services       Precautions / Restrictions Precautions Precautions: None Restrictions Weight Bearing Restrictions: No      Mobility  Bed Mobility Overal bed mobility: Independent                Transfers Overall transfer level: Independent                  Ambulation/Gait Ambulation/Gait assistance: Supervision Ambulation Distance (Feet): 150 Feet         General Gait Details: Patient walked 30' without a device. He reports he feels like he strides better. He used the walker 2nd to a cane not being available and was able to ambulate 120 more feet. He reports his groin is feeling better.   Stairs            Wheelchair Mobility    Modified Rankin (Stroke Patients Only)       Balance Overall balance assessment: Modified Independent                                           Pertinent Vitals/Pain      Home Living Family/patient expects to be discharged to:: Private  residence Living Arrangements: Spouse/significant other               Additional Comments: Nothing limiting     Prior Function Level of Independence: Independent               Hand Dominance   Dominant Hand: Right    Extremity/Trunk Assessment   Upper Extremity Assessment: Overall WFL for tasks assessed           Lower Extremity Assessment: RLE deficits/detail RLE Deficits / Details: Pain with hip movement on the right        Communication   Communication: No difficulties  Cognition Arousal/Alertness: Awake/alert Behavior During Therapy: WFL for tasks assessed/performed Overall Cognitive Status: Within Functional Limits for tasks assessed       Memory: Decreased recall of precautions              General Comments  Pain: 5/10 pain in the groin with ambulation       Exercises     Assessment/Plan    PT Assessment All further PT needs can be met in the next venue of care  PT Problem  List            PT Treatment Interventions      PT Goals (Current goals can be found in the Care Plan section)  Acute Rehab PT Goals Patient Stated Goal: to go home PT Goal Formulation: With patient Time For Goal Achievement: 02/10/16 Potential to Achieve Goals: Good    Frequency     Barriers to discharge        Co-evaluation               End of Session Equipment Utilized During Treatment: Gait belt Activity Tolerance: Patient tolerated treatment well Patient left: in bed;with call bell/phone within reach Nurse Communication: Mobility status         Time: 1140-1200 PT Time Calculation (min) (ACUTE ONLY): 20 min   Charges:   PT Evaluation $PT Eval Low Complexity: 1 Procedure     PT G Codes:        Carney Living PT DPT  01/27/2016, 1:28 PM

## 2016-01-27 NOTE — Progress Notes (Signed)
[facility] PROGRESS NOTE  Harold Robertson QPY:[id number] DOB: 1950-06-06 DOA: 01/25/2016  PCP: Elvina Sidle, MD  Brief History/Interval Summary: 65 year old Caucasian male with a past medical history of pacemaker placement for complete heart block in June 2017, hypertension, nonischemic cardiomyopathy, obstructive sleep apnea, presented to the hospital with complaints of fever and altered mental status. Patient was found to have an abnormal UA. Concern was for a urinary tract infection or prostatitis. Patient was also noted to be hypotensive and there was evidence for sepsis. He was hospitalized for further management.  Reason for Visit: UTI with Sepsis  Consultants: None  Procedures: None  Antibiotics: Zosyn  Subjective/Interval History: Patient feels much better this morning. He denies any nausea or vomiting. Has a good appetite. Ate all his meals yesterday. Denies any dizziness or lightheadedness.   ROS: Denies any chest pain or shortness of breath  Objective:  Vital Signs  Vitals:   01/27/16 0400 01/27/16 0500 01/27/16 0600 01/27/16 0700  BP: 114/65 110/69 114/73 118/60  Pulse: 69 63 (!) 59 (!) 59  Resp:      Temp: 98.1 F (36.7 C)     TempSrc: Oral     SpO2: 98% 98% 99% 98%  Weight:      Height:        Intake/Output Summary (Last 24 hours) at 01/27/16 0756 Last data filed at 01/27/16 0753  Gross per 24 hour  Intake          3573.33 ml  Output             3650 ml  Net           -76.67 ml   Filed Weights   01/26/16 0603  Weight: 123.4 kg (272 lb 2 oz)    General appearance: alert, cooperative, appears stated age, no distress Resp: clear to auscultation bilaterally Cardio: regular rate and rhythm, S1, S2 normal, no murmur, click, rub or gallop GI: soft, non-tender; bowel sounds normal; no masses,  no organomegaly Extremities: extremities normal, atraumatic, no cyanosis or edema Neurologic: Awake and alert. Oriented 3. Cranial nerves 2-12  intact. Equal strength in bilateral upper and lower extremities.  Lab Results:  Data Reviewed: I have personally reviewed following labs and imaging studies  CBC:  Recent Labs Lab 01/26/16 0001 01/26/16 0346 01/27/16 0320  WBC 10.2 14.3* 5.8  NEUTROABS 9.7*  --   --   HGB 12.2* 10.1* 9.8*  HCT 35.3* 30.5* 29.9*  MCV 85.9 87.1 85.4  PLT 254 210 182    Basic Metabolic Panel:  Recent Labs Lab 01/26/16 0001 01/26/16 0346 01/27/16 0320  NA 133* 135 137  K 5.4* 3.8 3.9  CL 104 108 109  CO2 20* 19* 22  GLUCOSE 106* 114* 92  BUN 16 18 11   CREATININE 1.08 1.38* 0.90  CALCIUM 8.6* 7.8* 8.0*    GFR: Estimated Creatinine Clearance: 109.3 mL/min (by C-G formula based on SCr of 0.9 mg/dL).  Liver Function Tests:  Recent Labs Lab 01/26/16 0001  AST 42*  ALT 37  ALKPHOS 85  BILITOT 2.6*  PROT 6.7  ALBUMIN 2.7*     Recent Results (from the past 240 hour(s))  Blood culture (routine x 2)     Status: None (Preliminary result)   Collection Time: 01/26/16 12:11 AM  Result Value Ref Range Status   Specimen Description BLOOD RIGHT ARM  Final   Special Requests BOTTLES DRAWN AEROBIC AND ANAEROBIC  Final   Culture NO GROWTH 1 DAY  Final   Report Status PENDING  Incomplete  Blood culture (routine x 2)     Status: None (Preliminary result)   Collection Time: 01/26/16  1:49 AM  Result Value Ref Range Status   Specimen Description BLOOD RIGHT ARM  Final   Special Requests BOTTLES DRAWN AEROBIC AND ANAEROBIC 5ML  Final   Culture NO GROWTH 1 DAY  Final   Report Status PENDING  Incomplete  MRSA PCR Screening     Status: None   Collection Time: 01/26/16  6:00 AM  Result Value Ref Range Status   MRSA by PCR NEGATIVE NEGATIVE Final    Comment:        The GeneXpert MRSA Assay (FDA approved for NASAL specimens only), is one component of a comprehensive MRSA colonization surveillance program. It is not intended to diagnose MRSA infection nor to guide or monitor  treatment for MRSA infections.       Radiology Studies: Dg Chest 2 View  Result Date: 01/26/2016 CLINICAL DATA:  65 y/o  M; fever and altered mental status. EXAM: CHEST  2 VIEW COMPARISON:  09/10/2015 chest radiograph FINDINGS: The heart size and mediastinal contours are within normal limits and stable. Three lead pacemaker noted. Both lungs are clear. The visualized skeletal structures are unremarkable. IMPRESSION: No active cardiopulmonary disease. Electronically Signed   By: Mitzi HansenLance  Furusawa-Stratton M.D.   On: 01/26/2016 01:26     Medications:  Scheduled: . aspirin  81 mg Oral Daily  . atorvastatin  40 mg Oral q1800  . enoxaparin (LOVENOX) injection  40 mg Subcutaneous Daily  . piperacillin-tazobactam (ZOSYN)  IV  3.375 g Intravenous Q8H  . sodium chloride flush  3 mL Intravenous Q12H   Continuous: . sodium chloride 125 mL/hr at 01/27/16 0530   ZOX:[id number]:acetaminophen, cyclobenzaprine  Assessment/Plan:  Principal Problem:   Sepsis associated hypotension (HCC) Active Problems:   UTI (urinary tract infection)   Sepsis (HCC)    Sepsis secondary to urinary tract infection Patient has significantly improved. Blood pressures have stabilized. Unfortunately, urine culture showed multiple species. Recollection has been recommended. This will be ordered. Blood cultures negative so far. If blood cultures remain negative for today and then tomorrow he can be switched over to an oral antibiotic. Lactic level was initially elevated and with IV fluids it did improve.  Acute renal failure. Renal function has improved with hydration. Monitor urine output. Cutback on IV fluids. Continue to hold nephrotoxic agents.   History of obstructive sleep apnea.  Continue with CPAP.  History of nonischemic cardiomyopathy EF is about 40-45%. Patient is currently hypovolemic. Please see above. Monitor closely for signs of volume overload. Holding his blood pressure lowering agents.  Recent complete  heart block status post pacemaker. Currently stable. RN reported some beats without pacer spike and at other times spikes are noted. Telemetry reviewed. Pacemaker to be interrogated. EKG shows sinus rhythm with normal pacer spikes.  DVT Prophylaxis: Lovenox    Code Status: Full code  Family Communication: Discussed with the patient  Disposition Plan: Okay for transfer to telemetry.    LOS: 1 day   [facility]S Medical CenterKRISHNAN,Clinton Wahlberg  [facility] Pager [phone] 01/27/2016, 7:56 AM  If 7PM-7AM, please contact night-coverage at [email or web address], password [email or web address]

## 2016-01-27 NOTE — Progress Notes (Signed)
Patient arrived on floor shortly after 1800. Patient welcomed and made comfortable.  Dinner served.

## 2016-01-28 LAB — CBC
HCT: 30.8 % — ABNORMAL LOW (ref 39.0–52.0)
Hemoglobin: 10.2 g/dL — ABNORMAL LOW (ref 13.0–17.0)
MCH: 28.2 pg (ref 26.0–34.0)
MCHC: 33.1 g/dL (ref 30.0–36.0)
MCV: 85.1 fL (ref 78.0–100.0)
PLATELETS: 176 10*3/uL (ref 150–400)
RBC: 3.62 MIL/uL — AB (ref 4.22–5.81)
RDW: 12.8 % (ref 11.5–15.5)
WBC: 4.8 10*3/uL (ref 4.0–10.5)

## 2016-01-28 LAB — BASIC METABOLIC PANEL
ANION GAP: 6 (ref 5–15)
BUN: 7 mg/dL (ref 6–20)
CO2: 24 mmol/L (ref 22–32)
Calcium: 8.5 mg/dL — ABNORMAL LOW (ref 8.9–10.3)
Chloride: 106 mmol/L (ref 101–111)
Creatinine, Ser: 0.9 mg/dL (ref 0.61–1.24)
Glucose, Bld: 92 mg/dL (ref 65–99)
POTASSIUM: 3.7 mmol/L (ref 3.5–5.1)
SODIUM: 136 mmol/L (ref 135–145)

## 2016-01-28 LAB — URINE CULTURE: CULTURE: NO GROWTH

## 2016-01-28 MED ORDER — CEFPODOXIME PROXETIL 200 MG PO TABS: 200.0000 mg | ORAL_TABLET | Freq: Two times a day (BID) | ORAL | 0 refills | Status: AC

## 2016-01-28 MED ORDER — CEFPODOXIME PROXETIL 200 MG PO TABS
200.0000 mg | ORAL_TABLET | Freq: Two times a day (BID) | ORAL | Status: DC
  Administered 2016-01-28: 200 mg via ORAL
  Filled 2016-01-28: qty 1

## 2016-01-28 NOTE — Final Progress Note (Signed)
Patient discharge teaching given, including activity, diet, follow-up appoints, and medications. Patient verbalized understanding of all discharge instructions. IV access was d/c'd. Vitals are stable. Skin is intact except as charted in most recent assessments. Pt to be escorted out by NT, to be driven home by family. 

## 2016-01-28 NOTE — Discharge Summary (Signed)
[facility]  Physician Discharge Summary   Patient ID: Harold Robertson MRN: [id number] DOB/AGE: [birth year] 65 y.o.  Admit date: 01/25/2016 Discharge date: 01/28/2016  PCP: Elvina Sidle, MD  DISCHARGE DIAGNOSES:  Principal Problem:   Sepsis associated hypotension (HCC) Active Problems:   UTI (urinary tract infection)   Sepsis (HCC)   RECOMMENDATIONS FOR OUTPATIENT FOLLOW UP: 1. Outpatient follow-up with PCP 2. Repeat urine culture is pending   DISCHARGE CONDITION: fair  Diet recommendation: As before  [facility] Weights   01/26/16 0603 01/27/16 2121  Weight: 123.4 kg (272 lb 2 oz) 122.5 kg (270 lb)    INITIAL HISTORY: 65 year old Caucasian male with a past medical history of pacemaker placement for complete heart block in June 2017, hypertension, nonischemic cardiomyopathy, obstructive sleep apnea, presented to the hospital with complaints of fever and altered mental status. Patient was found to have an abnormal UA. Concern was for a urinary tract infection or prostatitis. Patient was also noted to be hypotensive and there was evidence for sepsis. He was hospitalized for further management.   HOSPITAL COURSE:   Sepsis secondary to urinary tract infection Patient was initially confused. Had elevated heart rate and low blood pressures. Patient was aggressively hydrated. He was placed on broad-spectrum antibiotics. Cultures were obtained. Blood cultures have been negative so far. Urine culture did not show any specific organism. Urine was recultured. However, results are not available at this time. In the meantime, patient has significantly improved. He was switched over to oral Vantin, which he tolerated. He will be discharged on the same. He should follow-up with his primary care physician.   Acute renal failure. Renal function has improved with hydration. This was secondary to sepsis and hypovolemia.  History of obstructive sleep apnea.  Continue with  CPAP.  History of nonischemic cardiomyopathy EF is about 40-45%. Due to sepsis and renal failure, he had to be hydrated. He has remained stable from a cardiovascular perspective without any evidence for fluid overload. Outpatient follow-up with cardiology.  Recent complete heart block status post pacemaker. Currently stable. RN reported some beats without pacer spike and at other times spikes are noted. Telemetry reviewed. Pacemaker was interrogated. Found to have normal function. Patient has an appointment with his cardiologist in December.   Normocytic anemia. Hemoglobin was low but stable. Likely dilutional drop. No evidence for overt bleeding.  Overall, stable and improved. He ambulated with physical therapy. Outpatient PT was suggested, which the patient declines at this time. He will discuss this with his primary care provider. Okay for discharge home today.   PERTINENT LABS:  The results of significant diagnostics from this hospitalization (including imaging, microbiology, ancillary and laboratory) are listed below for reference.    Microbiology: Recent Results (from the past 240 hour(s))  Blood culture (routine x 2)     Status: None (Preliminary result)   Collection Time: 01/26/16 12:11 AM  Result Value Ref Range Status   Specimen Description BLOOD RIGHT ARM  Final   Special Requests BOTTLES DRAWN AEROBIC AND ANAEROBIC  Final   Culture NO GROWTH 1 DAY  Final   Report Status PENDING  Incomplete  Blood culture (routine x 2)     Status: None (Preliminary result)   Collection Time: 01/26/16  1:49 AM  Result Value Ref Range Status   Specimen Description BLOOD RIGHT ARM  Final   Special Requests BOTTLES DRAWN AEROBIC AND ANAEROBIC  Final   Culture NO GROWTH 1 DAY  Final   Report Status PENDING  Incomplete  Urine culture     Status: Abnormal   Collection Time: 01/26/16  2:21 AM  Result Value Ref Range Status   Specimen Description URINE, RANDOM  Final   Special  Requests NONE  Final   Culture MULTIPLE SPECIES PRESENT, SUGGEST RECOLLECTION (A)  Final   Report Status 01/27/2016 FINAL  Final  MRSA PCR Screening     Status: None   Collection Time: 01/26/16  6:00 AM  Result Value Ref Range Status   MRSA by PCR NEGATIVE NEGATIVE Final    Comment:        The GeneXpert MRSA Assay (FDA approved for NASAL specimens only), is one component of a comprehensive MRSA colonization surveillance program. It is not intended to diagnose MRSA infection nor to guide or monitor treatment for MRSA infections.   Culture, Urine     Status: None   Collection Time: 01/27/16  5:26 PM  Result Value Ref Range Status   Specimen Description URINE, RANDOM  Final   Special Requests NONE  Final   Culture NO GROWTH  Final   Report Status 01/28/2016 FINAL  Final     Labs: Basic Metabolic Panel:  Recent Labs Lab 01/26/16 0001 01/26/16 0346 01/27/16 0320 01/28/16 0311  NA 133* 135 137 136  K 5.4* 3.8 3.9 3.7  CL 104 108 109 106  CO2 20* 19* 22 24  GLUCOSE 106* 114* 92 92  BUN 16 18 11 7   CREATININE 1.08 1.38* 0.90 0.90  CALCIUM 8.6* 7.8* 8.0* 8.5*   Liver Function Tests:  Recent Labs Lab 01/26/16 0001  AST 42*  ALT 37  ALKPHOS 85  BILITOT 2.6*  PROT 6.7  ALBUMIN 2.7*   CBC:  Recent Labs Lab 01/26/16 0001 01/26/16 0346 01/27/16 0320 01/28/16 0311  WBC 10.2 14.3* 5.8 4.8  NEUTROABS 9.7*  --   --   --   HGB 12.2* 10.1* 9.8* 10.2*  HCT 35.3* 30.5* 29.9* 30.8*  MCV 85.9 87.1 85.4 85.1  PLT 254 210 182 176     IMAGING STUDIES Dg Chest 2 View  Result Date: 01/26/2016 CLINICAL DATA:  65 y/o  M; fever and altered mental status. EXAM: CHEST  2 VIEW COMPARISON:  09/10/2015 chest radiograph FINDINGS: The heart size and mediastinal contours are within normal limits and stable. Three lead pacemaker noted. Both lungs are clear. The visualized skeletal structures are unremarkable. IMPRESSION: No active cardiopulmonary disease. Electronically Signed    By: Mitzi Hansen M.D.   On: 01/26/2016 01:26    DISCHARGE EXAMINATION: Vitals:   01/27/16 1800 01/27/16 2121 01/28/16 0513 01/28/16 0730  BP: 139/84 130/78 130/66 132/80  Pulse: 68 69 66 63  Resp: 18 18 18 20   Temp: 97.7 F (36.5 C) 98.9 F (37.2 C) 97.7 F (36.5 C) 98.7 F (37.1 C)  TempSrc: Oral Oral Oral Oral  SpO2: 100% 99% 99% 99%  Weight:  122.5 kg (270 lb)    Height: 5\' 10"  (1.778 m) 5\' 10"  (1.778 m)     General appearance: alert, cooperative, appears stated age and no distress Resp: clear to auscultation bilaterally Cardio: regular rate and rhythm, S1, S2 normal, no murmur, click, rub or gallop GI: soft, non-tender; bowel sounds normal; no masses,  no organomegaly Extremities: extremities normal, atraumatic, no cyanosis or edema Neurologic: Alert and oriented X 3, motor strength is equal bilateral upper and lower extremities.  DISPOSITION: Home with family  Discharge Instructions    Call MD for:  difficulty breathing, headache or visual  disturbances    Complete by:  As directed    Call MD for:  extreme fatigue    Complete by:  As directed    Call MD for:  persistant dizziness or light-headedness    Complete by:  As directed    Call MD for:  persistant nausea and vomiting    Complete by:  As directed    Call MD for:  severe uncontrolled pain    Complete by:  As directed    Call MD for:  temperature >100.4    Complete by:  As directed    Diet - low sodium heart healthy    Complete by:  As directed    Discharge instructions    Complete by:  As directed    May resume Lisinopril after 5 days. Please be sure to follow up with your PCP in 1 week. Please discuss with your primary care provider if you think you need physical therapy.  You were cared for by a hospitalist during your hospital stay. If you have any questions about your discharge medications or the care you received while you were in the hospital after you are discharged, you can call the unit  and asked to speak with the hospitalist on call if the hospitalist that took care of you is not available. Once you are discharged, your primary care physician will handle any further medical issues. Please note that NO REFILLS for any discharge medications will be authorized once you are discharged, as it is imperative that you return to your primary care physician (or establish a relationship with a primary care physician if you do not have one) for your aftercare needs so that they can reassess your need for medications and monitor your lab values. If you do not have a primary care physician, you can call [phone] for a physician referral.   Increase activity slowly    Complete by:  As directed       ALLERGIES: No Known Allergies   Discharge Medication List as of 01/28/2016  1:55 PM    START taking these medications   Details  cefpodoxime (VANTIN) 200 MG tablet Take 1 tablet (200 mg total) by mouth every 12 (twelve) hours., Starting Fri 01/28/2016, Until Wed 02/02/2016, Print      CONTINUE these medications which have NOT CHANGED   Details  aspirin 81 MG tablet Take 81 mg by mouth daily., Until Discontinued, Historical Med    atorvastatin (LIPITOR) 40 MG tablet Take 1 tablet (40 mg total) by mouth daily at 6 PM., Starting Thu 12/02/2015, Normal    carvedilol (COREG) 6.25 MG tablet Take 1 tablet (6.25 mg total) by mouth 2 (two) times daily with a meal., Starting Thu 12/02/2015, Normal    cyclobenzaprine (FLEXERIL) 10 MG tablet Take 1 tablet (10 mg total) by mouth 3 (three) times daily as needed for muscle spasms., Starting Fri 01/14/2016, Normal    lisinopril (PRINIVIL,ZESTRIL) 40 MG tablet Take 1 tablet (40 mg total) by mouth daily., Starting Thu 12/02/2015, Normal    meloxicam (MOBIC) 7.5 MG tablet Take 1 tablet (7.5 mg total) by mouth daily., Starting Fri 01/14/2016, Normal    naproxen sodium (ANAPROX) 220 MG tablet Take 220 mg by mouth daily as needed (for pain). Reported on 09/08/2015,  Historical Med         Follow-up Information    [name], MD. Schedule an appointment as soon as possible for a visit in 1 week(s).   Specialty:  Family Medicine Contact  information: [address] [place] [address] [phone]           TOTAL DISCHARGE TIME: 35 minutes  [facility]  [facility] Pager [phone]  01/28/2016, 3:15 PM

## 2016-01-28 NOTE — Care Management Note (Signed)
Case Management Note  Patient Details  Name: Harold Robertson MRN: [id number] Date of Birth: [birth year]  Subjective/Objective:                 Independent patient from home with wife admitted with sepsis. Declined OP PT at this time. No further CM needs at this time.   Action/Plan:  DC to home, self care.   Expected Discharge Date:                  Expected Discharge Plan:  Home/Self Care  In-House Referral:  NA  Discharge planning Services  CM Consult  Post Acute Care Choice:  NA Choice offered to:  NA  DME Arranged:    DME Agency:     HH Arranged:    HH Agency:     Status of Service:  Completed, signed off  If discussed at [employer] of Stay Meetings, dates discussed:    Additional Comments:  Lawerance Sabal, RN 01/28/2016, 1:16 PM

## 2016-01-31 LAB — CULTURE, BLOOD (ROUTINE X 2)
CULTURE: NO GROWTH
Culture: NO GROWTH

## 2016-03-01 ENCOUNTER — Ambulatory Visit (INDEPENDENT_AMBULATORY_CARE_PROVIDER_SITE_OTHER): Payer: 59 | Admitting: Physician Assistant

## 2016-03-01 ENCOUNTER — Ambulatory Visit (INDEPENDENT_AMBULATORY_CARE_PROVIDER_SITE_OTHER): Payer: 59

## 2016-03-01 ENCOUNTER — Encounter: Payer: Self-pay | Admitting: Physician Assistant

## 2016-03-01 ENCOUNTER — Telehealth: Payer: Self-pay | Admitting: Cardiology

## 2016-03-01 VITALS — BP 122/70 | HR 76 | Temp 98.1°F | Resp 20

## 2016-03-01 DIAGNOSIS — R300 Dysuria: Secondary | ICD-10-CM

## 2016-03-01 DIAGNOSIS — M1711 Unilateral primary osteoarthritis, right knee: Secondary | ICD-10-CM

## 2016-03-01 DIAGNOSIS — M25461 Effusion, right knee: Secondary | ICD-10-CM

## 2016-03-01 DIAGNOSIS — M25561 Pain in right knee: Secondary | ICD-10-CM

## 2016-03-01 LAB — POCT URINALYSIS DIP (MANUAL ENTRY)
BILIRUBIN UA: NEGATIVE
Blood, UA: NEGATIVE
GLUCOSE UA: NEGATIVE
Nitrite, UA: NEGATIVE
Protein Ur, POC: 30 — AB
SPEC GRAV UA: 1.025
Urobilinogen, UA: >=8
pH, UA: 5.5

## 2016-03-01 LAB — POCT CBC
GRANULOCYTE PERCENT: 80 % (ref 37–80)
HEMATOCRIT: 34.2 % — AB (ref 43.5–53.7)
Hemoglobin: 12.2 g/dL — AB (ref 14.1–18.1)
Lymph, poc: 1.3 (ref 0.6–3.4)
MCH, POC: 28 pg (ref 27–31.2)
MCHC: 35.6 g/dL — AB (ref 31.8–35.4)
MCV: 78.5 fL — AB (ref 80–97)
MID (CBC): 0.3 (ref 0–0.9)
MPV: 7.6 fL (ref 0–99.8)
POC GRANULOCYTE: 6.4 (ref 2–6.9)
POC LYMPH %: 16.4 % (ref 10–50)
POC MID %: 3.6 %M (ref 0–12)
Platelet Count, POC: 365 10*3/uL (ref 142–424)
RBC: 4.36 M/uL — AB (ref 4.69–6.13)
RDW, POC: 14.9 %
WBC: 8 10*3/uL (ref 4.6–10.2)

## 2016-03-01 LAB — POC MICROSCOPIC URINALYSIS (UMFC): MUCUS RE: ABSENT

## 2016-03-01 MED ORDER — SULFAMETHOXAZOLE-TRIMETHOPRIM 800-160 MG PO TABS: 1.0000 | ORAL_TABLET | Freq: Two times a day (BID) | ORAL | 0 refills | Status: DC

## 2016-03-01 NOTE — Telephone Encounter (Signed)
Spoke with pts daughter, pts daughter stated that pts chest appeared to be "jumping". Pts daughter stated that she felt like her father was downplaying the symptoms. Informed pts daughter that it is possible that pt could be having diaphragmatic stim. Pts daughter stated that she was waiting on a call from the orthopedic office and stated that pt could possibly be admitted to the hospital d/t possible infection. Pt daughter agreeable to appointment with device clinic 12/7 at 10:00 to evaluate diaphragmatic stim. Pts daughter stated that she would call back if pt were to be admitted to hospital.

## 2016-03-01 NOTE — Patient Instructions (Addendum)
Please do not take the antibiotic until after you have the knee seen and likely drained. Your appointment is at 2pm.  Please be there at 1:45pm.  This is at [facility].     IF you received an x-ray today, you will receive an invoice from [facility]. Please contact [facility] at [phone] with questions or concerns regarding your invoice.   IF you received labwork today, you will receive an invoice from [employer]. Please contact [facility] at [phone] with questions or concerns regarding your invoice.   Our billing staff will not be able to assist you with questions regarding bills from these companies.  You will be contacted with the lab results as soon as they are available. The fastest way to get your results is to activate your My Chart account. Instructions are located on the last page of this paperwork. If you have not heard from [place] regarding the results in 2 weeks, please contact this office.

## 2016-03-01 NOTE — Telephone Encounter (Signed)
New message      Daughter want the doctor to know that she can see pts pacemaker jump/vibrate under his clothes.  She noticed it for a while.  Is this normal?
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
## 2016-03-02 ENCOUNTER — Ambulatory Visit (INDEPENDENT_AMBULATORY_CARE_PROVIDER_SITE_OTHER): Payer: 59 | Admitting: *Deleted

## 2016-03-02 DIAGNOSIS — I429 Cardiomyopathy, unspecified: Secondary | ICD-10-CM | POA: Diagnosis not present

## 2016-03-02 NOTE — Progress Notes (Signed)
Patient present with c/o diaphragmatic stimulation. Testing complete by industry. LV2 pulse amplitude lowered to 1.25V with 0.46ms pulse width. Multi point pacing delay 1 and 2 adjusted to 4ms. ROv with WC 05/2016

## 2016-03-03 ENCOUNTER — Telehealth: Payer: Self-pay

## 2016-03-03 NOTE — Telephone Encounter (Signed)
Pt is needing to talk with someone about what was done at his referring office the we sent him to about his knee  Best number [phone]

## 2016-03-04 ENCOUNTER — Telehealth: Payer: Self-pay | Admitting: Emergency Medicine

## 2016-03-04 NOTE — Telephone Encounter (Signed)
Pt was referred to [facility] and diagnosed with Pseudo- Gout Advised to f/u with PCP for gout treatment Transferred call to make f/u appt

## 2016-03-06 ENCOUNTER — Ambulatory Visit (INDEPENDENT_AMBULATORY_CARE_PROVIDER_SITE_OTHER): Payer: 59 | Admitting: Family Medicine

## 2016-03-06 VITALS — BP 122/78 | HR 60 | Ht 70.5 in

## 2016-03-06 DIAGNOSIS — M11261 Other chondrocalcinosis, right knee: Secondary | ICD-10-CM

## 2016-03-06 DIAGNOSIS — M25561 Pain in right knee: Secondary | ICD-10-CM | POA: Diagnosis not present

## 2016-03-06 LAB — URINE CULTURE

## 2016-03-06 MED ORDER — NAPROXEN 500 MG PO TABS: 500.0000 mg | ORAL_TABLET | Freq: Two times a day (BID) | ORAL | 0 refills | Status: DC

## 2016-03-06 NOTE — Patient Instructions (Addendum)
  Pseudogout is treated similarly as gout. I have printed a handout on guidelines and other information. Oftentimes if only one joint is involved, and injection as she received last week is the initial treatment. However I did prescribe naproxen sodium to be taken at the onset of symptoms that may lessen progression if this does occur.  If you do have recurrent symptoms, start the naproxen one pill twice per day with food. Stop that medication if any stomach upset. Additionally can ask your cardiologist and make sure they are okay with you taking that medicine on an as-needed basis only.  Return to the clinic or go to the nearest emergency room if any of your symptoms worsen or new symptoms occur.   IF you received an x-ray today, you will receive an invoice from [facility]. Please contact [facility] at [phone] with questions or concerns regarding your invoice.   IF you received labwork today, you will receive an invoice from [employer]. Please contact [facility] at [phone] with questions or concerns regarding your invoice.   Our billing staff will not be able to assist you with questions regarding bills from these companies.  You will be contacted with the lab results as soon as they are available. The fastest way to get your results is to activate your My Chart account. Instructions are located on the last page of this paperwork. If you have not heard from [place] regarding the results in 2 weeks, please contact this office.
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
## 2016-03-06 NOTE — Telephone Encounter (Signed)
Addendum: contacted by pharmacy for hypernatremia of both lisinopril and bactrim.  Switched to keflex and doxycycline 03/01/16.

## 2016-03-13 ENCOUNTER — Other Ambulatory Visit: Payer: Self-pay | Admitting: *Deleted

## 2016-03-13 ENCOUNTER — Telehealth: Payer: Self-pay

## 2016-03-13 DIAGNOSIS — M11261 Other chondrocalcinosis, right knee: Secondary | ICD-10-CM

## 2016-03-13 MED ORDER — NAPROXEN 500 MG PO TABS: 500.0000 mg | ORAL_TABLET | Freq: Two times a day (BID) | ORAL | 0 refills | Status: DC

## 2016-03-13 NOTE — Telephone Encounter (Signed)
Pt is checking on status of a medication being called in for his gout  Best number 11/18/1950

## 2016-03-13 NOTE — Telephone Encounter (Signed)
Patient was called and stated he never received the naproxen medication for pain.  After researching, the medication was sent to [facility] mail service.  I advised the patient of this and he indicated [facility] use [facility].  I apologize to the patient and [facility] on [address].  I also advised patient to seek Consult from his Cardiologist regarding this medication. Patient understood.

## 2016-04-07 ENCOUNTER — Other Ambulatory Visit: Payer: Self-pay | Admitting: Cardiology

## 2016-05-03 ENCOUNTER — Other Ambulatory Visit: Payer: Self-pay | Admitting: Cardiology

## 2016-05-10 ENCOUNTER — Encounter: Payer: Self-pay | Admitting: Physician Assistant

## 2016-05-10 ENCOUNTER — Ambulatory Visit (INDEPENDENT_AMBULATORY_CARE_PROVIDER_SITE_OTHER): Payer: 59 | Admitting: Physician Assistant

## 2016-05-10 VITALS — BP 124/86 | HR 65 | Temp 97.8°F | Resp 16 | Wt 277.8 lb

## 2016-05-10 DIAGNOSIS — I10 Essential (primary) hypertension: Secondary | ICD-10-CM | POA: Diagnosis not present

## 2016-05-10 DIAGNOSIS — I442 Atrioventricular block, complete: Secondary | ICD-10-CM

## 2016-05-10 DIAGNOSIS — G4733 Obstructive sleep apnea (adult) (pediatric): Secondary | ICD-10-CM

## 2016-05-10 DIAGNOSIS — Z7689 Persons encountering health services in other specified circumstances: Secondary | ICD-10-CM

## 2016-05-10 DIAGNOSIS — Z950 Presence of cardiac pacemaker: Secondary | ICD-10-CM | POA: Insufficient documentation

## 2016-05-10 DIAGNOSIS — I519 Heart disease, unspecified: Secondary | ICD-10-CM | POA: Insufficient documentation

## 2016-05-10 NOTE — Progress Notes (Signed)
Patient ID: Harold Robertson MRN: [id number], DOB: 1951/03/16, 66 y.o. Date of Encounter: @DATE @  Chief Complaint:  Chief Complaint  Patient presents with  . New Patient (Initial Visit)    phq score 0    HPI: 66 y.o. year old male  presents as a New Patient to Establish Care.   He reports that he is seeing Dr. Renaye Rakers at [facility]. Is going to be having right total knee replacement. This has not been scheduled yet. Says they will be sending me a letter for preop evaluation. Says that they would not schedule surgery until he had seen a PCP.  He was going to the [place] urgent care. However has moved and now lives close to our office so wanted to establish PCP here. His last physical was performed there about 2 years ago. Since then he has gone there with just acute illnesses.  States that he has been out of work since December because of his knee. Was working as a Naval architect. Was having to work away from home/long distance driving. Says that he is almost 65 --after he recovers from the knee surgery he may retire or may just start working locally.  He has history of hypertension, obesity, OSA compliant with CPAP, complete heart block, nonischemic cardiomyopathy with pacemaker implant June 2017.  He had cardiac catheterization 09/09/15. This showed nonobstructive CAD. EF 40-45%. They then proceeded with permanent pacemaker implant. Planned for risk factor modification with statin therapy.  He states that these are all of his known chronic medical problems. No other chronic medical problems.   Past Medical History:  Diagnosis Date  . Hypertension   . OSA on CPAP      Home Meds: Outpatient Medications Prior to Visit  Medication Sig Dispense Refill  . aspirin 81 MG tablet Take 81 mg by mouth daily.    Marland Kitchen atorvastatin (LIPITOR) 40 MG tablet TAKE 1 TABLET BY MOUTH EVERY DAY AT 6PM 90 tablet 0  . carvedilol (COREG) 6.25 MG tablet TAKE 1 TABLET BY MOUTH TWICE A DAY  WITH A MEAL 180 tablet 0  . lisinopril (PRINIVIL,ZESTRIL) 40 MG tablet Take 1 tablet (40 mg total) by mouth daily. 90 tablet 3  . meloxicam (MOBIC) 7.5 MG tablet Take 1 tablet (7.5 mg total) by mouth daily. 30 tablet 1  . cyclobenzaprine (FLEXERIL) 10 MG tablet Take 1 tablet (10 mg total) by mouth 3 (three) times daily as needed for muscle spasms. (Patient not taking: Reported on 05/10/2016) 20 tablet 0  . doxycycline (VIBRAMYCIN) 100 MG capsule Take 100 mg by mouth 2 (two) times daily.    Marland Kitchen HYDROcodone-acetaminophen (NORCO) 10-325 MG tablet Take 1 tablet by mouth every 6 (six) hours as needed.    . naproxen (NAPROSYN) 500 MG tablet Take 1 tablet (500 mg total) by mouth 2 (two) times daily with a meal. (Patient not taking: Reported on 05/10/2016) 30 tablet 0  . naproxen sodium (ANAPROX) 220 MG tablet Take 220 mg by mouth daily as needed (for pain). Reported on 09/08/2015    . sulfamethoxazole-trimethoprim (BACTRIM DS,SEPTRA DS) 800-160 MG tablet Take 1 tablet by mouth 2 (two) times daily. (Patient not taking: Reported on 03/06/2016) 20 tablet 0   No facility-administered medications prior to visit.     Allergies: No Known Allergies  Social History   Social History  . Marital status: Married    Spouse name: N/A  . Number of children: N/A  . Years of education: N/A  Occupational History  . truck driver    Social History Main Topics  . Smoking status: Former Smoker    Years: 6.00    Types: Cigars  . Smokeless tobacco: Former Neurosurgeon    Quit date: 01/14/2015  . Alcohol use 1.2 oz/week    2 Cans of beer per week  . Drug use: Yes  . Sexual activity: Yes   Other Topics Concern  . Not on file   Social History Narrative   Marital status: Married x 30 years; first marriage     Children: 2 children; no grandchildren     Lives: lives with wife      Employment: truck driver x 14 years; [place].        Tobacco:  None; pipes and cigars; chews tobacco.       Alcohol:  Weekends.       Drugs:  None      Exercise:  Rarely.      Seatbelt:  100%      Guns:  Loaded and secured.       Family History  Problem Relation Age of Onset  . Heart disease Father 72    AMI  . Liver cancer Mother   . Cancer Mother 84    Colon cancer with liver mets  . Heart disease Brother 50    AMI  . Heart disease Maternal Grandmother      Review of Systems:  See HPI for pertinent ROS. All other ROS negative.    Physical Exam: Blood pressure 124/86, pulse 65, temperature 97.8 F (36.6 C), temperature source Oral, resp. rate 16, weight 277 lb 12.8 oz (126 kg), SpO2 99 %., Body mass index is 39.3 kg/m. General: Obese WM. Appears in no acute distress. Neck: Supple. No thyromegaly. No lymphadenopathy. Lungs: Clear bilaterally to auscultation without wheezes, rales, or rhonchi. Breathing is unlabored. Heart: RRR with S1 S2. No murmurs, rubs, or gallops. Abdomen: Soft, non-tender, non-distended with normoactive bowel sounds. No hepatomegaly. No rebound/guarding. No obvious abdominal masses. Musculoskeletal:  Strength and tone normal for age. Extremities/Skin: Warm and dry.  No edema.  Neuro: Alert and oriented X 3. Moves all extremities spontaneously. Gait is normal. CNII-XII grossly in tact. Psych:  Responds to questions appropriately with a normal affect.     ASSESSMENT AND PLAN:  66 y.o. year old male with  1. Encounter to establish care  2. Essential hypertension Blood Pressure is at goal/well controlled. Continue current medication.  3. Obstructive sleep apnea He is compliant with using CPAP.  4. Complete heart block (HCC) S/P Pacemaker implant. Managed by Cardiology.  5. Presence of permanent cardiac pacemaker S/P Pacemaker implant. Managed by Cardiology.  6. Left ventricular dysfunction Managed by cardiology. He is on Coreg and ACE inhibitor.  He is going to schedule complete physical exam for early morning and come fasting to that appointment. Will update preventative  care and fasting labs at that time.   Signed, [address] [place], [place], BSFM 05/10/2016 9:00 AM

## 2016-05-10 NOTE — Addendum Note (Signed)
Addended by: Allayne Butcher on: 05/10/2016 11:04 AM   Modules accepted: Orders

## 2016-05-11 ENCOUNTER — Encounter: Payer: Self-pay | Admitting: Physician Assistant

## 2016-05-11 ENCOUNTER — Ambulatory Visit (INDEPENDENT_AMBULATORY_CARE_PROVIDER_SITE_OTHER): Payer: 59 | Admitting: Physician Assistant

## 2016-05-11 VITALS — BP 122/84 | HR 67 | Temp 97.5°F | Resp 16 | Wt 273.6 lb

## 2016-05-11 DIAGNOSIS — Z23 Encounter for immunization: Secondary | ICD-10-CM

## 2016-05-11 DIAGNOSIS — Z0000 Encounter for general adult medical examination without abnormal findings: Secondary | ICD-10-CM | POA: Diagnosis not present

## 2016-05-11 LAB — TSH: TSH: 4.45 m[IU]/L (ref 0.40–4.50)

## 2016-05-11 LAB — LIPID PANEL
CHOL/HDL RATIO: 2.9 ratio (ref <5.0)
Cholesterol: 134 mg/dL (ref <200)
HDL: 46 mg/dL (ref >40)
LDL Cholesterol: 72 mg/dL (ref <100)
Triglycerides: 80 mg/dL (ref <150)
VLDL: 16 mg/dL (ref <30)

## 2016-05-11 LAB — CBC WITH DIFFERENTIAL/PLATELET
Basophils Absolute: 54 cells/uL (ref 0–200)
Basophils Relative: 1 %
EOS PCT: 8 %
Eosinophils Absolute: 432 cells/uL (ref 15–500)
HCT: 39.5 % (ref 38.5–50.0)
HEMOGLOBIN: 13.1 g/dL (ref 13.0–17.0)
LYMPHS ABS: 1836 {cells}/uL (ref 850–3900)
Lymphocytes Relative: 34 %
MCH: 27.5 pg (ref 27.0–33.0)
MCHC: 33.2 g/dL (ref 32.0–36.0)
MCV: 82.8 fL (ref 80.0–100.0)
MONOS PCT: 8 %
MPV: 10 fL (ref 7.5–12.5)
Monocytes Absolute: 432 cells/uL (ref 200–950)
NEUTROS ABS: 2646 {cells}/uL (ref 1500–7800)
Neutrophils Relative %: 49 %
PLATELETS: 184 10*3/uL (ref 140–400)
RBC: 4.77 MIL/uL (ref 4.20–5.80)
RDW: 17.7 % — ABNORMAL HIGH (ref 11.0–15.0)
WBC: 5.4 10*3/uL (ref 3.8–10.8)

## 2016-05-11 LAB — COMPLETE METABOLIC PANEL WITH GFR
ALBUMIN: 3.5 g/dL — AB (ref 3.6–5.1)
ALK PHOS: 56 U/L (ref 40–115)
ALT: 12 U/L (ref 9–46)
AST: 13 U/L (ref 10–35)
BUN: 15 mg/dL (ref 7–25)
CO2: 25 mmol/L (ref 20–31)
Calcium: 9.1 mg/dL (ref 8.6–10.3)
Chloride: 105 mmol/L (ref 98–110)
Creat: 0.97 mg/dL (ref 0.70–1.25)
GFR, Est African American: >89 mL/min (ref >=60)
GFR, Est Non African American: 82 mL/min (ref >=60)
GLUCOSE: 95 mg/dL (ref 70–99)
POTASSIUM: 4.3 mmol/L (ref 3.5–5.3)
SODIUM: 140 mmol/L (ref 135–146)
TOTAL PROTEIN: 6.7 g/dL (ref 6.1–8.1)
Total Bilirubin: 1.1 mg/dL (ref 0.2–1.2)

## 2016-05-11 LAB — PSA: PSA: 2.3 ng/mL (ref <=4.0)

## 2016-05-11 NOTE — Progress Notes (Signed)
Patient ID: Harold Robertson MRN: [id number], DOB: May 06, 1950 66 y.o. Date of Encounter: 05/11/2016, 9:29 AM    Chief Complaint: Physical (CPE)  HPI: 66 y.o. y/o male here for CPE.    05/10/2016: HPI: 66 y.o. year old male  presents as a New Patient to Establish Care.   He reports that he is seeing Dr. Renaye Rakers at [facility]. Is going to be having right total knee replacement. This has not been scheduled yet. Says they will be sending me a letter for preop evaluation. Says that they would not schedule surgery until he had seen a PCP.  He was going to the [place] urgent care. However has moved and now lives close to our office so wanted to establish PCP here. His last physical was performed there about 2 years ago. Since then he has gone there with just acute illnesses.  States that he has been out of work since December because of his knee. Was working as a Naval architect. Was having to work away from home/long distance driving. Says that he is almost 73 --after he recovers from the knee surgery he may retire or may just start working locally.  He has history of hypertension, obesity, OSA compliant with CPAP, complete heart block, nonischemic cardiomyopathy with pacemaker implant June 2017.  He had cardiac catheterization 09/09/15. This showed nonobstructive CAD. EF 40-45%. They then proceeded with permanent pacemaker implant. Planned for risk factor modification with statin therapy.  He states that these are all of his known chronic medical problems. No other chronic medical problems.   05/11/2016: He presents today for complete physical exam. Ortho will be sending me a note for preop clearance so we need to update preventive care and screening labs so presents for this today. Has no other concerns to address today.    Review of Systems: Consitutional: No fever, chills, fatigue, night sweats, lymphadenopathy, or weight changes. Eyes: No visual changes, eye  redness, or discharge. ENT/Mouth: Ears: No otalgia, tinnitus, hearing loss, discharge. Nose: No congestion, rhinorrhea, sinus pain, or epistaxis. Throat: No sore throat, post nasal drip, or teeth pain. Cardiovascular: No CP, palpitations, diaphoresis, DOE, edema, orthopnea, PND. Respiratory: No cough, hemoptysis, SOB, or wheezing. Gastrointestinal: No anorexia, dysphagia, reflux, pain, nausea, vomiting, hematemesis, diarrhea, constipation, BRBPR, or melena. Genitourinary: No dysuria, frequency, urgency, hematuria, incontinence, nocturia, decreased urinary stream, discharge, impotence, or testicular pain/masses. Musculoskeletal: Positive for pain in knee Skin: No rash, erythema, lesion changes, pain, warmth, jaundice, or pruritis. Neurological: No headache, dizziness, syncope, seizures, tremors, memory loss, coordination problems, or paresthesias. Psychological: No anxiety, depression, hallucinations, SI/HI. Endocrine: No fatigue, polydipsia, polyphagia, polyuria, or known diabetes. All other systems were reviewed and are otherwise negative.  Past Medical History:  Diagnosis Date  . Hypertension   . OSA on CPAP      Past Surgical History:  Procedure Laterality Date  . APPENDECTOMY  1960s  . CARDIAC CATHETERIZATION Right 09/08/2015   Procedure: Temporary Pacemaker;  Surgeon: Will Jorja Loa, MD;  Location: [facility] INVASIVE CV LAB;  Service: Cardiovascular;  Laterality: Right;  . CARDIAC CATHETERIZATION N/A 09/09/2015   Procedure: Left Heart Cath and Coronary Angiography;  Surgeon: Peter M Swaziland, MD;  Location: [facility] INVASIVE CV LAB;  Service: Cardiovascular;  Laterality: N/A;  . EP IMPLANTABLE DEVICE N/A 09/09/2015   Procedure: BiV Pacemaker Insertion CRT-P;  Surgeon: Will Jorja Loa, MD;  Location: [facility] INVASIVE CV LAB;  Service: Cardiovascular;  Laterality: N/A;    Home Meds:  Outpatient Medications  Prior to Visit  Medication Sig Dispense Refill  . aspirin 81 MG tablet Take 81 mg by  mouth daily.    Marland Kitchen atorvastatin (LIPITOR) 40 MG tablet TAKE 1 TABLET BY MOUTH EVERY DAY AT 6PM 90 tablet 0  . carvedilol (COREG) 6.25 MG tablet TAKE 1 TABLET BY MOUTH TWICE A DAY WITH A MEAL 180 tablet 0  . lisinopril (PRINIVIL,ZESTRIL) 40 MG tablet Take 1 tablet (40 mg total) by mouth daily. 90 tablet 3  . meloxicam (MOBIC) 7.5 MG tablet Take 1 tablet (7.5 mg total) by mouth daily. (Patient not taking: Reported on 05/11/2016) 30 tablet 1  . naproxen (NAPROSYN) 500 MG tablet Take 1 tablet (500 mg total) by mouth 2 (two) times daily with a meal. (Patient not taking: Reported on 05/10/2016) 30 tablet 0  . naproxen sodium (ANAPROX) 220 MG tablet Take 220 mg by mouth daily as needed (for pain). Reported on 09/08/2015     No facility-administered medications prior to visit.     Allergies: No Known Allergies  Social History   Social History  . Marital status: Married    Spouse name: N/A  . Number of children: N/A  . Years of education: N/A   Occupational History  . truck driver    Social History Main Topics  . Smoking status: Former Smoker    Years: 6.00    Types: Cigars  . Smokeless tobacco: Former Neurosurgeon    Quit date: 01/14/2015  . Alcohol use 1.2 oz/week    2 Cans of beer per week  . Drug use: Yes  . Sexual activity: Yes   Other Topics Concern  . Not on file   Social History Narrative   Marital status: Married x 30 years; first marriage     Children: 2 children; no grandchildren     Lives: lives with wife      Employment: truck driver x 14 years; [place].        Tobacco:  None; pipes and cigars; chews tobacco.       Alcohol:  Weekends.      Drugs:  None      Exercise:  Rarely.      Seatbelt:  100%      Guns:  Loaded and secured.       Family History  Problem Relation Age of Onset  . Heart disease Father 60    AMI  . Liver cancer Mother   . Cancer Mother 22    Colon cancer with liver mets  . Heart disease Brother 50    AMI  . Heart disease Maternal Grandmother       Physical Exam: Blood pressure 122/84, pulse 67, temperature 97.5 F (36.4 C), temperature source Oral, resp. rate 16, weight 273 lb 9.6 oz (124.1 kg), SpO2 98 %.  General: Obese WM. Appears in no acute distress. HEENT: Normocephalic, atraumatic. Conjunctiva pink, sclera non-icteric. Pupils 2 mm constricting to 1 mm, round, regular, and equally reactive to light and accomodation. EOMI. Internal auditory canal clear. TMs with good cone of light and without pathology. Nasal mucosa pink. Nares are without discharge. No sinus tenderness. Oral mucosa pink. Pharynx without exudate.   Neck: Supple. Trachea midline. No thyromegaly. Full ROM. No lymphadenopathy.No carotid bruits. Lungs: Clear to auscultation bilaterally without wheezes, rales, or rhonchi. Breathing is of normal effort and unlabored. Cardiovascular: RRR with S1 S2. No murmurs, rubs, or gallops. Distal pulses 2+ symmetrically. No carotid or abdominal bruits. Abdomen: Soft, non-tender, non-distended with normoactive bowel  sounds. No hepatosplenomegaly or masses. No rebound/guarding. No CVA tenderness. No hernias. Rectal: No external hemorrhoids or fissures. Rectal vault without masses. Prostate gland firm and smooth. No nodularity, tenderness, mass, or induration.  Musculoskeletal: He has decreased ROM of knee.  Skin: Warm and moist without erythema, ecchymosis, wounds, or rash. Neuro: A+Ox3. CN II-XII grossly intact. Moves all extremities spontaneously. Full sensation throughout. Gait is abnormal secondary to knee. Using crutches. Therefore, DTRs not checked Psych:  Responds to questions appropriately with a normal affect.   Assessment/Plan:  66 y.o. y/o white male here for CPE  -1. Encounter for preventive health examination  A. Screening Labs: - CBC with Differential/Platelet - COMPLETE METABOLIC PANEL WITH GFR - Lipid panel - TSH - VITAMIN D 25 Hydroxy (Vit-D Deficiency, Fractures)  B. Screening For Prostate Cancer: -  PSA  C. Screening For Colorectal Cancer:  He reports that he had colonoscopy fall 2017. States that showed polyps and was told to repeat 5 years.  D. Immunizations: Flu------------------ did receive flu vaccine for flu season 2017-2018 Tetanus-----he has not had tetanus in greater than 10 years. Agreeable to update this today. T dap given here 05/11/16. Pneumococcal-----no indication to require a pneumonia vaccine until age 79. Will start this next year. Zostavax---------- he has not had Zostavax. Told him to do it checked cost with insurance and then let us know.  2. Essential hypertension Blood Pressure is at goal/well controlled. Continue current medication.  3. Obstructive sleep apnea He is compliant with using CPAP.  4. Complete heart block (HCC) S/P Pacemaker implant. Managed by Cardiology.  5. Presence of permanent cardiac pacemaker S/P Pacemaker implant. Managed by Cardiology.  6. Left ventricular dysfunction Managed by cardiology. He is on Coreg and ACE inhibitor.   Plan routine follow-up office visit 6 months or sooner if needed. Regarding preop evaluation, he states that he already has an appointment scheduled to see Cardiology in March. They can do the Cardiac portion of cardiac clearance. I will review his lab results when I get these and make sure these are stable. If labs are good, then he is medically stable for surgery.  Signed:   [address] [place], [place]  05/11/2016 9:29 AM

## 2016-05-11 NOTE — Addendum Note (Signed)
Addended by: Phineas Semen A on: 05/11/2016 10:57 AM   Modules accepted: Orders

## 2016-05-12 ENCOUNTER — Other Ambulatory Visit: Payer: Self-pay | Admitting: *Deleted

## 2016-05-12 DIAGNOSIS — E559 Vitamin D deficiency, unspecified: Secondary | ICD-10-CM

## 2016-05-12 LAB — VITAMIN D 25 HYDROXY (VIT D DEFICIENCY, FRACTURES): VIT D 25 HYDROXY: 22 ng/mL — AB (ref 30–100)

## 2016-05-12 MED ORDER — VITAMIN D3 50 MCG (2000 UT) PO CAPS: 2000.0000 [IU] | ORAL_CAPSULE | Freq: Every day | ORAL | 3 refills | Status: DC

## 2016-05-12 MED ORDER — VITAMIN D (ERGOCALCIFEROL) 1.25 MG (50000 UNIT) PO CAPS: 50000.0000 [IU] | ORAL_CAPSULE | ORAL | 0 refills | Status: DC

## 2016-05-16 ENCOUNTER — Other Ambulatory Visit: Payer: Self-pay | Admitting: Cardiology

## 2016-05-30 ENCOUNTER — Encounter: Payer: Self-pay | Admitting: Cardiology

## 2016-05-30 ENCOUNTER — Ambulatory Visit (INDEPENDENT_AMBULATORY_CARE_PROVIDER_SITE_OTHER): Payer: 59 | Admitting: Cardiology

## 2016-05-30 ENCOUNTER — Encounter: Payer: Self-pay | Admitting: *Deleted

## 2016-05-30 VITALS — BP 146/92 | HR 60 | Ht 70.0 in | Wt 275.0 lb

## 2016-05-30 DIAGNOSIS — I442 Atrioventricular block, complete: Secondary | ICD-10-CM | POA: Diagnosis not present

## 2016-05-30 DIAGNOSIS — I519 Heart disease, unspecified: Secondary | ICD-10-CM | POA: Diagnosis not present

## 2016-05-30 LAB — CUP PACEART INCLINIC DEVICE CHECK
Battery Voltage: 2.98 V
Brady Statistic RV Percent Paced: 99.59 %
Date Time Interrogation Session: 20180306134925
Implantable Lead Implant Date: 20170615
Implantable Lead Implant Date: 20170615
Implantable Lead Location: 753859
Implantable Lead Location: 753860
Implantable Pulse Generator Implant Date: 20170615
Lead Channel Impedance Value: 475 Ohm
Lead Channel Impedance Value: 887.5 Ohm
Lead Channel Pacing Threshold Amplitude: 0.75 V
Lead Channel Pacing Threshold Amplitude: 1.625 V
Lead Channel Pacing Threshold Pulse Width: 0.4 ms
Lead Channel Pacing Threshold Pulse Width: 0.4 ms
Lead Channel Sensing Intrinsic Amplitude: 12 mV
Lead Channel Sensing Intrinsic Amplitude: 5 mV
Lead Channel Setting Pacing Amplitude: 2 V
Lead Channel Setting Pacing Amplitude: 2 V
Lead Channel Setting Pacing Amplitude: 2.625
Lead Channel Setting Pacing Pulse Width: 0.4 ms
Lead Channel Setting Pacing Pulse Width: 0.4 ms
MDC IDC LEAD IMPLANT DT: 20170615
MDC IDC LEAD LOCATION: 753858
MDC IDC MSMT LEADCHNL RA IMPEDANCE VALUE: 475 Ohm
MDC IDC MSMT LEADCHNL RA PACING THRESHOLD AMPLITUDE: 0.75 V
MDC IDC MSMT LEADCHNL RA PACING THRESHOLD PULSEWIDTH: 0.4 ms
MDC IDC PG SERIAL: 7894586
MDC IDC SET LEADCHNL RV SENSING SENSITIVITY: 5 mV
MDC IDC STAT BRADY RA PERCENT PACED: 65 %

## 2016-05-30 MED ORDER — CARVEDILOL 12.5 MG PO TABS: 12.5000 mg | ORAL_TABLET | Freq: Two times a day (BID) | ORAL | 3 refills | Status: DC

## 2016-05-30 NOTE — Progress Notes (Signed)
Electrophysiology Office Note   Date:  05/30/2016   ID:  Harold Robertson, DOB [birth year], MRN [id number]  PCP:  Frazier Richards, PA-C  Cardiologist:  Duke Salvia Primary Electrophysiologist:  Rosabell Geyer Jorja Loa, MD    Chief Complaint  Patient presents with  . Pacemaker Check    Complete heart block     History of Present Illness: Harold Robertson is a 66 y.o. male who presents today for electrophysiology evaluation.   PMHx of HTN, obesity, OSA compliant with CPAP. Was admitted to the hospital with complete AV block. Had St. Jude CRT-P implanted 09/09/15. EF at the time was 40-45%. LHC showed no evidence for obstructive CAD. Currently feeling well without any major issues. He is planning a right knee replacement for significant arthritis. No date has yet been scheduled.   Today, he denies symptoms of palpitations, chest pain, shortness of breath, orthopnea, PND, lower extremity edema, claudication, dizziness, presyncope, syncope, bleeding, or neurologic sequela. The patient is tolerating medications without difficulties and is otherwise without complaint today.    Past Medical History:  Diagnosis Date  . Hypertension   . OSA on CPAP    Past Surgical History:  Procedure Laterality Date  . APPENDECTOMY  1960s  . CARDIAC CATHETERIZATION Right 09/08/2015   Procedure: Temporary Pacemaker;  Surgeon: Dontaye Hur Jorja Loa, MD;  Location: [facility] INVASIVE CV LAB;  Service: Cardiovascular;  Laterality: Right;  . CARDIAC CATHETERIZATION N/A 09/09/2015   Procedure: Left Heart Cath and Coronary Angiography;  Surgeon: Peter M Swaziland, MD;  Location: [facility] INVASIVE CV LAB;  Service: Cardiovascular;  Laterality: N/A;  . EP IMPLANTABLE DEVICE N/A 09/09/2015   Procedure: BiV Pacemaker Insertion CRT-P;  Surgeon: Teren Franckowiak Jorja Loa, MD;  Location: [facility] INVASIVE CV LAB;  Service: Cardiovascular;  Laterality: N/A;     Current Outpatient Prescriptions  Medication Sig Dispense Refill  . aspirin 81 MG tablet Take  81 mg by mouth daily.    Marland Kitchen atorvastatin (LIPITOR) 40 MG tablet TAKE 1 TABLET BY MOUTH EVERY DAY AT 6PM 90 tablet 0  . carvedilol (COREG) 6.25 MG tablet TAKE 1 TABLET BY MOUTH TWICE A DAY WITH A MEAL 180 tablet 0  . Ibuprofen-Famotidine (DUEXIS) 800-26.6 MG TABS Take 1 tablet by mouth daily.    Marland Kitchen lisinopril (PRINIVIL,ZESTRIL) 40 MG tablet Take 1 tablet (40 mg total) by mouth daily. 90 tablet 3  . Vitamin D, Ergocalciferol, (DRISDOL) 50000 units CAPS capsule Take 1 capsule (50,000 Units total) by mouth every 7 (seven) days. Vit D 12 capsule 0   No current facility-administered medications for this visit.     Allergies:   Patient has no known allergies.   Social History:  The patient  reports that he has quit smoking. His smoking use included Cigars. He quit after 6.00 years of use. He quit smokeless tobacco use about 16 months ago. He reports that he drinks about 1.2 oz of alcohol per week . He reports that he uses drugs.   Family History:  The patient's family history includes Cancer (age of onset: 32) in his mother; Heart disease in his maternal grandmother; Heart disease (age of onset: 39) in his brother; Heart disease (age of onset: 71) in his father; Liver cancer in his mother.    ROS:  Please see the history of present illness.   Otherwise, review of systems is positive for none.   All other systems are reviewed and negative.    PHYSICAL EXAM: VS:  BP (!) 146/92   Pulse 60  Ht 5\' 10"  (1.778 m)   Wt 275 lb (124.7 kg)   BMI 39.46 kg/m  , BMI Body mass index is 39.46 kg/m. GEN: Well nourished, well developed, in no acute distress  HEENT: normal  Neck: no JVD, carotid bruits, or masses Cardiac: RRR; no murmurs, rubs, or gallops,no edema  Respiratory:  clear to auscultation bilaterally, normal work of breathing GI: soft, nontender, nondistended, + BS MS: no deformity or atrophy  Skin: warm and dry,  device pocket is well healed Neuro:  Strength and sensation are intact Psych:  euthymic mood, full affect  EKG:  EKG is ordered today. Personal review of the ekg ordered shows sinus rhythm, V pacing   Device interrogation is reviewed today in detail.  See PaceArt for details.   Recent Labs: 09/08/2015: B Natriuretic Peptide 414.9; Magnesium 2.0 05/11/2016: ALT 12; BUN 15; Creat 0.97; Hemoglobin 13.1; Platelets 184; Potassium 4.3; Sodium 140; TSH 4.45    Lipid Panel     Component Value Date/Time   CHOL 134 05/11/2016 0959   TRIG 80 05/11/2016 0959   HDL 46 05/11/2016 0959   CHOLHDL 2.9 05/11/2016 0959   VLDL 16 05/11/2016 0959   LDLCALC 72 05/11/2016 0959     Wt Readings from Last 3 Encounters:  05/30/16 275 lb (124.7 kg)  05/11/16 273 lb 9.6 oz (124.1 kg)  05/10/16 277 lb 12.8 oz (126 kg)      Other studies Reviewed: Additional studies/ records that were reviewed today include: Cardiac cath 09/09/15, TTE 09/08/15  Review of the above records today demonstrates:   Prox RCA lesion, 20% stenosed.  Prox LAD to Mid LAD lesion, 40% stenosed.  Mid Cx lesion, 20% stenosed.  Ost 2nd Mrg to 2nd Mrg lesion, 20% stenosed.  1st Diag lesion, 30% stenosed.  There is mild to moderate left ventricular systolic dysfunction.   1. Nonobstructive CAD 2. Mild to moderate LV dysfunction. EF 40-45%.  - Left ventricle: The cavity size was normal. There was mild   concentric hypertrophy. Systolic function was mildly to   moderately reduced. The estimated ejection fraction was in the   range of 40% to 45%. Diffuse hypokinesis. - Mitral valve: There was mild to moderate regurgitation. - Left atrium: The atrium was severely dilated. - Right ventricle: The cavity size was mildly dilated. Wall   thickness was normal. Systolic function was moderately reduced. - Right atrium: The atrium was severely dilated.  ASSESSMENT AND PLAN:  1.  Complete AV block: Status post CRT-P. Functioning appropriately. No changes necessary today.  2. nonobstructive CAD: No current chest  pain.  3. Hypertension: Elevated today in the office. We'll increase Coreg to 12.5 mg.  4. Hyperlipidemia: Currently well controlled on atorvastatin 40. Continue current management.  5. Preoperative evaluation: Currently he is having no chest pain or shortness of breath. He had a heart catheterization that showed nonobstructive coronary disease. Would continue his statin and beta blocker through the procedural time. He would be at intermediate risk for an intermediate risk procedure.    Current medicines are reviewed at length with the patient today.   The patient does not have concerns regarding his medicines.  The following changes were made today:  Increase carvedilol to 12.5 mg  Labs/ tests ordered today include:  Orders Placed This Encounter  Procedures  . EKG 12-Lead     Disposition:   FU with Advay Volante 1 year  Signed, Colon Rueth Jorja Loa, MD  05/30/2016 12:26 PM     [facility]  [address] [place] [address] [phone] (office) [phone] (fax)

## 2016-05-30 NOTE — Patient Instructions (Signed)
Medication Instructions:    Your physician has recommended you make the following change in your medication:  1) INCREASE Carvedilol (Coreg) to 12.5 mg tablets twice a day  --- If you need a refill on your cardiac medications before your next appointment, please call your pharmacy. ---  Labwork:  None ordered  Testing/Procedures:  None ordered  Follow-Up: Remote monitoring is used to monitor your Pacemaker of ICD from home. This monitoring reduces the number of office visits required to check your device to one time per year. It allows [place] to keep an eye on the functioning of your device to ensure it is working properly. You are scheduled for a device check from home on 08/29/2016. You may send your transmission at any time that day. If you have a wireless device, the transmission will be sent automatically. After your physician reviews your transmission, you will receive a postcard with your next transmission date.   Your physician wants you to follow-up in: 1 year with Dr. Elberta Fortis.  You will receive a reminder letter in the mail two months in advance. If you don't receive a letter, please call our office to schedule the follow-up appointment.  Thank you for choosing [facility]!!   Dory Horn, RN [phone]

## 2016-06-12 DIAGNOSIS — M1711 Unilateral primary osteoarthritis, right knee: Secondary | ICD-10-CM

## 2016-06-12 NOTE — H&P (Signed)
PREOPERATIVE H&P Patient ID: Harold Robertson MRN: [id number] DOB/AGE: February 25, 1951 66 y.o.  Chief Complaint: OA RIGHT KNEE  Planned Procedure Date: 07/11/16 Medical Clearance by Allayne Butcher, PA-C  Cardiac Clearance by Dr. Elberta Fortis  HPI: Harold Robertson is a 66 y.o. male with a history of OSA on CPAP, HLD, Non-obstructive CAD: pacemaker placement for AV block, and HTN, who presents for evaluation of OA RIGHT KNEE with pseudogout. The patient has a history of pain and functional disability in the right knee due to arthritis and has failed non-surgical conservative treatments for greater than 12 weeks to include NSAID's and/or analgesics, corticosteriod injections, viscosupplementation injections, use of assistive devices and activity modification.  Onset of symptoms was gradual, starting 1 years ago with gradually worsening course since that time.  Patient currently rates pain at 9 out of 10 with activity. Patient has night pain, worsening of pain with activity and weight bearing, pain that interferes with activities of daily living and pain with passive range of motion.  Patient has evidence of subchondral cysts, subchondral sclerosis, periarticular osteophytes and joint space narrowing by imaging studies. There is no active infection.  Past Medical History:  Diagnosis Date  . Hypertension   . OSA on CPAP    Past Surgical History:  Procedure Laterality Date  . APPENDECTOMY  1960s  . CARDIAC CATHETERIZATION Right 09/08/2015   Procedure: Temporary Pacemaker;  Surgeon: Will Jorja Loa, MD;  Location: [facility] INVASIVE CV LAB;  Service: Cardiovascular;  Laterality: Right;  . CARDIAC CATHETERIZATION N/A 09/09/2015   Procedure: Left Heart Cath and Coronary Angiography;  Surgeon: Peter M Swaziland, MD;  Location: [facility] INVASIVE CV LAB;  Service: Cardiovascular;  Laterality: N/A;  . EP IMPLANTABLE DEVICE N/A 09/09/2015   Procedure: BiV Pacemaker Insertion CRT-P;  Surgeon: Will Jorja Loa, MD;  Location: [facility]  INVASIVE CV LAB;  Service: Cardiovascular;  Laterality: N/A;   No Known Allergies Prior to Admission medications   Medication Sig Start Date End Date Taking? Authorizing Provider  aspirin 81 MG tablet Take 81 mg by mouth daily.    Historical Provider, MD  atorvastatin (LIPITOR) 40 MG tablet TAKE 1 TABLET BY MOUTH EVERY DAY AT 6PM 05/04/16   Will Jorja Loa, MD  carvedilol (COREG) 12.5 MG tablet Take 1 tablet (12.5 mg total) by mouth 2 (two) times daily. 05/30/16   Will Jorja Loa, MD  Ibuprofen-Famotidine (DUEXIS) 800-26.6 MG TABS Take 1 tablet by mouth daily.    Historical Provider, MD  lisinopril (PRINIVIL,ZESTRIL) 40 MG tablet Take 1 tablet (40 mg total) by mouth daily. 12/02/15   Will Jorja Loa, MD  Vitamin D, Ergocalciferol, (DRISDOL) 50000 units CAPS capsule Take 1 capsule (50,000 Units total) by mouth every 7 (seven) days. Vit D 05/12/16   Dorena Bodo, PA-C   Social History   Social History  . Marital status: Married    Spouse name: N/A  . Number of children: N/A  . Years of education: N/A   Occupational History  . truck driver    Social History Main Topics  . Smoking status: Former Smoker    Years: 6.00    Types: Cigars  . Smokeless tobacco: Former Neurosurgeon    Quit date: 01/14/2015  . Alcohol use 1.2 oz/week    2 Cans of beer per week  . Drug use: Yes  . Sexual activity: Yes   Other Topics Concern  . Not on file   Social History Narrative   Marital status: Married x 30 years; first marriage  Children: 2 children; no grandchildren     Lives: lives with wife      Employment: truck driver x 14 years; [place]        Tobacco:  None; pipes and cigars; chews tobacco.       Alcohol:  Weekends.      Drugs:  None      Exercise:  Rarely.      Seatbelt:  100%      Guns:  Loaded and secured.      Family History  Problem Relation Age of Onset  . Heart disease Father 22    AMI  . Liver cancer Mother   . Cancer Mother 30    Colon cancer with liver mets  .  Heart disease Brother 50    AMI  . Heart disease Maternal Grandmother     ROS: Currently denies lightheadedness, dizziness, Fever, chills, CP, SOB.   No personal history of DVT, PE, MI, or CVA. No loose teeth or dentures. All other systems have been reviewed and were otherwise currently negative with the exception of those mentioned in the HPI and as above.  Objective: Vitals: Ht: 5'11" Wt: 278 Temp: 97.4 BP: 146/97 Pulse: 60 O2 99% on room air. Physical Exam: General: Alert, NAD.  Antalgic Gait with crutches HEENT: EOMI, Good Neck Extension  Pulm: No increased work of breathing.  Clear B/L A/P w/o crackle or wheeze.  CV: RRR, No m/g/r appreciated  GI: soft, NT, ND Neuro: Neuro without gross focal deficit.  Sensation intact distally Skin: No lesions in the area of chief complaint MSK/Surgical Site: Right knee w/o redness or effusion.  + JLT. ROM 2.5-100.  5/5 strength in extension and flexion.  +EHL/FHL.  NVI.  Stable Lachman's and varus and valgus stress.    Imaging Review Plain radiographs demonstrate severe end stage tricompartmental degenerative joint disease of the right knee.   Assessment: OA RIGHT KNEE Principal Problem:   Primary osteoarthritis of right knee Active Problems:   Obstructive sleep apnea   Hypertension   Complete heart block (HCC)   Presence of permanent cardiac pacemaker   Plan: Plan for Procedure(s): TOTAL KNEE ARTHROPLASTY  The patient history, physical exam, clinical judgement of the provider and imaging are consistent with end stage degenerative joint disease and total joint arthroplasty is deemed medically necessary. The treatment options including medical management, injection therapy, and arthroplasty were discussed at length. The risks and benefits of Procedure(s): TOTAL KNEE ARTHROPLASTY were presented and reviewed.  The risks of nonoperative treatment, versus surgical intervention including but not limited to continued pain, aseptic  loosening, stiffness, dislocation/subluxation, infection, bleeding, nerve injury, blood clots, cardiopulmonary complications, morbidity, mortality, among others were discussed. The patient verbalizes understanding and wishes to proceed with the plan.  Patient is being admitted for inpatient treatment for surgery, pain control, PT, OT, prophylactic antibiotics, VTE prophylaxis, progressive ambulation, ADL's and discharge planning.   Dental prophylaxis discussed and recommended for 2 years postoperatively.  The patient does meet the criteria for TXA which will be used perioperatively via IV.   Xarelto will be used postoperatively for DVT prophylaxis in addition to SCDs, and early ambulation. The patient is planning to be discharged home with home health services (TBD - Please see Renee Angiulli, Case Manager) in care of his wife. Intermediate risk for surgery.  Continue Atorvastatin, Coreg.  Lucretia Kern Martensen III,PA-C 06/12/2016 1:36 PM

## 2016-06-29 NOTE — Pre-Procedure Instructions (Signed)
Harold Robertson  06/29/2016      [id number] [address] [address], [place] [phone] [facility] [address] [facility] [address] [place] [address] Phone: [phone] Fax: [phone]  [facility] SERVICE - [place], [place] - [address] [facility] [address] [place]  [address] Phone: [phone] Fax: [phone]    Your procedure is scheduled on April 17.  Report to [facility] Admitting at 530 A.M.  Call this number if you have problems the morning of surgery:  [phone]   Remember:  Do not eat food or drink liquids after midnight.  Take these medicines the morning of surgery with A SIP OF WATER Carvedilol (Coreg)  Stop/take aspirin as directed by your Dr.   Stop taking BC's, Goody's, Herbal medications, Ibuprofen, Advil, Motrin, Aleve, Vitamins   Do not wear jewelry, make-up or nail polish.  Do not wear lotions, powders, or perfumes, or deoderant.  Do not shave 48 hours prior to surgery.  Men may shave face and neck.  Do not bring valuables to the hospital.  [facility] is not responsible for any belongings or valuables.  Contacts, dentures or bridgework may not be worn into surgery.  Leave your suitcase in the car.  After surgery it may be brought to your room.  For patients admitted to the hospital, discharge time will be determined by your treatment team.  Patients discharged the day of surgery will not be allowed to drive home.    Special instructions:  [place] - Preparing for Surgery  Before surgery, you can play an important role.  Because skin is not sterile, your skin needs to be as free of germs as possible.  You can reduce the number of germs on you skin by washing with CHG (chlorahexidine gluconate) soap before surgery.  CHG is an antiseptic cleaner which kills germs and bonds with the skin to continue killing germs even after washing.  Please DO NOT use if you have an allergy to CHG or antibacterial  soaps.  If your skin becomes reddened/irritated stop using the CHG and inform your nurse when you arrive at Short Stay.  Do not shave (including legs and underarms) for at least 48 hours prior to the first CHG shower.  You may shave your face.  Please follow these instructions carefully:   1.  Shower with CHG Soap the night before surgery and the    morning of Surgery.  2.  If you choose to wash your hair, wash your hair first as usual with your  normal shampoo.  3.  After you shampoo, rinse your hair and body thoroughly to remove the  Shampoo.  4.  Use CHG as you would any other liquid soap.  You can apply chg directly  to the skin and wash gently with scrungie or a clean washcloth.  5.  Apply the CHG Soap to your body ONLY FROM THE NECK DOWN.   Do not use on open wounds or open sores.  Avoid contact with your eyes,       ears, mouth and genitals (private parts).  Wash genitals (private parts)   with your normal soap.  6.  Wash thoroughly, paying special attention to the area where your surgery   will be performed.  7.  Thoroughly rinse your body with warm water from the neck down.  8.  DO NOT shower/wash with your normal soap after using and rinsing off  the  CHG Soap.  9.  Pat yourself dry with a clean towel.            10.  Wear clean pajamas.            11.  Place clean sheets on your bed the night of your first shower and do not sleep with pets.  Day of Surgery  Do not apply any lotions/deoderants the morning of surgery.  Please wear clean clothes to the hospital/surgery center.     Please read over the following fact sheets that you were given. Pain Booklet, Coughing and Deep Breathing, MRSA Information and Surgical Site Infection Prevention

## 2016-06-30 ENCOUNTER — Encounter (HOSPITAL_COMMUNITY)
Admission: RE | Admit: 2016-06-30 | Discharge: 2016-06-30 | Disposition: A | Discharge status: Home / Self Care | Payer: Medicare Other | Source: Ambulatory Visit | Attending: Orthopedic Surgery | Admitting: Orthopedic Surgery

## 2016-06-30 ENCOUNTER — Encounter (HOSPITAL_COMMUNITY): Payer: Self-pay

## 2016-06-30 DIAGNOSIS — I442 Atrioventricular block, complete: Secondary | ICD-10-CM | POA: Insufficient documentation

## 2016-06-30 DIAGNOSIS — I2510 Atherosclerotic heart disease of native coronary artery without angina pectoris: Secondary | ICD-10-CM | POA: Diagnosis not present

## 2016-06-30 DIAGNOSIS — Z7982 Long term (current) use of aspirin: Secondary | ICD-10-CM | POA: Diagnosis not present

## 2016-06-30 DIAGNOSIS — F17200 Nicotine dependence, unspecified, uncomplicated: Secondary | ICD-10-CM | POA: Diagnosis not present

## 2016-06-30 DIAGNOSIS — Z950 Presence of cardiac pacemaker: Secondary | ICD-10-CM | POA: Insufficient documentation

## 2016-06-30 DIAGNOSIS — G4733 Obstructive sleep apnea (adult) (pediatric): Secondary | ICD-10-CM | POA: Diagnosis not present

## 2016-06-30 DIAGNOSIS — Z79899 Other long term (current) drug therapy: Secondary | ICD-10-CM | POA: Diagnosis not present

## 2016-06-30 DIAGNOSIS — I10 Essential (primary) hypertension: Secondary | ICD-10-CM | POA: Diagnosis not present

## 2016-06-30 DIAGNOSIS — Z01812 Encounter for preprocedural laboratory examination: Secondary | ICD-10-CM | POA: Diagnosis not present

## 2016-06-30 HISTORY — DX: Presence of cardiac pacemaker: Z95.0

## 2016-06-30 HISTORY — DX: Atrioventricular block, complete: I44.2

## 2016-06-30 HISTORY — DX: Unspecified osteoarthritis, unspecified site: M19.90

## 2016-06-30 LAB — CBC
HCT: 41 % (ref 39.0–52.0)
Hemoglobin: 14 g/dL (ref 13.0–17.0)
MCH: 28.1 pg (ref 26.0–34.0)
MCHC: 34.1 g/dL (ref 30.0–36.0)
MCV: 82.2 fL (ref 78.0–100.0)
Platelets: 160 10*3/uL (ref 150–400)
RBC: 4.99 MIL/uL (ref 4.22–5.81)
RDW: 14.6 % (ref 11.5–15.5)
WBC: 4.8 10*3/uL (ref 4.0–10.5)

## 2016-06-30 LAB — BASIC METABOLIC PANEL
Anion gap: 8 (ref 5–15)
BUN: 14 mg/dL (ref 6–20)
CO2: 21 mmol/L — ABNORMAL LOW (ref 22–32)
Calcium: 9.2 mg/dL (ref 8.9–10.3)
Chloride: 109 mmol/L (ref 101–111)
Creatinine, Ser: 0.97 mg/dL (ref 0.61–1.24)
GFR calc Af Amer: >60 mL/min (ref >60)
GLUCOSE: 97 mg/dL (ref 65–99)
POTASSIUM: 4.3 mmol/L (ref 3.5–5.1)
Sodium: 138 mmol/L (ref 135–145)

## 2016-06-30 LAB — SURGICAL PCR SCREEN
MRSA, PCR: NEGATIVE
STAPHYLOCOCCUS AUREUS: NEGATIVE

## 2016-06-30 NOTE — Progress Notes (Signed)
PCP is Corrie Dandy B. Dixon-PA-C Cardiologist is Dr Elberta Fortis - also manages his pacemaker Reports he will stop asa 7 days prior to surgery. Instructed him to bring his CPAP mask- States he will bring his machine too. Denies any chest pain.

## 2016-07-04 ENCOUNTER — Encounter (HOSPITAL_COMMUNITY): Payer: Self-pay

## 2016-07-04 NOTE — Progress Notes (Addendum)
Anesthesia Chart Review:  Pt is a 66 year old male scheduled for R total knee arthroplasty on 07/11/2016 with Margarita Rana, MD.   -PCP is Frazier Richards, [place], who cleared pt for surgery - EP cardiologist is Will Elberta Fortis, MD, who cleared pt for surgery at intermediate risk at last office visit 05/30/16 - Cardiologist is Chilton Si, MD  PMH includes:  HTN, complete heart block, pacemaker (St. Jude CRT-P implanted 09/09/15), OSA. Current smoker. BMI 40  - Hospitalized 01/28/16 for sepsis, UTI. Complicated by acute renal failure  Medications include: ASA 81 mg, Lipitor, carvedilol, lisinopril.  Preoperative labs reviewed.    CXR 01/26/16: No active cardiopulmonary disease.  EKG 05/30/16: Electronic atrial pacemaker. RBBB. Lateral infarct, age undetermined.  Cardiac cath 09/09/15:  1. Nonobstructive CAD (RCA 20%, LAD 40%, CX 20%, OM2 20%, D1 30%) 2. Mild to moderate LV dysfunction. EF 40-45%.  Echo 09/08/15:  - Left ventricle: The cavity size was normal. There was mild concentric hypertrophy. Systolic function was mildly to moderately reduced. The estimated ejection fraction was in the range of 40% to 45%. Diffuse hypokinesis. - Mitral valve: There was mild to moderate regurgitation. - Left atrium: The atrium was severely dilated. - Right ventricle: The cavity size was mildly dilated. Wall thickness was normal. Systolic function was moderately reduced. - Right atrium: The atrium was severely dilated.  Perioperative prescription for pacemaker states pt is pacemaker dependent. Procedure should not interfere with device function; no device reprogramming magnet placement needed.  If no changes, I anticipate pt can proceed with surgery as scheduled.   Rica Mast, FNP-BC [facility] Short Stay Surgical Center/Anesthesiology Phone: [phone] 07/04/2016 2:22 PM

## 2016-07-07 ENCOUNTER — Other Ambulatory Visit: Payer: Self-pay | Admitting: Orthopedic Surgery

## 2016-07-10 MED ORDER — DEXTROSE 5 % IV SOLN
3.0000 g | INTRAVENOUS | Status: AC
  Administered 2016-07-11: 3 g via INTRAVENOUS
  Filled 2016-07-10: qty 3000

## 2016-07-10 MED ORDER — TRANEXAMIC ACID 1000 MG/10ML IV SOLN
1000.0000 mg | INTRAVENOUS | Status: AC
  Administered 2016-07-11: 1000 mg via INTRAVENOUS
  Filled 2016-07-10: qty 10

## 2016-07-11 ENCOUNTER — Inpatient Hospital Stay (HOSPITAL_COMMUNITY): Payer: Medicare Other | Admitting: Certified Registered"

## 2016-07-11 ENCOUNTER — Inpatient Hospital Stay (HOSPITAL_COMMUNITY)
Admission: RE | Admit: 2016-07-11 | Discharge: 2016-07-13 | DRG: 470 | Disposition: A | Discharge status: Home Health | Payer: Medicare Other | Source: Ambulatory Visit | Attending: Orthopedic Surgery | Admitting: Orthopedic Surgery

## 2016-07-11 ENCOUNTER — Encounter (HOSPITAL_COMMUNITY): Payer: Self-pay | Admitting: *Deleted

## 2016-07-11 ENCOUNTER — Encounter (HOSPITAL_COMMUNITY)
Admission: RE | Disposition: A | Discharge status: Home Health | Payer: Self-pay | Source: Ambulatory Visit | Attending: Orthopedic Surgery

## 2016-07-11 ENCOUNTER — Inpatient Hospital Stay (HOSPITAL_COMMUNITY): Payer: Medicare Other | Admitting: Emergency Medicine

## 2016-07-11 ENCOUNTER — Inpatient Hospital Stay (HOSPITAL_COMMUNITY): Payer: Medicare Other

## 2016-07-11 DIAGNOSIS — E669 Obesity, unspecified: Secondary | ICD-10-CM | POA: Diagnosis present

## 2016-07-11 DIAGNOSIS — E785 Hyperlipidemia, unspecified: Secondary | ICD-10-CM | POA: Diagnosis present

## 2016-07-11 DIAGNOSIS — G4733 Obstructive sleep apnea (adult) (pediatric): Secondary | ICD-10-CM | POA: Diagnosis present

## 2016-07-11 DIAGNOSIS — Z96651 Presence of right artificial knee joint: Secondary | ICD-10-CM | POA: Diagnosis not present

## 2016-07-11 DIAGNOSIS — Z7982 Long term (current) use of aspirin: Secondary | ICD-10-CM

## 2016-07-11 DIAGNOSIS — I4430 Unspecified atrioventricular block: Secondary | ICD-10-CM | POA: Diagnosis present

## 2016-07-11 DIAGNOSIS — Z87891 Personal history of nicotine dependence: Secondary | ICD-10-CM | POA: Diagnosis not present

## 2016-07-11 DIAGNOSIS — I2510 Atherosclerotic heart disease of native coronary artery without angina pectoris: Secondary | ICD-10-CM | POA: Diagnosis not present

## 2016-07-11 DIAGNOSIS — M1711 Unilateral primary osteoarthritis, right knee: Principal | ICD-10-CM

## 2016-07-11 DIAGNOSIS — G8918 Other acute postprocedural pain: Secondary | ICD-10-CM | POA: Diagnosis not present

## 2016-07-11 DIAGNOSIS — Z471 Aftercare following joint replacement surgery: Secondary | ICD-10-CM | POA: Diagnosis not present

## 2016-07-11 DIAGNOSIS — M11261 Other chondrocalcinosis, right knee: Secondary | ICD-10-CM | POA: Diagnosis not present

## 2016-07-11 DIAGNOSIS — I442 Atrioventricular block, complete: Secondary | ICD-10-CM | POA: Diagnosis not present

## 2016-07-11 DIAGNOSIS — Z79899 Other long term (current) drug therapy: Secondary | ICD-10-CM

## 2016-07-11 DIAGNOSIS — I10 Essential (primary) hypertension: Secondary | ICD-10-CM | POA: Diagnosis present

## 2016-07-11 DIAGNOSIS — Z950 Presence of cardiac pacemaker: Secondary | ICD-10-CM | POA: Diagnosis present

## 2016-07-11 HISTORY — PX: TOTAL KNEE ARTHROPLASTY: SHX125

## 2016-07-11 SURGERY — ARTHROPLASTY, KNEE, TOTAL
Anesthesia: Spinal | Laterality: Right

## 2016-07-11 MED ORDER — METHOCARBAMOL 500 MG PO TABS
500.0000 mg | ORAL_TABLET | Freq: Four times a day (QID) | ORAL | Status: DC | PRN
  Administered 2016-07-11 – 2016-07-13 (×4): 500 mg via ORAL
  Filled 2016-07-11 (×4): qty 1

## 2016-07-11 MED ORDER — ACETAMINOPHEN 650 MG RE SUPP: 650.0000 mg | Freq: Four times a day (QID) | RECTAL | Status: DC | PRN

## 2016-07-11 MED ORDER — GABAPENTIN 300 MG PO CAPS
300.0000 mg | ORAL_CAPSULE | Freq: Once | ORAL | Status: AC
  Administered 2016-07-11: 300 mg via ORAL
  Filled 2016-07-11: qty 1

## 2016-07-11 MED ORDER — 0.9 % SODIUM CHLORIDE (POUR BTL) OPTIME
TOPICAL | Status: DC | PRN
  Administered 2016-07-11: 1000 mL

## 2016-07-11 MED ORDER — DOCUSATE SODIUM 100 MG PO CAPS: 100.0000 mg | ORAL_CAPSULE | Freq: Two times a day (BID) | ORAL | 0 refills | Status: DC

## 2016-07-11 MED ORDER — PHENYLEPHRINE HCL 10 MG/ML IJ SOLN
INTRAMUSCULAR | Status: DC | PRN
  Administered 2016-07-11: 80 ug via INTRAVENOUS
  Administered 2016-07-11: 120 ug via INTRAVENOUS
  Administered 2016-07-11: 80 ug via INTRAVENOUS
  Administered 2016-07-11: 160 ug via INTRAVENOUS

## 2016-07-11 MED ORDER — METOCLOPRAMIDE HCL 5 MG PO TABS
5.0000 mg | ORAL_TABLET | Freq: Three times a day (TID) | ORAL | Status: DC | PRN
Start: 2016-07-11 — End: 2016-07-13

## 2016-07-11 MED ORDER — LISINOPRIL 40 MG PO TABS
40.0000 mg | ORAL_TABLET | Freq: Every day | ORAL | Status: DC
  Administered 2016-07-11 – 2016-07-13 (×3): 40 mg via ORAL
  Filled 2016-07-11 (×3): qty 1

## 2016-07-11 MED ORDER — ONDANSETRON HCL 4 MG PO TABS: 4.0000 mg | ORAL_TABLET | Freq: Four times a day (QID) | ORAL | Status: DC | PRN

## 2016-07-11 MED ORDER — BUPIVACAINE HCL (PF) 0.25 % IJ SOLN
INTRAMUSCULAR | Status: DC | PRN
  Administered 2016-07-11: 30 mL

## 2016-07-11 MED ORDER — KETOROLAC TROMETHAMINE 15 MG/ML IJ SOLN
7.5000 mg | Freq: Four times a day (QID) | INTRAMUSCULAR | Status: AC
  Administered 2016-07-11: 7.5 mg via INTRAVENOUS
  Administered 2016-07-11: 15 mg via INTRAVENOUS
  Administered 2016-07-11 – 2016-07-12 (×2): 7.5 mg via INTRAVENOUS
  Filled 2016-07-11 (×3): qty 1

## 2016-07-11 MED ORDER — SORBITOL 70 % SOLN: 30.0000 mL | Freq: Every day | Status: DC | PRN

## 2016-07-11 MED ORDER — BUPIVACAINE IN DEXTROSE 0.75-8.25 % IT SOLN
INTRATHECAL | Status: DC | PRN
  Administered 2016-07-11: 2 mL via INTRATHECAL

## 2016-07-11 MED ORDER — METOCLOPRAMIDE HCL 5 MG/ML IJ SOLN: 5.0000 mg | Freq: Three times a day (TID) | INTRAMUSCULAR | Status: DC | PRN

## 2016-07-11 MED ORDER — DEXAMETHASONE SODIUM PHOSPHATE 10 MG/ML IJ SOLN
10.0000 mg | Freq: Once | INTRAMUSCULAR | Status: AC
  Administered 2016-07-12: 10 mg via INTRAVENOUS
  Filled 2016-07-11: qty 1

## 2016-07-11 MED ORDER — SENNA 8.6 MG PO TABS
1.0000 | ORAL_TABLET | Freq: Two times a day (BID) | ORAL | Status: DC
  Administered 2016-07-11 – 2016-07-13 (×4): 8.6 mg via ORAL
  Filled 2016-07-11 (×4): qty 1

## 2016-07-11 MED ORDER — LACTATED RINGERS IV SOLN
INTRAVENOUS | Status: DC
  Administered 2016-07-11 (×2): via INTRAVENOUS

## 2016-07-11 MED ORDER — KETOROLAC TROMETHAMINE 30 MG/ML IJ SOLN
INTRAMUSCULAR | Status: AC
  Filled 2016-07-11: qty 1

## 2016-07-11 MED ORDER — PROPOFOL 10 MG/ML IV BOLUS
INTRAVENOUS | Status: AC
  Filled 2016-07-11: qty 20

## 2016-07-11 MED ORDER — ROCURONIUM BROMIDE 50 MG/5ML IV SOSY
PREFILLED_SYRINGE | INTRAVENOUS | Status: AC
  Filled 2016-07-11: qty 5

## 2016-07-11 MED ORDER — LACTATED RINGERS IV SOLN
INTRAVENOUS | Status: DC
  Administered 2016-07-11 (×2): via INTRAVENOUS

## 2016-07-11 MED ORDER — OXYCODONE HCL 5 MG PO TABS
ORAL_TABLET | ORAL | Status: AC
  Filled 2016-07-11: qty 1

## 2016-07-11 MED ORDER — MIDAZOLAM HCL 5 MG/5ML IJ SOLN
INTRAMUSCULAR | Status: DC | PRN
  Administered 2016-07-11: 2 mg via INTRAVENOUS

## 2016-07-11 MED ORDER — FENTANYL CITRATE (PF) 100 MCG/2ML IJ SOLN
INTRAMUSCULAR | Status: DC | PRN
  Administered 2016-07-11: 50 ug via INTRAVENOUS
  Administered 2016-07-11: 25 ug via INTRAVENOUS
  Administered 2016-07-11 (×3): 50 ug via INTRAVENOUS
  Administered 2016-07-11: 25 ug via INTRAVENOUS

## 2016-07-11 MED ORDER — ASPIRIN EC 325 MG PO TBEC: 325.0000 mg | DELAYED_RELEASE_TABLET | Freq: Every day | ORAL | 0 refills | Status: DC

## 2016-07-11 MED ORDER — ASPIRIN EC 325 MG PO TBEC: 325.0000 mg | DELAYED_RELEASE_TABLET | Freq: Every day | ORAL | Status: DC

## 2016-07-11 MED ORDER — ACETAMINOPHEN 325 MG PO TABS
650.0000 mg | ORAL_TABLET | Freq: Four times a day (QID) | ORAL | Status: AC
  Administered 2016-07-11 – 2016-07-12 (×3): 650 mg via ORAL
  Filled 2016-07-11 (×3): qty 2

## 2016-07-11 MED ORDER — FENTANYL CITRATE (PF) 250 MCG/5ML IJ SOLN
INTRAMUSCULAR | Status: AC
Start: 2016-07-11 — End: 2016-07-11
  Filled 2016-07-11: qty 5

## 2016-07-11 MED ORDER — CARVEDILOL 12.5 MG PO TABS
12.5000 mg | ORAL_TABLET | Freq: Two times a day (BID) | ORAL | Status: DC
  Administered 2016-07-11 – 2016-07-13 (×4): 12.5 mg via ORAL
  Filled 2016-07-11 (×4): qty 1

## 2016-07-11 MED ORDER — OXYCODONE-ACETAMINOPHEN 5-325 MG PO TABS: 1.0000 | ORAL_TABLET | ORAL | 0 refills | Status: DC | PRN

## 2016-07-11 MED ORDER — OXYCODONE HCL 5 MG PO TABS
5.0000 mg | ORAL_TABLET | ORAL | Status: DC | PRN
  Administered 2016-07-11 (×2): 10 mg via ORAL
  Administered 2016-07-11: 5 mg via ORAL
  Administered 2016-07-12 – 2016-07-13 (×7): 10 mg via ORAL
  Filled 2016-07-11 (×9): qty 2

## 2016-07-11 MED ORDER — POLYETHYLENE GLYCOL 3350 17 G PO PACK: 17.0000 g | PACK | Freq: Every day | ORAL | Status: DC | PRN

## 2016-07-11 MED ORDER — PHENYLEPHRINE 40 MCG/ML (10ML) SYRINGE FOR IV PUSH (FOR BLOOD PRESSURE SUPPORT)
PREFILLED_SYRINGE | INTRAVENOUS | Status: AC
  Filled 2016-07-11: qty 10

## 2016-07-11 MED ORDER — CEFAZOLIN SODIUM-DEXTROSE 2-4 GM/100ML-% IV SOLN
2.0000 g | Freq: Four times a day (QID) | INTRAVENOUS | Status: AC
  Administered 2016-07-11 (×3): 2 g via INTRAVENOUS
  Filled 2016-07-11 (×2): qty 100

## 2016-07-11 MED ORDER — PROPOFOL 500 MG/50ML IV EMUL
INTRAVENOUS | Status: DC | PRN
  Administered 2016-07-11: 25 ug/kg/min via INTRAVENOUS

## 2016-07-11 MED ORDER — ONDANSETRON HCL 4 MG/2ML IJ SOLN: 4.0000 mg | Freq: Four times a day (QID) | INTRAMUSCULAR | Status: DC | PRN

## 2016-07-11 MED ORDER — ATORVASTATIN CALCIUM 40 MG PO TABS
40.0000 mg | ORAL_TABLET | Freq: Every day | ORAL | Status: DC
  Administered 2016-07-11 – 2016-07-12 (×2): 40 mg via ORAL
  Filled 2016-07-11 (×2): qty 1

## 2016-07-11 MED ORDER — DOCUSATE SODIUM 100 MG PO CAPS
100.0000 mg | ORAL_CAPSULE | Freq: Two times a day (BID) | ORAL | Status: DC
  Administered 2016-07-11 – 2016-07-13 (×4): 100 mg via ORAL
  Filled 2016-07-11 (×4): qty 1

## 2016-07-11 MED ORDER — ACETAMINOPHEN 500 MG PO TABS
ORAL_TABLET | ORAL | Status: AC
  Filled 2016-07-11: qty 2

## 2016-07-11 MED ORDER — MENTHOL 3 MG MT LOZG: 1.0000 | LOZENGE | OROMUCOSAL | Status: DC | PRN

## 2016-07-11 MED ORDER — MIDAZOLAM HCL 2 MG/2ML IJ SOLN
INTRAMUSCULAR | Status: AC
  Filled 2016-07-11: qty 2

## 2016-07-11 MED ORDER — METHOCARBAMOL 500 MG PO TABS
ORAL_TABLET | ORAL | Status: AC
  Filled 2016-07-11: qty 1

## 2016-07-11 MED ORDER — LIDOCAINE 2% (20 MG/ML) 5 ML SYRINGE
INTRAMUSCULAR | Status: AC
  Filled 2016-07-11: qty 5

## 2016-07-11 MED ORDER — DIPHENHYDRAMINE HCL 12.5 MG/5ML PO ELIX: 12.5000 mg | ORAL_SOLUTION | ORAL | Status: DC | PRN

## 2016-07-11 MED ORDER — ACETAMINOPHEN 500 MG PO TABS
1000.0000 mg | ORAL_TABLET | Freq: Once | ORAL | Status: AC
  Administered 2016-07-11: 1000 mg via ORAL

## 2016-07-11 MED ORDER — KETOROLAC TROMETHAMINE 30 MG/ML IJ SOLN
INTRAMUSCULAR | Status: DC | PRN
  Administered 2016-07-11: 30 mg via INTRAVENOUS

## 2016-07-11 MED ORDER — DEXTROSE 5 % IV SOLN
500.0000 mg | Freq: Four times a day (QID) | INTRAVENOUS | Status: DC | PRN
  Filled 2016-07-11: qty 5

## 2016-07-11 MED ORDER — METHOCARBAMOL 500 MG PO TABS: 500.0000 mg | ORAL_TABLET | Freq: Four times a day (QID) | ORAL | 0 refills | Status: DC | PRN

## 2016-07-11 MED ORDER — ACETAMINOPHEN 325 MG PO TABS
650.0000 mg | ORAL_TABLET | Freq: Four times a day (QID) | ORAL | Status: DC | PRN
  Administered 2016-07-12: 650 mg via ORAL
  Filled 2016-07-11 (×2): qty 2

## 2016-07-11 MED ORDER — CHLORHEXIDINE GLUCONATE 4 % EX LIQD: 60.0000 mL | Freq: Once | CUTANEOUS | Status: DC

## 2016-07-11 MED ORDER — BUPIVACAINE HCL (PF) 0.25 % IJ SOLN
INTRAMUSCULAR | Status: AC
  Filled 2016-07-11: qty 30

## 2016-07-11 MED ORDER — PHENOL 1.4 % MT LIQD
1.0000 | OROMUCOSAL | Status: DC | PRN
Start: 2016-07-11 — End: 2016-07-13

## 2016-07-11 MED ORDER — PHENYLEPHRINE HCL 10 MG/ML IJ SOLN
INTRAVENOUS | Status: DC | PRN
  Administered 2016-07-11: 30 ug/min via INTRAVENOUS

## 2016-07-11 MED ORDER — SODIUM CHLORIDE FLUSH 0.9 % IV SOLN
INTRAVENOUS | Status: DC | PRN
  Administered 2016-07-11: 30 mL via INTRAVENOUS

## 2016-07-11 MED ORDER — ONDANSETRON HCL 4 MG PO TABS: 4.0000 mg | ORAL_TABLET | Freq: Three times a day (TID) | ORAL | 0 refills | Status: DC | PRN

## 2016-07-11 MED ORDER — KETOROLAC TROMETHAMINE 15 MG/ML IJ SOLN
INTRAMUSCULAR | Status: AC
  Filled 2016-07-11: qty 1

## 2016-07-11 MED ORDER — SODIUM CHLORIDE 0.9 % IR SOLN
Status: DC | PRN
  Administered 2016-07-11: 3000 mL

## 2016-07-11 MED ORDER — MORPHINE SULFATE (PF) 2 MG/ML IV SOLN
2.0000 mg | INTRAVENOUS | Status: DC | PRN
  Administered 2016-07-11 – 2016-07-12 (×3): 2 mg via INTRAVENOUS
  Filled 2016-07-11 (×3): qty 1

## 2016-07-11 MED ORDER — BUPIVACAINE-EPINEPHRINE (PF) 0.5% -1:200000 IJ SOLN
INTRAMUSCULAR | Status: DC | PRN
  Administered 2016-07-11: 30 mL via PERINEURAL

## 2016-07-11 SURGICAL SUPPLY — 63 items
BANDAGE ESMARK 6X9 LF (GAUZE/BANDAGES/DRESSINGS) ×1 IMPLANT
BLADE SAG 18X100X1.27 (BLADE) ×3 IMPLANT
BNDG COHESIVE 4X5 TAN STRL (GAUZE/BANDAGES/DRESSINGS) ×3 IMPLANT
BNDG COHESIVE 6X5 TAN STRL LF (GAUZE/BANDAGES/DRESSINGS) ×3 IMPLANT
BNDG ELASTIC 6X10 VLCR STRL LF (GAUZE/BANDAGES/DRESSINGS) ×3 IMPLANT
BNDG ESMARK 6X9 LF (GAUZE/BANDAGES/DRESSINGS) ×3
BOWL SMART MIX CTS (DISPOSABLE) ×3 IMPLANT
CAPT KNEE TRIATH TK-4 ×3 IMPLANT
CLOSURE STERI-STRIP 1/2X4 (GAUZE/BANDAGES/DRESSINGS) ×2
CLSR STERI-STRIP ANTIMIC 1/2X4 (GAUZE/BANDAGES/DRESSINGS) ×4 IMPLANT
COVER SURGICAL LIGHT HANDLE (MISCELLANEOUS) ×3 IMPLANT
CUFF TOURNIQUET SINGLE 34IN LL (TOURNIQUET CUFF) ×3 IMPLANT
DERMABOND ADVANCED (GAUZE/BANDAGES/DRESSINGS) ×2
DERMABOND ADVANCED .7 DNX12 (GAUZE/BANDAGES/DRESSINGS) ×1 IMPLANT
DRAPE HALF SHEET 40X57 (DRAPES) ×3 IMPLANT
DRAPE U-SHAPE 47X51 STRL (DRAPES) ×3 IMPLANT
DRSG ADAPTIC 3X8 NADH LF (GAUZE/BANDAGES/DRESSINGS) ×3 IMPLANT
DRSG MEPILEX BORDER 4X8 (GAUZE/BANDAGES/DRESSINGS) ×3 IMPLANT
DURAPREP 26ML APPLICATOR (WOUND CARE) ×6 IMPLANT
ELECT CAUTERY BLADE 6.4 (BLADE) ×3 IMPLANT
ELECT REM PT RETURN 9FT ADLT (ELECTROSURGICAL) ×3
ELECTRODE REM PT RTRN 9FT ADLT (ELECTROSURGICAL) ×1 IMPLANT
FACESHIELD WRAPAROUND (MASK) ×9 IMPLANT
GAUZE SPONGE 4X4 12PLY STRL (GAUZE/BANDAGES/DRESSINGS) ×3 IMPLANT
GLOVE BIO SURGEON STRL SZ7 (GLOVE) ×6 IMPLANT
GLOVE BIO SURGEON STRL SZ7.5 (GLOVE) ×6 IMPLANT
GLOVE BIOGEL PI IND STRL 7.0 (GLOVE) ×2 IMPLANT
GLOVE BIOGEL PI IND STRL 8 (GLOVE) ×2 IMPLANT
GLOVE BIOGEL PI INDICATOR 7.0 (GLOVE) ×4
GLOVE BIOGEL PI INDICATOR 8 (GLOVE) ×4
GLOVE SURG SS PI 6.5 STRL IVOR (GLOVE) ×9 IMPLANT
GOWN STRL REUS W/ TWL LRG LVL3 (GOWN DISPOSABLE) ×3 IMPLANT
GOWN STRL REUS W/ TWL XL LVL3 (GOWN DISPOSABLE) ×2 IMPLANT
GOWN STRL REUS W/TWL LRG LVL3 (GOWN DISPOSABLE) ×6
GOWN STRL REUS W/TWL XL LVL3 (GOWN DISPOSABLE) ×4
HANDPIECE INTERPULSE COAX TIP (DISPOSABLE) ×2
IMMOBILIZER KNEE 22 UNIV (SOFTGOODS) IMPLANT
IMMOBILIZER KNEE 24 THIGH 36 (MISCELLANEOUS) IMPLANT
IMMOBILIZER KNEE 24 UNIV (MISCELLANEOUS)
KIT BASIN OR (CUSTOM PROCEDURE TRAY) ×3 IMPLANT
KIT ROOM TURNOVER OR (KITS) ×3 IMPLANT
MANIFOLD NEPTUNE II (INSTRUMENTS) ×3 IMPLANT
NEEDLE 18GX1X1/2 (RX/OR ONLY) (NEEDLE) ×3 IMPLANT
NS IRRIG 1000ML POUR BTL (IV SOLUTION) ×3 IMPLANT
PACK TOTAL JOINT (CUSTOM PROCEDURE TRAY) ×3 IMPLANT
PACK UNIVERSAL I (CUSTOM PROCEDURE TRAY) ×3 IMPLANT
PAD ARMBOARD 7.5X6 YLW CONV (MISCELLANEOUS) ×3 IMPLANT
SET HNDPC FAN SPRY TIP SCT (DISPOSABLE) ×1 IMPLANT
STAPLER VISISTAT 35W (STAPLE) IMPLANT
SUCTION FRAZIER HANDLE 10FR (MISCELLANEOUS) ×2
SUCTION TUBE FRAZIER 10FR DISP (MISCELLANEOUS) ×1 IMPLANT
SUT MNCRL AB 4-0 PS2 18 (SUTURE) ×3 IMPLANT
SUT MON AB 2-0 CT1 27 (SUTURE) ×6 IMPLANT
SUT MON AB 2-0 CT1 36 (SUTURE) ×3 IMPLANT
SUT VIC AB 0 CT1 27 (SUTURE) ×2
SUT VIC AB 0 CT1 27XBRD ANBCTR (SUTURE) ×1 IMPLANT
SUT VIC AB 1 CT1 27 (SUTURE) ×6
SUT VIC AB 1 CT1 27XBRD ANBCTR (SUTURE) ×3 IMPLANT
SYR 50ML LL SCALE MARK (SYRINGE) ×3 IMPLANT
TOWEL OR 17X24 6PK STRL BLUE (TOWEL DISPOSABLE) ×3 IMPLANT
TOWEL OR 17X26 10 PK STRL BLUE (TOWEL DISPOSABLE) ×3 IMPLANT
TRAY CATH 16FR W/PLASTIC CATH (SET/KITS/TRAYS/PACK) IMPLANT
TRAY FOLEY BAG SILVER LF 14FR (CATHETERS) IMPLANT

## 2016-07-11 NOTE — Progress Notes (Signed)
Orthopedic Tech Progress Note Patient Details:  Harold Robertson 1950/10/08 [id number]  CPM Right Knee CPM Right Knee: On Right Knee Flexion (Degrees): 90 Right Knee Extension (Degrees): 0   Saul Fordyce 07/11/2016, 11:11 AM

## 2016-07-11 NOTE — Progress Notes (Unsigned)
d 

## 2016-07-11 NOTE — Anesthesia Procedure Notes (Addendum)
Anesthesia Regional Block: Adductor canal block   Pre-Anesthetic Checklist: ,, timeout performed,, Correct Site, Correct Laterality, Correct Procedure, Correct Position, site marked, risks and benefits discussed, Surgical consent,  Pre-op evaluation,  At surgeon's request and post-op pain management  Laterality: Right and Lower  Prep: chloraprep       Needles:   Needle Type: Echogenic Stimulator Needle     Needle Length: 9cm  Needle Gauge: 21   Needle insertion depth: 7 cm   Additional Needles:   Procedures: ultrasound guided,,,,,,,,  Narrative:  Start time: 07/11/2016 7:00 AM End time: 07/11/2016 7:16 AM Injection made incrementally with aspirations every 5 mL.  Performed by: Personally  Anesthesiologist: Carrol Hougland

## 2016-07-11 NOTE — Op Note (Signed)
DATE OF SURGERY:  07/11/2016 TIME: 9:22 AM  PATIENT NAME:  Harold Robertson   AGE: 66 y.o.    PRE-OPERATIVE DIAGNOSIS:  OA RIGHT KNEE  POST-OPERATIVE DIAGNOSIS:  Same  PROCEDURE:  Procedure(s): TOTAL KNEE ARTHROPLASTY   SURGEON:  Marleny Faller D, MD   ASSISTANT:  Aquilla Hacker, PA-C, he was present and scrubbed throughout the case, critical for completion in a timely fashion, and for retraction, instrumentation, and closure.    OPERATIVE IMPLANTS: Stryker Triathlon Posterior Stabilized. Press fit knee  Femur size 5, Tibia size 6, Patella size 32 3-peg oval button, with a 9 mm polyethylene insert.   PREOPERATIVE INDICATIONS:  [name] is a 66 y.o. year old male with end stage bone on bone degenerative arthritis of the knee who failed conservative treatment, including injections, antiinflammatories, activity modification, and assistive devices, and had significant impairment of their activities of daily living, and elected for Total Knee Arthroplasty.   The risks, benefits, and alternatives were discussed at length including but not limited to the risks of infection, bleeding, nerve injury, stiffness, blood clots, the need for revision surgery, cardiopulmonary complications, among others, and they were willing to proceed.   OPERATIVE DESCRIPTION:  The patient was brought to the operative room and placed in a supine position.  General anesthesia was administered.  IV antibiotics were given.  The lower extremity was prepped and draped in the usual sterile fashion.  Time out was performed.  The leg was elevated and exsanguinated and the tourniquet was inflated.  Anterior approach was performed.  The patella was everted and osteophytes were removed.  The anterior horn of the medial and lateral meniscus was removed.   The distal femur was opened with the drill and the intramedullary distal femoral cutting jig was utilized, set at 5 degrees resecting 10 mm off the distal  femur.  Care was taken to protect the collateral ligaments.  The distal femoral sizing jig was applied, taking care to avoid notching.  Then the 4-in-1 cutting jig was applied and the anterior and posterior femur was cut, along with the chamfer cuts.  All posterior osteophytes were removed.  The flexion gap was then measured and was symmetric with the extension gap.  Then the extramedullary tibial cutting jig was utilized making the appropriate cut using the anterior tibial crest as a reference building in appropriate posterior slope.  Care was taken during the cut to protect the medial and collateral ligaments.  The proximal tibia was removed along with the posterior horns of the menisci.  The PCL was sacrificed.    The extensor gap was measured and was approximately 36mm.    I completed the distal femoral preparation using the appropriate jig to prepare the box.  The patella was then measured, and cut with the saw.    The proximal tibia sized and prepared accordingly with the reamer and the punch, and then all components were trialed with the above sized poly insert.  The knee was found to have excellent balance and full motion.    The above named components were then impacted into place and Poly tibial piece and patella were inserted.  I was very happy with his stability and ROM  I performed a periarticular injection with marcaine and toradol  The knee was easily taken through a range of motion and the patella tracked well and the knee irrigated copiously and the parapatellar and subcutaneous tissue closed with vicryl, and monocryl with steri strips for the skin.  The incision was dressed with sterile gauze and the tourniquet released and the patient was awakened and returned to the PACU in stable and satisfactory condition.  There were no complications.  Total tourniquet time was roughly 90 minutes.   POSTOPERATIVE PLAN: post op Abx, DVT px: SCD's, TED's, Early ambulation and chemical  px

## 2016-07-11 NOTE — Progress Notes (Signed)
07/11/2016- Respiratory care note- Pt has home CPAP machine which he places on himself.  No humidity used with his machine.  Pt also uses nasal pillows in place of nasal or full face mask.  Pt already wearing CPAP when respiratory checked on patient.  Tolerating well with home setting of 7 per patient.

## 2016-07-11 NOTE — Anesthesia Preprocedure Evaluation (Addendum)
Anesthesia Evaluation  Patient identified by MRN, date of birth, ID band Patient awake    Reviewed: Allergy & Precautions, NPO status , Patient's Chart, lab work & pertinent test results  Airway Mallampati: II  TM Distance: >3 FB Neck ROM: Full    Dental no notable dental hx.    Pulmonary sleep apnea , Current Smoker,    breath sounds clear to auscultation       Cardiovascular hypertension, + dysrhythmias + pacemaker  Rhythm:Regular Rate:Normal     Neuro/Psych    GI/Hepatic negative GI ROS, Neg liver ROS,   Endo/Other  negative endocrine ROS  Renal/GU negative Renal ROS     Musculoskeletal  (+) Arthritis ,   Abdominal (+) + obese,   Peds  Hematology   Anesthesia Other Findings   Reproductive/Obstetrics                            Anesthesia Physical Anesthesia Plan  ASA: III  Anesthesia Plan: Spinal   Post-op Pain Management:    Induction: Intravenous  Airway Management Planned: Simple Face Mask  Additional Equipment:   Intra-op Plan:   Post-operative Plan: Extubation in OR  Informed Consent: I have reviewed the patients History and Physical, chart, labs and discussed the procedure including the risks, benefits and alternatives for the proposed anesthesia with the patient or authorized representative who has indicated his/her understanding and acceptance.   Dental advisory given  Plan Discussed with: CRNA  Anesthesia Plan Comments:         Anesthesia Quick Evaluation

## 2016-07-11 NOTE — Transfer of Care (Signed)
Immediate Anesthesia Transfer of Care Note  Patient: Harold Robertson  Procedure(s) Performed: Procedure(s): TOTAL KNEE ARTHROPLASTY (Right)  Patient Location: PACU  Anesthesia Type:MAC and MAC combined with regional for post-op pain  Level of Consciousness: awake, alert , oriented and patient cooperative  Airway & Oxygen Therapy: Patient Spontanous Breathing  Post-op Assessment: Report given to RN and Post -op Vital signs reviewed and stable  Post vital signs: Reviewed and stable  Last Vitals:  Vitals:   07/11/16 0557 07/11/16 0558  BP: 136/80   Pulse: 60   Resp: 20   Temp:  36.7 C    Last Pain:  Vitals:   07/11/16 0605  PainSc: 2          Complications: No apparent anesthesia complications

## 2016-07-11 NOTE — Evaluation (Signed)
Physical Therapy Evaluation Patient Details Name: Harold Robertson MRN: [id number] DOB: 22-Dec-1950 Today's Date: 07/11/2016   History of Present Illness  Pt is 66 y/o male s/p elective R TKA. PMH includes sleep apnea, HTN, pacemaker placement, smoker, and s/p cardiac caths.   Clinical Impression  Pt s/p elective surgery above with deficits below. PTA, pt was using cane to ambulate on level ground and using crutches to navigate stairs. Upon evaluation, pt presenting with post op pain and weakness and required min guard for mobility tasks. Pt asking about stair navigation at home. Will need to address in following sessions to ensure pt comfort with stair navigation. Recommendations below. Will continue to follow to maximize functional mobility independence and safety.     Follow Up Recommendations Home health PT;Supervision/Assistance - 24 hour    Equipment Recommendations  Rolling walker with 5" wheels;3in1 (PT)    Recommendations for Other Services       Precautions / Restrictions Precautions Precautions: Knee Precaution Booklet Issued: Yes (comment) Precaution Comments: Supine ther ex reviewed with pt.  Restrictions Weight Bearing Restrictions: Yes RLE Weight Bearing: Weight bearing as tolerated      Mobility  Bed Mobility Overal bed mobility: Needs Assistance Bed Mobility: Sidelying to Sit   Sidelying to sit: Min guard;HOB elevated       General bed mobility comments: Min guard for safety. Verbal cues for LE sequencing during bed mobility.   Transfers Overall transfer level: Needs assistance Equipment used: Rolling walker (2 wheeled) Transfers: Sit to/from Stand Sit to Stand: Min guard         General transfer comment: Min guard for steadying upon standing. Verbal cues for appropriate LE and UE placement during transfer.   Ambulation/Gait Ambulation/Gait assistance: Min guard Ambulation Distance (Feet): 20 Feet Assistive device: Rolling walker (2 wheeled) Gait  Pattern/deviations: Step-to pattern;Decreased step length - right;Decreased weight shift to right;Antalgic Gait velocity: Decreased Gait velocity interpretation: Below normal speed for age/gender General Gait Details: Slow, antalgic gait secondary to post op pain and weakness. Verbal cues for upright posture during gait. Verbal cues for LE sequencing during gait.   Stairs            Wheelchair Mobility    Modified Rankin (Stroke Patients Only)       Balance Overall balance assessment: Needs assistance Sitting-balance support: No upper extremity supported;Feet supported Sitting balance-Leahy Scale: Good     Standing balance support: Bilateral upper extremity supported;During functional activity Standing balance-Leahy Scale: Poor Standing balance comment: Reliant on RW during mobility                              Pertinent Vitals/Pain Pain Assessment: 0-10 Pain Score: 4  Pain Location: R knee  Pain Descriptors / Indicators: Operative site guarding;Sore Pain Intervention(s): Limited activity within patient's tolerance;Monitored during session;Repositioned    Home Living Family/patient expects to be discharged to:: Private residence Living Arrangements: Spouse/significant other Available Help at Discharge: Family;Available 24 hours/day Type of Home: House Home Access: Stairs to enter Entrance Stairs-Rails: None Entrance Stairs-Number of Steps: 3 Home Layout: Two level Home Equipment: Cane - single point;Crutches      Prior Function Level of Independence: Independent with assistive device(s)         Comments: Used cane at baseline on level ground. Use crutches on steps.      Hand Dominance   Dominant Hand: Right    Extremity/Trunk Assessment   Upper Extremity Assessment  Upper Extremity Assessment: Defer to OT evaluation    Lower Extremity Assessment Lower Extremity Assessment: RLE deficits/detail RLE Deficits / Details: Sensory system in  tact. Deficits consistent with post op pain and weakness.  Pt able to perform single SLR to remove bone foam, ankle pumps, quad sets, and hip ab/adduction    Cervical / Trunk Assessment Cervical / Trunk Assessment: Kyphotic  Communication   Communication: No difficulties  Cognition Arousal/Alertness: Awake/alert Behavior During Therapy: WFL for tasks assessed/performed Overall Cognitive Status: Within Functional Limits for tasks assessed                                        General Comments General comments (skin integrity, edema, etc.): Pt worried about stair navigation and home and asking questions about stair navigation. Reviewed stair navigation technique, and educated that pt would be able to practice in later sessions.      Exercises Total Joint Exercises Ankle Circles/Pumps: AROM;Both;10 reps;Supine Quad Sets: AROM;Right;10 reps;Supine Towel Squeeze: AROM;Both;10 reps;Supine Hip ABduction/ADduction: AROM;Right;10 reps;Supine   Assessment/Plan    PT Assessment Patient needs continued PT services  PT Problem List Decreased strength;Decreased range of motion;Decreased activity tolerance;Decreased balance;Decreased mobility;Decreased knowledge of use of DME;Decreased knowledge of precautions;Pain       PT Treatment Interventions DME instruction;Gait training;Stair training;Functional mobility training;Therapeutic activities;Balance training;Therapeutic exercise;Neuromuscular re-education;Patient/family education    PT Goals (Current goals can be found in the Care Plan section)  Acute Rehab PT Goals Patient Stated Goal: to go home  PT Goal Formulation: With patient Time For Goal Achievement: 07/18/16 Potential to Achieve Goals: Good    Frequency 7X/week   Barriers to discharge        Co-evaluation               End of Session Equipment Utilized During Treatment: Gait belt;Right knee immobilizer Activity Tolerance: Patient limited by  pain Patient left: in chair;with call bell/phone within reach Nurse Communication: Mobility status PT Visit Diagnosis: Other abnormalities of gait and mobility (R26.89);Pain Pain - Right/Left: Right Pain - part of body: Knee    Time: 1025-8527 PT Time Calculation (min) (ACUTE ONLY): 31 min   Charges:   PT Evaluation $PT Eval Low Complexity: 1 Procedure PT Treatments $Gait Training: 8-22 mins   PT G Codes:        Margot Chimes, PT, DPT  Acute Rehabilitation Services  Pager: [phone]   Melvyn Novas 07/11/2016, 2:50 PM

## 2016-07-11 NOTE — Interval H&P Note (Signed)
History and Physical Interval Note:  07/11/2016 7:27 AM  Harold Robertson  has presented today for surgery, with the diagnosis of OA RIGHT KNEE  The various methods of treatment have been discussed with the patient and family. After consideration of risks, benefits and other options for treatment, the patient has consented to  Procedure(s): TOTAL KNEE ARTHROPLASTY (Right) as a surgical intervention .  The patient's history has been reviewed, patient examined, no change in status, stable for surgery.  I have reviewed the patient's chart and labs.  Questions were answered to the patient's satisfaction.     Harold Robertson D

## 2016-07-11 NOTE — Anesthesia Procedure Notes (Signed)
Spinal  Patient location during procedure: OR Start time: 07/11/2016 8:35 AM End time: 07/11/2016 7:40 AM Staffing Anesthesiologist: Linc Renne Preanesthetic Checklist Completed: patient identified, site marked, surgical consent, pre-op evaluation, timeout performed, IV checked, risks and benefits discussed and monitors and equipment checked Spinal Block Patient position: sitting Prep: Betadine Patient monitoring: heart rate, cardiac monitor, continuous pulse ox and blood pressure Approach: midline Location: L3-4 Injection technique: single-shot Needle Needle type: Pencan  Needle length: 9 cm Needle insertion depth: 7 cm Assessment Sensory level: T8

## 2016-07-12 ENCOUNTER — Encounter (HOSPITAL_COMMUNITY): Payer: Self-pay | Admitting: Orthopedic Surgery

## 2016-07-12 MED ORDER — PANTOPRAZOLE SODIUM 40 MG PO TBEC
40.0000 mg | DELAYED_RELEASE_TABLET | Freq: Every day | ORAL | Status: DC
  Administered 2016-07-13: 40 mg via ORAL
  Filled 2016-07-12: qty 1

## 2016-07-12 MED ORDER — RIVAROXABAN 10 MG PO TABS
10.0000 mg | ORAL_TABLET | Freq: Every day | ORAL | Status: DC
  Administered 2016-07-12: 10 mg via ORAL
  Filled 2016-07-12: qty 1

## 2016-07-12 MED ORDER — ASPIRIN 325 MG PO TABS
325.0000 mg | ORAL_TABLET | Freq: Every day | ORAL | Status: DC
  Administered 2016-07-13: 325 mg via ORAL
  Filled 2016-07-12: qty 1

## 2016-07-12 MED ORDER — RIVAROXABAN 10 MG PO TABS: 10.0000 mg | ORAL_TABLET | Freq: Every day | ORAL | 0 refills | Status: DC

## 2016-07-12 NOTE — Care Management Note (Signed)
Case Management Note  Patient Details  Name: Harold Robertson MRN: [id number] Date of Birth: 1951/01/17  Subjective/Objective:  66 yr old gentleman s/p right total knee arthroplasty.                    Action/Plan: Case manager spoke with patient concerning Home Health and DME needs. Patient was preoperatively setup with [facility], no changes. CM has ordered 3in1 and RW, CPM will be delivered to patient's home. He will have family support at discharge. CM is checking for co-pay for Xarelto 10 mg.    Expected Discharge Date:   07/13/16               Expected Discharge Plan:  Home w Home Health Services  In-House Referral:  NA  Discharge planning Services  CM Consult  Post Acute Care Choice:  Home Health, Durable Medical Equipment Choice offered to:  Patient  DME Arranged:  3-N-1, CPM, Walker rolling DME Agency:  [employer]  HH Arranged:  PT HH Agency:  [facility]  Status of Service:  In process, will continue to follow  If discussed at Long Length of Stay Meetings, dates discussed:    Additional Comments:  Durenda Guthrie, RN 07/12/2016, 2:37 PM

## 2016-07-12 NOTE — Progress Notes (Signed)
Orthopedic Tech Progress Note Patient Details:  Harold Robertson 1950-09-29 [id number] Off cpm at 1804 Patient ID: Harold Robertson, male   DOB: July 19, 1950, 66 y.o.   MRN: [id number]   Harold Robertson 07/12/2016, 6:04 PM

## 2016-07-12 NOTE — Progress Notes (Signed)
Physical Therapy Treatment Patient Details Name: Harold Robertson MRN: [id number] DOB: 05/17/1950 Today's Date: 07/12/2016    History of Present Illness Pt is 66 y/o male s/p elective R TKA. PMH includes sleep apnea, HTN, pacemaker placement, smoker, and s/p cardiac caths.     PT Comments    Patient able to complete HEP with use of handout and min cues. Pt is making progress with bed mobility and ROM. Continue to progress as tolerated with anticipated d/c home with HHPT.    Follow Up Recommendations  Home health PT;Supervision/Assistance - 24 hour     Equipment Recommendations  Rolling walker with 5" wheels;3in1 (PT)    Recommendations for Other Services       Precautions / Restrictions Precautions Precautions: Knee Precaution Comments: precautions/positioning reviewed with pt Restrictions Weight Bearing Restrictions: Yes RLE Weight Bearing: Weight bearing as tolerated    Mobility  Bed Mobility Overal bed mobility: Modified Independent Bed Mobility: Supine to Sit;Sit to Supine           General bed mobility comments: increased time/effort  Transfers                    Ambulation/Gait                 Stairs            Wheelchair Mobility    Modified Rankin (Stroke Patients Only)       Balance Overall balance assessment: Needs assistance Sitting-balance support: No upper extremity supported;Feet supported Sitting balance-Leahy Scale: Good                                      Cognition Arousal/Alertness: Awake/alert Behavior During Therapy: WFL for tasks assessed/performed Overall Cognitive Status: Within Functional Limits for tasks assessed                                        Exercises Total Joint Exercises Ankle Circles/Pumps: AROM;Both;15 reps Quad Sets: AROM;Right;10 reps Short Arc Quad: AROM;Right;10 reps Heel Slides: AROM;Right;10 reps Hip ABduction/ADduction: AROM;Right;10  reps Straight Leg Raises: AROM;Right;10 reps Long Arc Quad: AROM;Right;10 reps Knee Flexion: AROM;Right;20 reps;Seated;Other (comment) (10 second holds; 10 assisted with L LE) Goniometric ROM: 5-85    General Comments        Pertinent Vitals/Pain Pain Assessment: 0-10 Pain Score: 5  Pain Location: R knee  Pain Descriptors / Indicators: Aching;Grimacing Pain Intervention(s): Limited activity within patient's tolerance;Monitored during session;Repositioned;RN gave pain meds during session    Home Living                      Prior Function            PT Goals (current goals can now be found in the care plan section) Acute Rehab PT Goals Patient Stated Goal: to go home  PT Goal Formulation: With patient Time For Goal Achievement: 07/18/16 Potential to Achieve Goals: Good Progress towards PT goals: Progressing toward goals    Frequency    7X/week      PT Plan Current plan remains appropriate    Co-evaluation             End of Session   Activity Tolerance: Patient tolerated treatment well Patient left: with call bell/phone within reach;in bed;in CPM Nurse Communication:  Mobility status PT Visit Diagnosis: Other abnormalities of gait and mobility (R26.89);Pain Pain - Right/Left: Right Pain - part of body: Knee     Time: 1344-1420 PT Time Calculation (min) (ACUTE ONLY): 36 min  Charges:  $Therapeutic Exercise: 23-37 mins                    G Codes:       Erline Levine, PTA Pager: [phone]     Carolynne Edouard 07/12/2016, 4:18 PM

## 2016-07-12 NOTE — Care Management (Signed)
Pharmacist requested benefit check for patient to determine patient's   co-pay for Xarelto 80m, 1 tab daily for 21 days. Patient has not met his deductible, his co-pay would be $329.00. If Xarelto needed there after, co-pay will be $29.00. Case manager called AEbony Hail pharmacist and gave her this information. Allision states she will speak with MD before we discuss with patient.

## 2016-07-12 NOTE — Progress Notes (Signed)
Pt has home CPAP and places self on at bed time. Informed pt if he needed help to ask RN to call this RT

## 2016-07-12 NOTE — Progress Notes (Signed)
OT Cancellation Note  Patient Details Name: Harold Robertson MRN: [id number] DOB: Jun 23, 1950   Cancelled Treatment:    Reason Eval/Treat Not Completed: Other (comment) (put in CPM and wants to use machine)  Boone Master B 07/12/2016, 2:32 PM

## 2016-07-12 NOTE — Plan of Care (Signed)
Problem: Education: Goal: Knowledge of [place] Education information/materials will improve Outcome: Progressing POC reviewed with pt.   

## 2016-07-12 NOTE — Progress Notes (Signed)
   Assessment: 1 Day Post-Op  S/P Procedure(s) (LRB): TOTAL KNEE ARTHROPLASTY (Right) by Dr. Jewel Baize. Eulah Pont on 07/11/16  Principal Problem:   Primary osteoarthritis of right knee Active Problems:   Obstructive sleep apnea   Hypertension   Complete heart block (HCC)   Presence of permanent cardiac pacemaker  Plan: Up with therapy D/C IV fluids when tolerating adequate PO Incentive Spirometry Elevate and apply ice  Weight Bearing: Weight Bearing as Tolerated (WBAT)  Dressings: PRN.  VTE prophylaxis: Xarelto, SCDs, ambulation Dispo: Home w/ HHPT - Pending progress with PT today.  He has 15 stairs to get up to his main living space at home.  He may go if he makes sufficient progress with stairs during 2nd PT session today.  Otherwise, planning for d/c tomorrow 07/13/16.  Subjective: Patient reports pain as moderate. Pain controlled with IV and PO meds.  Tolerating diet.  Urinating.  +Flatus.  No CP, SOB.  OOB walking short distances so far w/ PT.  Objective:   VITALS:   Vitals:   07/11/16 1156 07/11/16 1332 07/11/16 2128 07/12/16 0542  BP: 125/75 113/75 (!) 149/76 129/76  Pulse: 67  69 (!) 58  Resp: 18  18 19   Temp: 97.5 F (36.4 C)  98.7 F (37.1 C) 98.8 F (37.1 C)  TempSrc: Oral  Oral Oral  SpO2: 99%  99% 98%   CBC Latest Ref Rng & Units 06/30/2016 05/11/2016 03/01/2016  WBC 4.0 - 10.5 K/uL 4.8 5.4 8.0  Hemoglobin 13.0 - 17.0 g/dL 92.9 57.4 12.2(A)  Hematocrit 39.0 - 52.0 % 41.0 39.5 34.2(A)  Platelets 150 - 400 K/uL 160 184 -   BMP Latest Ref Rng & Units 06/30/2016 05/11/2016 01/28/2016  Glucose 65 - 99 mg/dL 97 95 92  BUN 6 - 20 mg/dL 14 15 7   Creatinine 0.61 - 1.24 mg/dL 7.34 0.37 0.96  Sodium 135 - 145 mmol/L 138 140 136  Potassium 3.5 - 5.1 mmol/L 4.3 4.3 3.7  Chloride 101 - 111 mmol/L 109 105 106  CO2 22 - 32 mmol/L 21(L) 25 24  Calcium 8.9 - 10.3 mg/dL 9.2 9.1 4.3(C)   Intake/Output      04/17 0701 - 04/18 0700 04/18 0701 - 04/19 0700   P.O. 80    I.V.  2405    Total Intake 2485     Urine 950    Blood 50    Total Output 1000     Net +1485            Physical Exam: General: NAD.  Supine in bed.  CPAP in place.  CPM in place. Resp: No increased wob Cardio: regular rate and rhythm ABD soft Neurologically intact MSK Neurovascularly intact Sensation intact distally Feet warm Dorsiflexion/Plantar flexion intact Incision: dressing C/D/I   Albina Billet III, PA-C 07/12/2016, 7:12 AM

## 2016-07-12 NOTE — Discharge Instructions (Signed)
INSTRUCTIONS AFTER JOINT REPLACEMENT  ° °o Remove items at home which could result in a fall. This includes throw rugs or furniture in walking pathways °o ICE to the affected joint every three hours while awake for 30 minutes at a time, for at least the first 3-5 days, and then as needed for pain and swelling.  Continue to use ice for pain and swelling. You may notice swelling that will progress down to the foot and ankle.  This is normal after surgery.  Elevate your leg when you are not up walking on it.   °o Continue to use the breathing machine you got in the hospital (incentive spirometer) which will help keep your temperature down.  It is common for your temperature to cycle up and down following surgery, especially at night when you are not up moving around and exerting yourself.  The breathing machine keeps your lungs expanded and your temperature down. ° ° °DIET:  As you were doing prior to hospitalization, we recommend a well-balanced diet. ° ° °DRESSING / WOUND CARE / SHOWERING ° °Keep the surgical dressing until follow up.  IF THE DRESSING FALLS OFF or the wound gets wet inside, change the dressing with sterile gauze.  Please use good hand washing techniques before changing the dressing.  Do not use any lotions or creams on the incision until instructed by your surgeon.   ° °ACTIVITY ° °o Increase activity slowly as tolerated, but follow the weight bearing instructions below.   °o No driving for 6 weeks or until further direction given by your physician.  You cannot drive while taking narcotics.  °o No lifting or carrying greater than 10 lbs. until further directed by your surgeon. °o Avoid periods of inactivity such as sitting longer than an hour when not asleep. This helps prevent blood clots.  °o You may return to work once you are authorized by your doctor.  ° ° ° °WEIGHT BEARING  ° °Weight bearing as tolerated with assist device (walker, cane, etc) as directed, use it as long as suggested by your  surgeon or therapist, typically at least 4-6 weeks. ° ° °EXERCISES ° °Results after joint replacement surgery are often greatly improved when you follow the exercise, range of motion and muscle strengthening exercises prescribed by your doctor. Safety measures are also important to protect the joint from further injury. Any time any of these exercises cause you to have increased pain or swelling, decrease what you are doing until you are comfortable again and then slowly increase them. If you have problems or questions, call your caregiver or physical therapist for advice.  ° °Rehabilitation is important following a joint replacement. After just a few days of immobilization, the muscles of the leg can become weakened and shrink (atrophy).  These exercises are designed to build up the tone and strength of the thigh and leg muscles and to improve motion. Often times heat used for twenty to thirty minutes before working out will loosen up your tissues and help with improving the range of motion but do not use heat for the first two weeks following surgery (sometimes heat can increase post-operative swelling).  ° °These exercises can be done on a training (exercise) mat, on the floor, on a table or on a bed. Use whatever works the best and is most comfortable for you.    Use music or television while you are exercising so that the exercises are a pleasant break in your day. This will make your   life better with the exercises acting as a break in your routine that you can look forward to.   Perform all exercises about fifteen times, three times per day or as directed.  You should exercise both the operative leg and the other leg as well. ° °Exercises include: °  °• Quad Sets - Tighten up the muscle on the front of the thigh (Quad) and hold for 5-10 seconds.   °• Straight Leg Raises - With your knee straight (if you were given a brace, keep it on), lift the leg to 60 degrees, hold for 3 seconds, and slowly lower the leg.   Perform this exercise against resistance later as your leg gets stronger.  °• Leg Slides: Lying on your back, slowly slide your foot toward your buttocks, bending your knee up off the floor (only go as far as is comfortable). Then slowly slide your foot back down until your leg is flat on the floor again.  °• Angel Wings: Lying on your back spread your legs to the side as far apart as you can without causing discomfort.  °• Hamstring Strength:  Lying on your back, push your heel against the floor with your leg straight by tightening up the muscles of your buttocks.  Repeat, but this time bend your knee to a comfortable angle, and push your heel against the floor.  You may put a pillow under the heel to make it more comfortable if necessary.  ° °A rehabilitation program following joint replacement surgery can speed recovery and prevent re-injury in the future due to weakened muscles. Contact your doctor or a physical therapist for more information on knee rehabilitation.  ° ° °CONSTIPATION ° °Constipation is defined medically as fewer than three stools per week and severe constipation as less than one stool per week.  Even if you have a regular bowel pattern at home, your normal regimen is likely to be disrupted due to multiple reasons following surgery.  Combination of anesthesia, postoperative narcotics, change in appetite and fluid intake all can affect your bowels.  ° °YOU MUST use at least one of the following options; they are listed in order of increasing strength to get the job done.  They are all available over the counter, and you may need to use some, POSSIBLY even all of these options:   ° °Drink plenty of fluids (prune juice may be helpful) and high fiber foods °Colace 100 mg by mouth twice a day  °Senokot for constipation as directed and as needed Dulcolax (bisacodyl), take with full glass of water  °Miralax (polyethylene glycol) once or twice a day as needed. ° °If you have tried all these things and  are unable to have a bowel movement in the first 3-4 days after surgery call either your surgeon or your primary doctor.   ° °If you experience loose stools or diarrhea, hold the medications until you stool forms back up.  If your symptoms do not get better within 1 week or if they get worse, check with your doctor.  If you experience "the worst abdominal pain ever" or develop nausea or vomiting, please contact the office immediately for further recommendations for treatment. ° ° °ITCHING:  If you experience itching with your medications, try taking only a single pain pill, or even half a pain pill at a time.  You can also use Benadryl over the counter for itching or also to help with sleep.  ° °TED HOSE STOCKINGS:  Use stockings on both   legs until for at least 2 weeks or as directed by physician office. They may be removed at night for sleeping. ° °MEDICATIONS:  See your medication summary on the “After Visit Summary” that nursing will review with you.  You may have some home medications which will be placed on hold until you complete the course of blood thinner medication.  It is important for you to complete the blood thinner medication as prescribed. ° °PRECAUTIONS:  If you experience chest pain or shortness of breath - call 911 immediately for transfer to the hospital emergency department.  ° °If you develop a fever greater that 101 F, purulent drainage from wound, increased redness or drainage from wound, foul odor from the wound/dressing, or calf pain - CONTACT YOUR SURGEON.   °                                                °FOLLOW-UP APPOINTMENTS:  If you do not already have a post-op appointment, please call the office for an appointment to be seen by your surgeon.  Guidelines for how soon to be seen are listed in your “After Visit Summary”, but are typically between 1-4 weeks after surgery. ° °OTHER INSTRUCTIONS:  ° °Knee Replacement:  Do not place pillow under knee, focus on keeping the knee straight  while resting. CPM instructions: 0-90 degrees, 2 hours in the morning, 2 hours in the afternoon, and 2 hours in the evening. Place foam block, curve side up under heel at all times except when in CPM or when walking.  DO NOT modify, tear, cut, or change the foam block in any way. ° °MAKE SURE YOU:  °• Understand these instructions.  °• Get help right away if you are not doing well or get worse.  ° ° °Thank you for letting us be a part of your medical care team.  It is a privilege we respect greatly.  We hope these instructions will help you stay on track for a fast and full recovery!  ° °Information on my medicine - XARELTO® (Rivaroxaban) ° °Why was Xarelto® prescribed for you? °Xarelto® was prescribed for you to reduce the risk of blood clots forming after orthopedic surgery. The medical term for these abnormal blood clots is venous thromboembolism (VTE). ° °What do you need to know about xarelto® ? °Take your Xarelto® ONCE DAILY at the same time every day. °You may take it either with or without food. ° °If you have difficulty swallowing the tablet whole, you may crush it and mix in applesauce just prior to taking your dose. ° °Take Xarelto® exactly as prescribed by your doctor and DO NOT stop taking Xarelto® without talking to the doctor who prescribed the medication.  Stopping without other VTE prevention medication to take the place of Xarelto® may increase your risk of developing a clot. ° °After discharge, you should have regular check-up appointments with your healthcare provider that is prescribing your Xarelto®.   ° °What do you do if you miss a dose? °If you miss a dose, take it as soon as you remember on the same day then continue your regularly scheduled once daily regimen the next day. Do not take two doses of Xarelto® on the same day.  ° °Important Safety Information °A possible side effect of Xarelto® is bleeding. You should call your healthcare provider right away   if you experience any of the  following: °? Bleeding from an injury or your nose that does not stop. °? Unusual colored urine (red or dark brown) or unusual colored stools (red or black). °? Unusual bruising for unknown reasons. °? A serious fall or if you hit your head (even if there is no bleeding). ° °Some medicines may interact with Xarelto® and might increase your risk of bleeding while on Xarelto®. To help avoid this, consult your healthcare provider or pharmacist prior to using any new prescription or non-prescription medications, including herbals, vitamins, non-steroidal anti-inflammatory drugs (NSAIDs) and supplements. ° °This website has more information on Xarelto®: www.xarelto.com. ° ° ° ° °

## 2016-07-12 NOTE — Progress Notes (Signed)
Orthopedic Tech Progress Note Patient Details:  Harold Robertson 1950-08-14 [id number]  Patient ID: Harold Robertson, male   DOB: [birth year], 66 y.o.   MRN: [id number]   Harold Robertson 07/12/2016, 1:44 PM Placed pt's rle on cpm @ 0-60 degrees @1345 ; RN notified

## 2016-07-12 NOTE — Progress Notes (Signed)
Physical Therapy Treatment Patient Details Name: Harold Robertson MRN: [id number] DOB: [birth year] Today's Date: 07/12/2016    History of Present Illness Pt is 66 y/o male s/p elective R TKA. PMH includes sleep apnea, HTN, pacemaker placement, smoker, and s/p cardiac caths.     PT Comments    Patient is progressing toward mobility goals. Pt able to increase gait distance and safely negotiate stairs. Continue to progress as tolerated with anticipated d/c home with HHPT.    Follow Up Recommendations  Home health PT;Supervision/Assistance - 24 hour     Equipment Recommendations  Rolling walker with 5" wheels;3in1 (PT)    Recommendations for Other Services       Precautions / Restrictions Precautions Precautions: Knee Precaution Comments: precautions/positioning reviewed with pt and wife Restrictions Weight Bearing Restrictions: Yes RLE Weight Bearing: Weight bearing as tolerated    Mobility  Bed Mobility Overal bed mobility: Modified Independent Bed Mobility: Supine to Sit           General bed mobility comments: cues for hand placement and technique  Transfers Overall transfer level: Needs assistance Equipment used: Rolling walker (2 wheeled) Transfers: Sit to/from Stand Sit to Stand: Min guard         General transfer comment: Min guard for steadying upon standing. Verbal cues for appropriate LE and UE placement during transfer.   Ambulation/Gait Ambulation/Gait assistance: Min guard Ambulation Distance (Feet):  (59ft X2) Assistive device: Rolling walker (2 wheeled) Gait Pattern/deviations: Decreased weight shift to right;Step-through pattern;Decreased stance time - right;Decreased step length - left Gait velocity: Decreased   General Gait Details: cues for posture, sequencing, and increased engagement of R quad during stance phase   Stairs Stairs: Yes   Stair Management: No rails;Backwards;With walker;One rail Right;Sideways Number of Stairs:  (3 then  12) General stair comments: cues for sequencing and technique; wife present; assist to stabilize RW when ascending 3 steps backwards; 12 steps sideways  Wheelchair Mobility    Modified Rankin (Stroke Patients Only)       Balance Overall balance assessment: Needs assistance Sitting-balance support: No upper extremity supported;Feet supported Sitting balance-Leahy Scale: Good     Standing balance support: Bilateral upper extremity supported;During functional activity Standing balance-Leahy Scale: Poor Standing balance comment: Reliant on RW during mobility                             Cognition Arousal/Alertness: Awake/alert Behavior During Therapy: WFL for tasks assessed/performed Overall Cognitive Status: Within Functional Limits for tasks assessed                                        Exercises      General Comments        Pertinent Vitals/Pain Pain Assessment: Faces Faces Pain Scale: Hurts little more Pain Location: R knee  Pain Descriptors / Indicators: Aching;Grimacing;Guarding Pain Intervention(s): Limited activity within patient's tolerance;Monitored during session;Premedicated before session;Repositioned    Home Living                      Prior Function            PT Goals (current goals can now be found in the care plan section) Acute Rehab PT Goals Patient Stated Goal: to go home  PT Goal Formulation: With patient Time For Goal Achievement: 07/18/16 Potential to Achieve Goals:  Good Progress towards PT goals: Progressing toward goals    Frequency    7X/week      PT Plan Current plan remains appropriate    Co-evaluation             End of Session Equipment Utilized During Treatment: Gait belt Activity Tolerance: Patient tolerated treatment well Patient left: in chair;with call bell/phone within reach;with family/visitor present Nurse Communication: Mobility status PT Visit Diagnosis: Other  abnormalities of gait and mobility (R26.89);Pain Pain - Right/Left: Right Pain - part of body: Knee     Time: 9604-5409 PT Time Calculation (min) (ACUTE ONLY): 39 min  Charges:  $Gait Training: 23-37 mins $Therapeutic Activity: 8-22 mins                    G Codes:       Erline Levine, PTA Pager: [phone]     Carolynne Edouard 07/12/2016, 10:46 AM

## 2016-07-13 MED ORDER — ASPIRIN EC 325 MG PO TBEC: 325.0000 mg | DELAYED_RELEASE_TABLET | Freq: Every day | ORAL | 0 refills | Status: DC

## 2016-07-13 MED ORDER — OMEPRAZOLE 20 MG PO CPDR
20.0000 mg | DELAYED_RELEASE_CAPSULE | Freq: Every day | ORAL | 2 refills | Status: DC
Start: 2016-07-13 — End: 2016-11-16

## 2016-07-13 NOTE — Progress Notes (Signed)
Pt ready for d/c home today per MD. Pt met PT goals, however he refused OT because he "already knew what to do." Discharge instructions and prescriptions reviewed with pt and spouse by [facility] nurse.

## 2016-07-13 NOTE — Progress Notes (Signed)
Physical Therapy Treatment Patient Details Name: Harold Robertson MRN: [id number] DOB: 02-20-1951 Today's Date: 07/13/2016    History of Present Illness Pt is 66 y/o male s/p elective R TKA. PMH includes sleep apnea, HTN, pacemaker placement, smoker, and s/p cardiac caths.     PT Comments    Patient is making good progress with PT.  From a mobility standpoint anticipate patient will be ready for DC home when medically ready.    Follow Up Recommendations  Home health PT;Supervision/Assistance - 24 hour     Equipment Recommendations  Rolling walker with 5" wheels;3in1 (PT)    Recommendations for Other Services       Precautions / Restrictions Precautions Precautions: Knee Precaution Comments: precautions/positioning reviewed with pt Restrictions Weight Bearing Restrictions: Yes RLE Weight Bearing: Weight bearing as tolerated    Mobility  Bed Mobility Overal bed mobility: Independent                Transfers Overall transfer level: Modified independent Equipment used: Rolling walker (2 wheeled) Transfers: Sit to/from Stand           General transfer comment: increased time/effort  Ambulation/Gait Ambulation/Gait assistance: Supervision Ambulation Distance (Feet): 160 Feet Assistive device: Rolling walker (2 wheeled) Gait Pattern/deviations: Step-through pattern;Decreased stance time - right;Decreased stride length;Decreased weight shift to right     General Gait Details: cues for posture; pt with improved gait mechanics and weightbearing    Stairs            Wheelchair Mobility    Modified Rankin (Stroke Patients Only)       Balance Overall balance assessment: Needs assistance Sitting-balance support: No upper extremity supported;Feet supported Sitting balance-Leahy Scale: Good       Standing balance-Leahy Scale: Fair Standing balance comment: able to static stand without UE support                            Cognition  Arousal/Alertness: Awake/alert Behavior During Therapy: WFL for tasks assessed/performed Overall Cognitive Status: Within Functional Limits for tasks assessed                                        Exercises Total Joint Exercises Quad Sets: AROM;Right;10 reps Towel Squeeze: AROM;10 reps Short Arc Quad: AROM;Right;10 reps Heel Slides: AROM;Right;15 reps Hip ABduction/ADduction: AROM;Right;10 reps Straight Leg Raises: AROM;Right;15 reps Long Arc Quad: AROM;Right;15 reps;Seated Knee Flexion: AROM;Right;20 reps;Seated;Other (comment) (10 second holds; 10 assisted with L LE) Goniometric ROM: approx. 5-90    General Comments        Pertinent Vitals/Pain Pain Assessment: Faces Faces Pain Scale: Hurts little more Pain Location: R knee with flexion therex Pain Descriptors / Indicators: Aching;Grimacing Pain Intervention(s): Limited activity within patient's tolerance;Monitored during session;Premedicated before session;Repositioned    Home Living                      Prior Function            PT Goals (current goals can now be found in the care plan section) Acute Rehab PT Goals Patient Stated Goal: to go home  PT Goal Formulation: With patient Time For Goal Achievement: 07/18/16 Potential to Achieve Goals: Good Progress towards PT goals: Progressing toward goals    Frequency    7X/week      PT Plan Current plan remains appropriate  Co-evaluation             End of Session Equipment Utilized During Treatment: Gait belt Activity Tolerance: Patient tolerated treatment well Patient left: with call bell/phone within reach;in chair Nurse Communication: Mobility status PT Visit Diagnosis: Other abnormalities of gait and mobility (R26.89);Pain Pain - Right/Left: Right Pain - part of body: Knee     Time: 0321-2248 PT Time Calculation (min) (ACUTE ONLY): 42 min  Charges:  $Gait Training: 8-22 mins $Therapeutic Exercise: 23-37  mins                    G Codes:       Erline Levine, PTA Pager: [phone]     Carolynne Edouard 07/13/2016, 11:03 AM

## 2016-07-13 NOTE — Progress Notes (Signed)
   Assessment: 2 Days Post-Op  S/P Procedure(s) (LRB): TOTAL KNEE ARTHROPLASTY (Right) by Dr. Jewel Baize. Murphy on 07/11/16  Principal Problem:   Primary osteoarthritis of right knee Active Problems:   Obstructive sleep apnea   Hypertension   Complete heart block (HCC)   Presence of permanent cardiac pacemaker  Plan: Home today.  Up with therapy Incentive Spirometry Elevate and apply ice  Weight Bearing: Weight Bearing as Tolerated (WBAT)  Dressings: PRN.  VTE prophylaxis: Aspirin, SCDs, ambulation.  Discussed change of VTE prophylaxis with patient and pharmacy. Dispo: Home w/ HHPT   Subjective: Patient reports pain as moderate. Pain controlled with PO meds.  Tolerating diet.  Urinating.  +Flatus.  No CP, SOB.  OOB walking and stairs PT.  Objective:   VITALS:   Vitals:   07/11/16 2128 07/12/16 0542 07/12/16 2123 07/13/16 0520  BP: (!) 149/76 129/76 117/78 137/80  Pulse: 69 (!) 58 80 73  Resp: 18 19 18 18   Temp: 98.7 F (37.1 C) 98.8 F (37.1 C) 98.3 F (36.8 C) 98.1 F (36.7 C)  TempSrc: Oral Oral Oral Oral  SpO2: 99% 98% 97% 98%   CBC Latest Ref Rng & Units 06/30/2016 05/11/2016 03/01/2016  WBC 4.0 - 10.5 K/uL 4.8 5.4 8.0  Hemoglobin 13.0 - 17.0 g/dL 75.6 43.3 12.2(A)  Hematocrit 39.0 - 52.0 % 41.0 39.5 34.2(A)  Platelets 150 - 400 K/uL 160 184 -   BMP Latest Ref Rng & Units 06/30/2016 05/11/2016 01/28/2016  Glucose 65 - 99 mg/dL 97 95 92  BUN 6 - 20 mg/dL 14 15 7   Creatinine 0.61 - 1.24 mg/dL 2.95 1.88 4.16  Sodium 135 - 145 mmol/L 138 140 136  Potassium 3.5 - 5.1 mmol/L 4.3 4.3 3.7  Chloride 101 - 111 mmol/L 109 105 106  CO2 22 - 32 mmol/L 21(L) 25 24  Calcium 8.9 - 10.3 mg/dL 9.2 9.1 6.0(Y)   Intake/Output      04/18 0701 - 04/19 0700 04/19 0701 - 04/20 0700   P.O.     I.V.     Total Intake       Urine 3100    Blood     Total Output 3100     Net -3100            Physical Exam: General: NAD.  Supine in bed. CPM in place. Resp: No increased  wob Cardio: regular rate and rhythm ABD soft Neurologically intact MSK Neurovascularly intact Sensation intact distally Feet warm Dorsiflexion/Plantar flexion intact Incision: dressing C/D/I   Albina Billet III, PA-C 07/13/2016, 7:25 AM

## 2016-07-13 NOTE — Discharge Summary (Signed)
Discharge Summary  Patient ID: Harold Robertson MRN: [id number] DOB/AGE: 65-18-1952 65 y.o.  Admit date: 07/11/2016 Discharge date: 07/13/2016  Admission Diagnoses:  Primary osteoarthritis of right knee  Discharge Diagnoses:  Principal Problem:   Primary osteoarthritis of right knee Active Problems:   Obstructive sleep apnea   Hypertension   Complete heart block (HCC)   Presence of permanent cardiac pacemaker   Past Medical History:  Diagnosis Date  . Arthritis   . Complete heart block (HCC)   . Hypertension   . OSA on CPAP   . Presence of permanent cardiac pacemaker     Surgeries: Procedure(s): TOTAL KNEE ARTHROPLASTY on 07/11/2016   Consultants (if any):   Discharged Condition: Improved  Hospital Course: Harold Robertson is an 66 y.o. male who was admitted 07/11/2016 with a diagnosis of Primary osteoarthritis of right knee and went to the operating room on 07/11/2016 and underwent the above named procedures.    He was given perioperative antibiotics:  Anti-infectives    Start     Dose/Rate Route Frequency Ordered Stop   07/11/16 1400  ceFAZolin (ANCEF) IVPB 2g/100 mL premix     2 g 200 mL/hr over 30 Minutes Intravenous Every 6 hours 07/11/16 1217 07/11/16 2031   07/11/16 0715  ceFAZolin (ANCEF) 3 g in dextrose 5 % 50 mL IVPB     3 g 130 mL/hr over 30 Minutes Intravenous On call to O.R. 07/10/16 5035 07/11/16 0754    .  He was given sequential compression devices, early ambulation, and Aspirin for DVT prophylaxis.  He benefited maximally from the hospital stay and there were no complications.    Recent vital signs:  Vitals:   07/12/16 2123 07/13/16 0520  BP: 117/78 137/80  Pulse: 80 73  Resp: 18 18  Temp: 98.3 F (36.8 C) 98.1 F (36.7 C)    Recent laboratory studies:  Lab Results  Component Value Date   HGB 14.0 06/30/2016   HGB 13.1 05/11/2016   HGB 12.2 (A) 03/01/2016   Lab Results  Component Value Date   WBC 4.8 06/30/2016   PLT 160  06/30/2016   Lab Results  Component Value Date   INR 1.22 09/09/2015   Lab Results  Component Value Date   NA 138 06/30/2016   K 4.3 06/30/2016   CL 109 06/30/2016   CO2 21 (L) 06/30/2016   BUN 14 06/30/2016   CREATININE 0.97 06/30/2016   GLUCOSE 97 06/30/2016    Discharge Medications:   Allergies as of 07/13/2016      Reactions   No Known Allergies       Medication List    STOP taking these medications   aspirin 81 MG tablet Replaced by:  aspirin EC 325 MG tablet     TAKE these medications   aspirin EC 325 MG tablet Take 1 tablet (325 mg total) by mouth daily. For 30 days post op for DVT Prophylaxis Replaces:  aspirin 81 MG tablet   atorvastatin 40 MG tablet Commonly known as:  LIPITOR TAKE 1 TABLET BY MOUTH EVERY DAY AT 6PM What changed:  See the new instructions.   carvedilol 12.5 MG tablet Commonly known as:  COREG Take 1 tablet (12.5 mg total) by mouth 2 (two) times daily.   docusate sodium 100 MG capsule Commonly known as:  COLACE Take 1 capsule (100 mg total) by mouth 2 (two) times daily. To prevent constipation while taking pain medication.   DUEXIS 800-26.6 MG Tabs Generic drug:  Ibuprofen-Famotidine Take 1 tablet by mouth daily.   lisinopril 40 MG tablet Commonly known as:  PRINIVIL,ZESTRIL Take 1 tablet (40 mg total) by mouth daily.   methocarbamol 500 MG tablet Commonly known as:  ROBAXIN Take 1 tablet (500 mg total) by mouth every 6 (six) hours as needed for muscle spasms.   omeprazole 20 MG capsule Commonly known as:  PRILOSEC Take 1 capsule (20 mg total) by mouth daily. While taking anti inflammatory medicine daily   ondansetron 4 MG tablet Commonly known as:  ZOFRAN Take 1 tablet (4 mg total) by mouth every 8 (eight) hours as needed for nausea or vomiting.   oxyCODONE-acetaminophen 5-325 MG tablet Commonly known as:  ROXICET Take 1-2 tablets by mouth every 4 (four) hours as needed for severe pain.   Vitamin D (Ergocalciferol)  50000 units Caps capsule Commonly known as:  DRISDOL Take 1 capsule (50,000 Units total) by mouth every 7 (seven) days. Vit D What changed:  additional instructions            Durable Medical Equipment        Start     Ordered   07/12/16 1453  For home use only DME 3 n 1  Once     07/12/16 1454   07/12/16 1453  For home use only DME Walker rolling  Once    Question:  Patient needs a walker to treat with the following condition  Answer:  S/P total knee arthroplasty, right   07/12/16 1454      Diagnostic Studies: Dg Knee Right Port  Result Date: 07/11/2016 CLINICAL DATA:  Postop right knee replacement EXAM: PORTABLE RIGHT KNEE - 1-2 VIEW COMPARISON:  03/01/2016 FINDINGS: Patient is status post tricompartmental knee replacement. No evidence for immediate hardware complications. Gas in the overlying soft tissues is compatible with the immediate postoperative state. IMPRESSION: Status post tricompartmental knee replacement without evidence for immediate hardware complications. Electronically Signed   By: Kennith Center M.D.   On: 07/11/2016 11:23    Disposition: 01-Home or Self Care    Follow-up Information    MURPHY, TIMOTHY D, MD Follow up.   Specialty:  Orthopedic Surgery Contact information: [address] [place] [address] [phone]        [facility] Follow up.   Specialty:  Home Health Services Why:  A representative from [facility] will contact you to arrange start date and time for your therapy.  Contact information: [address] [place] [address] [phone]            Signed: Albina Billet III PA-C 07/13/2016, 7:28 AM

## 2016-07-14 NOTE — Anesthesia Postprocedure Evaluation (Addendum)
Anesthesia Post Note  Patient: Harold Robertson  Procedure(s) Performed: Procedure(s) (LRB): TOTAL KNEE ARTHROPLASTY (Right)  Patient location during evaluation: PACU Anesthesia Type: Spinal Level of consciousness: oriented and awake and alert Pain management: pain level controlled Vital Signs Assessment: post-procedure vital signs reviewed and stable Respiratory status: spontaneous breathing, respiratory function stable and patient connected to nasal cannula oxygen Cardiovascular status: blood pressure returned to baseline and stable Postop Assessment: no headache and no backache Anesthetic complications: no       Last Vitals:  Vitals:   07/13/16 0520 07/13/16 0811  BP: 137/80 138/69  Pulse: 73 67  Resp: 18 18  Temp: 36.7 C 36.7 C    Last Pain:  Vitals:   07/13/16 1207  TempSrc:   PainSc: 4                  Andrea Ferrer,JAMES TERRILL

## 2016-07-15 DIAGNOSIS — Z950 Presence of cardiac pacemaker: Secondary | ICD-10-CM | POA: Diagnosis not present

## 2016-07-15 DIAGNOSIS — I442 Atrioventricular block, complete: Secondary | ICD-10-CM | POA: Diagnosis not present

## 2016-07-15 DIAGNOSIS — Z96651 Presence of right artificial knee joint: Secondary | ICD-10-CM | POA: Diagnosis not present

## 2016-07-15 DIAGNOSIS — Z471 Aftercare following joint replacement surgery: Secondary | ICD-10-CM | POA: Diagnosis not present

## 2016-07-15 DIAGNOSIS — I2510 Atherosclerotic heart disease of native coronary artery without angina pectoris: Secondary | ICD-10-CM | POA: Diagnosis not present

## 2016-07-15 DIAGNOSIS — I10 Essential (primary) hypertension: Secondary | ICD-10-CM | POA: Diagnosis not present

## 2016-07-17 DIAGNOSIS — Z471 Aftercare following joint replacement surgery: Secondary | ICD-10-CM | POA: Diagnosis not present

## 2016-07-17 DIAGNOSIS — Z96651 Presence of right artificial knee joint: Secondary | ICD-10-CM | POA: Diagnosis not present

## 2016-07-17 DIAGNOSIS — I442 Atrioventricular block, complete: Secondary | ICD-10-CM | POA: Diagnosis not present

## 2016-07-17 DIAGNOSIS — I2510 Atherosclerotic heart disease of native coronary artery without angina pectoris: Secondary | ICD-10-CM | POA: Diagnosis not present

## 2016-07-17 DIAGNOSIS — I10 Essential (primary) hypertension: Secondary | ICD-10-CM | POA: Diagnosis not present

## 2016-07-17 DIAGNOSIS — Z950 Presence of cardiac pacemaker: Secondary | ICD-10-CM | POA: Diagnosis not present

## 2016-07-19 ENCOUNTER — Other Ambulatory Visit: Payer: Self-pay

## 2016-07-19 DIAGNOSIS — Z471 Aftercare following joint replacement surgery: Secondary | ICD-10-CM | POA: Diagnosis not present

## 2016-07-19 DIAGNOSIS — I2510 Atherosclerotic heart disease of native coronary artery without angina pectoris: Secondary | ICD-10-CM | POA: Diagnosis not present

## 2016-07-19 DIAGNOSIS — Z96651 Presence of right artificial knee joint: Secondary | ICD-10-CM | POA: Diagnosis not present

## 2016-07-19 DIAGNOSIS — I10 Essential (primary) hypertension: Secondary | ICD-10-CM | POA: Diagnosis not present

## 2016-07-19 DIAGNOSIS — I442 Atrioventricular block, complete: Secondary | ICD-10-CM | POA: Diagnosis not present

## 2016-07-19 DIAGNOSIS — Z950 Presence of cardiac pacemaker: Secondary | ICD-10-CM | POA: Diagnosis not present

## 2016-07-19 MED ORDER — VITAMIN D 1000 UNITS PO TABS: 1000.0000 [IU] | ORAL_TABLET | Freq: Every day | ORAL | 1 refills | Status: DC

## 2016-07-19 NOTE — Telephone Encounter (Signed)
Fax came through from [facility] on 4-24 for a refill on Vitamin D 50,000 units. When I looked back at patient lab work the vitamin d was to be started at 1000 units daily.   I changed the vitamin D dose back to what it was originally suppose to be on 05-12-2016

## 2016-07-21 DIAGNOSIS — Z96651 Presence of right artificial knee joint: Secondary | ICD-10-CM | POA: Diagnosis not present

## 2016-07-21 DIAGNOSIS — Z950 Presence of cardiac pacemaker: Secondary | ICD-10-CM | POA: Diagnosis not present

## 2016-07-21 DIAGNOSIS — Z471 Aftercare following joint replacement surgery: Secondary | ICD-10-CM | POA: Diagnosis not present

## 2016-07-21 DIAGNOSIS — I442 Atrioventricular block, complete: Secondary | ICD-10-CM | POA: Diagnosis not present

## 2016-07-21 DIAGNOSIS — I2510 Atherosclerotic heart disease of native coronary artery without angina pectoris: Secondary | ICD-10-CM | POA: Diagnosis not present

## 2016-07-21 DIAGNOSIS — I10 Essential (primary) hypertension: Secondary | ICD-10-CM | POA: Diagnosis not present

## 2016-07-24 DIAGNOSIS — I2510 Atherosclerotic heart disease of native coronary artery without angina pectoris: Secondary | ICD-10-CM | POA: Diagnosis not present

## 2016-07-24 DIAGNOSIS — Z950 Presence of cardiac pacemaker: Secondary | ICD-10-CM | POA: Diagnosis not present

## 2016-07-24 DIAGNOSIS — Z471 Aftercare following joint replacement surgery: Secondary | ICD-10-CM | POA: Diagnosis not present

## 2016-07-24 DIAGNOSIS — I10 Essential (primary) hypertension: Secondary | ICD-10-CM | POA: Diagnosis not present

## 2016-07-24 DIAGNOSIS — Z96651 Presence of right artificial knee joint: Secondary | ICD-10-CM | POA: Diagnosis not present

## 2016-07-24 DIAGNOSIS — I442 Atrioventricular block, complete: Secondary | ICD-10-CM | POA: Diagnosis not present

## 2016-07-26 DIAGNOSIS — R262 Difficulty in walking, not elsewhere classified: Secondary | ICD-10-CM | POA: Diagnosis not present

## 2016-07-26 DIAGNOSIS — Z96651 Presence of right artificial knee joint: Secondary | ICD-10-CM | POA: Diagnosis not present

## 2016-07-26 DIAGNOSIS — M25661 Stiffness of right knee, not elsewhere classified: Secondary | ICD-10-CM | POA: Diagnosis not present

## 2016-07-26 DIAGNOSIS — M1711 Unilateral primary osteoarthritis, right knee: Secondary | ICD-10-CM | POA: Diagnosis not present

## 2016-07-26 DIAGNOSIS — M25561 Pain in right knee: Secondary | ICD-10-CM | POA: Diagnosis not present

## 2016-07-28 DIAGNOSIS — R262 Difficulty in walking, not elsewhere classified: Secondary | ICD-10-CM | POA: Diagnosis not present

## 2016-07-28 DIAGNOSIS — M25561 Pain in right knee: Secondary | ICD-10-CM | POA: Diagnosis not present

## 2016-07-28 DIAGNOSIS — Z96651 Presence of right artificial knee joint: Secondary | ICD-10-CM | POA: Diagnosis not present

## 2016-07-28 DIAGNOSIS — M25661 Stiffness of right knee, not elsewhere classified: Secondary | ICD-10-CM | POA: Diagnosis not present

## 2016-08-01 DIAGNOSIS — M25661 Stiffness of right knee, not elsewhere classified: Secondary | ICD-10-CM | POA: Diagnosis not present

## 2016-08-01 DIAGNOSIS — Z96651 Presence of right artificial knee joint: Secondary | ICD-10-CM | POA: Diagnosis not present

## 2016-08-01 DIAGNOSIS — R262 Difficulty in walking, not elsewhere classified: Secondary | ICD-10-CM | POA: Diagnosis not present

## 2016-08-01 DIAGNOSIS — M25561 Pain in right knee: Secondary | ICD-10-CM | POA: Diagnosis not present

## 2016-08-03 DIAGNOSIS — M25661 Stiffness of right knee, not elsewhere classified: Secondary | ICD-10-CM | POA: Diagnosis not present

## 2016-08-03 DIAGNOSIS — R262 Difficulty in walking, not elsewhere classified: Secondary | ICD-10-CM | POA: Diagnosis not present

## 2016-08-03 DIAGNOSIS — Z96651 Presence of right artificial knee joint: Secondary | ICD-10-CM | POA: Diagnosis not present

## 2016-08-03 DIAGNOSIS — M25561 Pain in right knee: Secondary | ICD-10-CM | POA: Diagnosis not present

## 2016-08-15 DIAGNOSIS — Z96651 Presence of right artificial knee joint: Secondary | ICD-10-CM | POA: Diagnosis not present

## 2016-08-15 DIAGNOSIS — R262 Difficulty in walking, not elsewhere classified: Secondary | ICD-10-CM | POA: Diagnosis not present

## 2016-08-15 DIAGNOSIS — M25661 Stiffness of right knee, not elsewhere classified: Secondary | ICD-10-CM | POA: Diagnosis not present

## 2016-08-15 DIAGNOSIS — M25561 Pain in right knee: Secondary | ICD-10-CM | POA: Diagnosis not present

## 2016-08-17 DIAGNOSIS — Z96651 Presence of right artificial knee joint: Secondary | ICD-10-CM | POA: Diagnosis not present

## 2016-08-17 DIAGNOSIS — R262 Difficulty in walking, not elsewhere classified: Secondary | ICD-10-CM | POA: Diagnosis not present

## 2016-08-17 DIAGNOSIS — M25561 Pain in right knee: Secondary | ICD-10-CM | POA: Diagnosis not present

## 2016-08-17 DIAGNOSIS — M25661 Stiffness of right knee, not elsewhere classified: Secondary | ICD-10-CM | POA: Diagnosis not present

## 2016-08-22 ENCOUNTER — Other Ambulatory Visit: Payer: Self-pay | Admitting: Cardiology

## 2016-08-22 ENCOUNTER — Other Ambulatory Visit: Payer: Self-pay | Admitting: Physician Assistant

## 2016-08-22 DIAGNOSIS — M25561 Pain in right knee: Secondary | ICD-10-CM | POA: Diagnosis not present

## 2016-08-22 DIAGNOSIS — Z96651 Presence of right artificial knee joint: Secondary | ICD-10-CM | POA: Diagnosis not present

## 2016-08-22 DIAGNOSIS — M25661 Stiffness of right knee, not elsewhere classified: Secondary | ICD-10-CM | POA: Diagnosis not present

## 2016-08-22 DIAGNOSIS — R262 Difficulty in walking, not elsewhere classified: Secondary | ICD-10-CM | POA: Diagnosis not present

## 2016-08-24 DIAGNOSIS — R262 Difficulty in walking, not elsewhere classified: Secondary | ICD-10-CM | POA: Diagnosis not present

## 2016-08-24 DIAGNOSIS — M25661 Stiffness of right knee, not elsewhere classified: Secondary | ICD-10-CM | POA: Diagnosis not present

## 2016-08-24 DIAGNOSIS — Z96651 Presence of right artificial knee joint: Secondary | ICD-10-CM | POA: Diagnosis not present

## 2016-08-24 DIAGNOSIS — M25561 Pain in right knee: Secondary | ICD-10-CM | POA: Diagnosis not present

## 2016-08-25 DIAGNOSIS — M25561 Pain in right knee: Secondary | ICD-10-CM | POA: Diagnosis not present

## 2016-08-25 NOTE — Addendum Note (Signed)
Addendum  created 08/25/16 1352 by Sharee Holster, MD   Sign clinical note

## 2016-08-29 ENCOUNTER — Telehealth: Payer: Self-pay | Admitting: Cardiology

## 2016-08-29 ENCOUNTER — Ambulatory Visit (INDEPENDENT_AMBULATORY_CARE_PROVIDER_SITE_OTHER): Payer: Medicare Other | Admitting: *Deleted

## 2016-08-29 DIAGNOSIS — Z96651 Presence of right artificial knee joint: Secondary | ICD-10-CM | POA: Diagnosis not present

## 2016-08-29 DIAGNOSIS — M25661 Stiffness of right knee, not elsewhere classified: Secondary | ICD-10-CM | POA: Diagnosis not present

## 2016-08-29 DIAGNOSIS — R262 Difficulty in walking, not elsewhere classified: Secondary | ICD-10-CM | POA: Diagnosis not present

## 2016-08-29 DIAGNOSIS — M25561 Pain in right knee: Secondary | ICD-10-CM | POA: Diagnosis not present

## 2016-08-29 DIAGNOSIS — I442 Atrioventricular block, complete: Secondary | ICD-10-CM

## 2016-08-29 NOTE — Telephone Encounter (Signed)
LMOVM reminding pt to send remote transmission.   

## 2016-08-29 NOTE — Progress Notes (Signed)
Remote pacemaker transmission.   

## 2016-08-31 DIAGNOSIS — Z96651 Presence of right artificial knee joint: Secondary | ICD-10-CM | POA: Diagnosis not present

## 2016-08-31 DIAGNOSIS — M25561 Pain in right knee: Secondary | ICD-10-CM | POA: Diagnosis not present

## 2016-08-31 DIAGNOSIS — R262 Difficulty in walking, not elsewhere classified: Secondary | ICD-10-CM | POA: Diagnosis not present

## 2016-08-31 DIAGNOSIS — M25661 Stiffness of right knee, not elsewhere classified: Secondary | ICD-10-CM | POA: Diagnosis not present

## 2016-09-01 LAB — CUP PACEART REMOTE DEVICE CHECK
Battery Remaining Percentage: 95.5 %
Battery Voltage: 2.99 V
Brady Statistic AP VP Percent: 74 %
Brady Statistic AP VS Percent: 1 %
Brady Statistic AS VP Percent: 26 %
Brady Statistic RA Percent Paced: 74 %
Implantable Lead Implant Date: 20170615
Implantable Lead Implant Date: 20170615
Implantable Lead Location: 753858
Implantable Lead Location: 753859
Lead Channel Impedance Value: 480 Ohm
Lead Channel Impedance Value: 860 Ohm
Lead Channel Pacing Threshold Amplitude: 1.375 V
Lead Channel Pacing Threshold Pulse Width: 0.4 ms
Lead Channel Pacing Threshold Pulse Width: 0.4 ms
Lead Channel Pacing Threshold Pulse Width: 0.4 ms
Lead Channel Sensing Intrinsic Amplitude: 12 mV
Lead Channel Sensing Intrinsic Amplitude: 4.8 mV
Lead Channel Setting Pacing Amplitude: 2 V
Lead Channel Setting Pacing Amplitude: 2.375
Lead Channel Setting Sensing Sensitivity: 5 mV
MDC IDC LEAD IMPLANT DT: 20170615
MDC IDC LEAD LOCATION: 753860
MDC IDC MSMT BATTERY REMAINING LONGEVITY: 79 mo
MDC IDC MSMT LEADCHNL RA IMPEDANCE VALUE: 490 Ohm
MDC IDC MSMT LEADCHNL RA PACING THRESHOLD AMPLITUDE: 0.75 V
MDC IDC MSMT LEADCHNL RV PACING THRESHOLD AMPLITUDE: 0.75 V
MDC IDC PG IMPLANT DT: 20170615
MDC IDC PG SERIAL: 7894586
MDC IDC SESS DTM: 20180605174947
MDC IDC SET LEADCHNL LV PACING PULSEWIDTH: 0.4 ms
MDC IDC SET LEADCHNL RA PACING AMPLITUDE: 2 V
MDC IDC SET LEADCHNL RV PACING PULSEWIDTH: 0.4 ms
MDC IDC STAT BRADY AS VS PERCENT: 1 %

## 2016-09-04 ENCOUNTER — Other Ambulatory Visit: Payer: Self-pay | Admitting: Physician Assistant

## 2016-09-05 ENCOUNTER — Encounter: Payer: Self-pay | Admitting: Cardiology

## 2016-09-05 DIAGNOSIS — M25661 Stiffness of right knee, not elsewhere classified: Secondary | ICD-10-CM | POA: Diagnosis not present

## 2016-09-05 DIAGNOSIS — M25561 Pain in right knee: Secondary | ICD-10-CM | POA: Diagnosis not present

## 2016-09-05 DIAGNOSIS — Z96651 Presence of right artificial knee joint: Secondary | ICD-10-CM | POA: Diagnosis not present

## 2016-09-05 DIAGNOSIS — R262 Difficulty in walking, not elsewhere classified: Secondary | ICD-10-CM | POA: Diagnosis not present

## 2016-09-07 DIAGNOSIS — Z96651 Presence of right artificial knee joint: Secondary | ICD-10-CM | POA: Diagnosis not present

## 2016-09-07 DIAGNOSIS — R262 Difficulty in walking, not elsewhere classified: Secondary | ICD-10-CM | POA: Diagnosis not present

## 2016-09-07 DIAGNOSIS — M25561 Pain in right knee: Secondary | ICD-10-CM | POA: Diagnosis not present

## 2016-09-07 DIAGNOSIS — M25661 Stiffness of right knee, not elsewhere classified: Secondary | ICD-10-CM | POA: Diagnosis not present

## 2016-10-27 DIAGNOSIS — M545 Low back pain: Secondary | ICD-10-CM | POA: Diagnosis not present

## 2016-10-31 ENCOUNTER — Other Ambulatory Visit: Payer: Self-pay | Admitting: Cardiology

## 2016-11-13 ENCOUNTER — Ambulatory Visit: Payer: 59 | Admitting: Physician Assistant

## 2016-11-16 ENCOUNTER — Ambulatory Visit (INDEPENDENT_AMBULATORY_CARE_PROVIDER_SITE_OTHER): Payer: Medicare Other | Admitting: Physician Assistant

## 2016-11-16 ENCOUNTER — Encounter: Payer: Self-pay | Admitting: Physician Assistant

## 2016-11-16 VITALS — BP 110/70 | HR 63 | Temp 97.8°F | Resp 16 | Ht 70.0 in | Wt 272.8 lb

## 2016-11-16 DIAGNOSIS — G4733 Obstructive sleep apnea (adult) (pediatric): Secondary | ICD-10-CM | POA: Diagnosis not present

## 2016-11-16 DIAGNOSIS — Z114 Encounter for screening for human immunodeficiency virus [HIV]: Secondary | ICD-10-CM | POA: Diagnosis not present

## 2016-11-16 DIAGNOSIS — E559 Vitamin D deficiency, unspecified: Secondary | ICD-10-CM | POA: Diagnosis not present

## 2016-11-16 DIAGNOSIS — I442 Atrioventricular block, complete: Secondary | ICD-10-CM | POA: Diagnosis not present

## 2016-11-16 DIAGNOSIS — I519 Heart disease, unspecified: Secondary | ICD-10-CM | POA: Diagnosis not present

## 2016-11-16 DIAGNOSIS — Z950 Presence of cardiac pacemaker: Secondary | ICD-10-CM | POA: Diagnosis not present

## 2016-11-16 DIAGNOSIS — I10 Essential (primary) hypertension: Secondary | ICD-10-CM

## 2016-11-16 DIAGNOSIS — E785 Hyperlipidemia, unspecified: Secondary | ICD-10-CM

## 2016-11-16 DIAGNOSIS — Z1159 Encounter for screening for other viral diseases: Secondary | ICD-10-CM | POA: Diagnosis not present

## 2016-11-16 DIAGNOSIS — Z23 Encounter for immunization: Secondary | ICD-10-CM | POA: Diagnosis not present

## 2016-11-16 LAB — HEPATITIS C ANTIBODY: HCV Ab: NONREACTIVE

## 2016-11-16 NOTE — Progress Notes (Addendum)
Patient ID: TYLER CUBIT MRN: [id number], DOB: December 14, 1950 66 y.o. Date of Encounter: 11/16/2016, 8:52 AM    Chief Complaint: Physical (CPE)  HPI: 66 y.o. y/o male here for CPE.    05/10/2016:  66 y.o. year old male  presents as a New Patient to Establish Care.   He reports that he is seeing Dr. Renaye Rakers at [facility]. Is going to be having right total knee replacement. This has not been scheduled yet. Says they will be sending me a letter for preop evaluation. Says that they would not schedule surgery until he had seen a PCP.  He was going to the [place] urgent care. However has moved and now lives close to our office so wanted to establish PCP here. His last physical was performed there about 2 years ago. Since then he has gone there with just acute illnesses.  States that he has been out of work since December because of his knee. Was working as a Naval architect. Was having to work away from home/long distance driving. Says that he is almost 73 --after he recovers from the knee surgery he may retire or may just start working locally.  He has history of hypertension, obesity, OSA compliant with CPAP, complete heart block, nonischemic cardiomyopathy with pacemaker implant June 2017.  He had cardiac catheterization 09/09/15. This showed nonobstructive CAD. EF 40-45%. They then proceeded with permanent pacemaker implant. Planned for risk factor modification with statin therapy.  He states that these are all of his known chronic medical problems. No other chronic medical problems.   05/11/2016: He presents today for complete physical exam. Ortho will be sending me a note for preop clearance so we need to update preventive care and screening labs so presents for this today. Has no other concerns to address today.    11/16/2016: Today patient states that his right knee surgery went well and that's doing pretty good. Says now he is noticing problems with the left  knee is that he thinks some of it was performed because he had changed the way he was walking etc. because of the right knee. Also because he had decreased his activity prior to the right knee surgery and after the right knee surgery and now is more active than he has been in months. Otherwise he has been feeling well. Has no specific concerns to address today. He is taking blood pressure medications as directed. No lightheadedness or other adverse effects. He is taking cholesterol medication as directed. No myalgias or other adverse effects.  ------11/16/2016: He is now age 66-Give Prevnar 45 today---PREVNAR 72 NOT GIVEN TODAY-------PT WANTS TO CHECK TO SEE IF INSURANCE PAYS FOR IT FIRST----------------------------------------------- -----------------GIVE PNEUMOVAX 23 NEXT OV----6 - 12 months--------------------------------------    Review of Systems: Consitutional: No fever, chills, fatigue, night sweats, lymphadenopathy, or weight changes. Eyes: No visual changes, eye redness, or discharge. ENT/Mouth: Ears: No otalgia, tinnitus, hearing loss, discharge. Nose: No congestion, rhinorrhea, sinus pain, or epistaxis. Throat: No sore throat, post nasal drip, or teeth pain. Cardiovascular: No CP, palpitations, diaphoresis, DOE, edema, orthopnea, PND. Respiratory: No cough, hemoptysis, SOB, or wheezing. Gastrointestinal: No anorexia, dysphagia, reflux, pain, nausea, vomiting, hematemesis, diarrhea, constipation, BRBPR, or melena. Genitourinary: No dysuria, frequency, urgency, hematuria, incontinence, nocturia, decreased urinary stream, discharge, impotence, or testicular pain/masses. Musculoskeletal: Positive for pain in knee Skin: No rash, erythema, lesion changes, pain, warmth, jaundice, or pruritis. Neurological: No headache, dizziness, syncope, seizures, tremors, memory loss, coordination problems, or paresthesias. Psychological: No anxiety,  depression, hallucinations, SI/HI. Endocrine: No  fatigue, polydipsia, polyphagia, polyuria, or known diabetes. All other systems were reviewed and are otherwise negative.  Past Medical History:  Diagnosis Date  . Arthritis   . Complete heart block (HCC)   . Hypertension   . OSA on CPAP   . Presence of permanent cardiac pacemaker      Past Surgical History:  Procedure Laterality Date  . APPENDECTOMY  1960s  . CARDIAC CATHETERIZATION Right 09/08/2015   Procedure: Temporary Pacemaker;  Surgeon: Will Jorja Loa, MD;  Location: [facility] INVASIVE CV LAB;  Service: Cardiovascular;  Laterality: Right;  . CARDIAC CATHETERIZATION N/A 09/09/2015   Procedure: Left Heart Cath and Coronary Angiography;  Surgeon: Peter M Swaziland, MD;  Location: [facility] INVASIVE CV LAB;  Service: Cardiovascular;  Laterality: N/A;  . COLONOSCOPY    . EP IMPLANTABLE DEVICE N/A 09/09/2015   Procedure: BiV Pacemaker Insertion CRT-P;  Surgeon: Will Jorja Loa, MD;  Location: [facility] INVASIVE CV LAB;  Service: Cardiovascular;  Laterality: N/A;  . KNEE SURGERY Right   . TOTAL KNEE ARTHROPLASTY Right 07/11/2016  . TOTAL KNEE ARTHROPLASTY Right 07/11/2016   Procedure: TOTAL KNEE ARTHROPLASTY;  Surgeon: Sheral Apley, MD;  Location: [facility] OR;  Service: Orthopedics;  Laterality: Right;    Home Meds:  Outpatient Medications Prior to Visit  Medication Sig Dispense Refill  . aspirin EC 325 MG tablet Take 1 tablet (325 mg total) by mouth daily. For 30 days post op for DVT Prophylaxis 30 tablet 0  . atorvastatin (LIPITOR) 40 MG tablet TAKE 1 TABLET BY MOUTH EVERY DAY AT 6PM 90 tablet 2  . carvedilol (COREG) 12.5 MG tablet Take 1 tablet (12.5 mg total) by mouth 2 (two) times daily. 180 tablet 3  . cholecalciferol (VITAMIN D) 1000 units tablet Take 1 tablet (1,000 Units total) by mouth daily. 90 tablet 1  . docusate sodium (COLACE) 100 MG capsule Take 1 capsule (100 mg total) by mouth 2 (two) times daily. To prevent constipation while taking pain medication. 60 capsule 0  .  Ibuprofen-Famotidine (DUEXIS) 800-26.6 MG TABS Take 1 tablet by mouth daily.    Marland Kitchen lisinopril (PRINIVIL,ZESTRIL) 40 MG tablet TAKE 1 TABLET BY MOUTH EVERY DAY 90 tablet 2  . methocarbamol (ROBAXIN) 500 MG tablet Take 1 tablet (500 mg total) by mouth every 6 (six) hours as needed for muscle spasms. 40 tablet 0  . omeprazole (PRILOSEC) 20 MG capsule Take 1 capsule (20 mg total) by mouth daily. While taking anti inflammatory medicine daily 30 capsule 2  . ondansetron (ZOFRAN) 4 MG tablet Take 1 tablet (4 mg total) by mouth every 8 (eight) hours as needed for nausea or vomiting. 40 tablet 0  . oxyCODONE-acetaminophen (ROXICET) 5-325 MG tablet Take 1-2 tablets by mouth every 4 (four) hours as needed for severe pain. 60 tablet 0  . Vitamin D, Ergocalciferol, (DRISDOL) 50000 units CAPS capsule Take 1 capsule (50,000 Units total) by mouth every 7 (seven) days. Vit D (Patient taking differently: Take 50,000 Units by mouth every 7 (seven) days. MONDAY) 12 capsule 0   No facility-administered medications prior to visit.     Allergies:  Allergies  Allergen Reactions  . No Known Allergies     Social History   Social History  . Marital status: Married    Spouse name: N/A  . Number of children: N/A  . Years of education: N/A   Occupational History  . truck driver    Social History Main Topics  . Smoking  status: Current Some Day Smoker    Years: 6.00    Types: Pipe  . Smokeless tobacco: Former Neurosurgeon    Quit date: 01/14/2015  . Alcohol use Yes     Comment: occ  . Drug use: No  . Sexual activity: Yes   Other Topics Concern  . Not on file   Social History Narrative   Marital status: Married x 30 years; first marriage     Children: 2 children; no grandchildren     Lives: lives with wife      Employment: truck driver x 14 years; [place]        Tobacco:  None; pipes and cigars; chews tobacco.       Alcohol:  Weekends.      Drugs:  None      Exercise:  Rarely.      Seatbelt:  100%       Guns:  Loaded and secured.       Family History  Problem Relation Age of Onset  . Heart disease Father 41       AMI  . Liver cancer Mother   . Cancer Mother 56       Colon cancer with liver mets  . Heart disease Brother 50       AMI  . Heart disease Maternal Grandmother     Physical Exam: Blood pressure 110/70, pulse 63, temperature 97.8 F (36.6 C), temperature source Oral, resp. rate 16, height 5\' 10"  (1.778 m), weight 272 lb 12.8 oz (123.7 kg), SpO2 97 %.  General: Obese WM. Appears in no acute distress. HEENT: Normocephalic, atraumatic. Conjunctiva pink, sclera non-icteric. Pupils 2 mm constricting to 1 mm, round, regular, and equally reactive to light and accomodation. EOMI. Internal auditory canal clear. TMs with good cone of light and without pathology. Nasal mucosa pink. Nares are without discharge. No sinus tenderness. Oral mucosa pink. Pharynx without exudate.   Neck: Supple. Trachea midline. No thyromegaly. Full ROM. No lymphadenopathy.No carotid bruits. Lungs: Clear to auscultation bilaterally without wheezes, rales, or rhonchi. Breathing is of normal effort and unlabored. Cardiovascular: RRR with S1 S2. No murmurs, rubs, or gallops. Distal pulses 2+ symmetrically. No carotid or abdominal bruits. Abdomen: Soft, non-tender, non-distended with normoactive bowel sounds. No hepatosplenomegaly or masses. No rebound/guarding. No CVA tenderness. No hernias. Rectal: No external hemorrhoids or fissures. Rectal vault without masses. Prostate gland firm and smooth. No nodularity, tenderness, mass, or induration.  Musculoskeletal: He has decreased ROM of knee.  Skin: Warm and moist without erythema, ecchymosis, wounds, or rash. Neuro: A+Ox3. CN II-XII grossly intact. Moves all extremities spontaneously. Full sensation throughout. Gait is abnormal secondary to knee. Using crutches. Therefore, DTRs not checked Psych:  Responds to questions appropriately with a normal affect.    Assessment/Plan:  66 y.o. y/o white male here for      ------11/16/2016: He is now age 54-Give Prevnar 48 today---PREVNAR 78 NOT GIVEN TODAY-------PT WANTS TO CHECK TO SEE IF INSURANCE PAYS FOR IT FIRST----------------------------------------------- -----------------GIVE PNEUMOVAX 23 NEXT OV----6 - 12 months--------------------------------------  Essential hypertension 11/16/2016: Blood Pressure is at goal/well controlled. Continue current medication.  Hyperlipidemia, unspecified hyperlipidemia type 11/16/2016: On Lipitor 40 mg. Recheck lab to monitor. - COMPLETE METABOLIC PANEL WITH GFR - Lipid panel  Vitamin D deficiency At lab 05/11/16 vitamin D level was low at 22. At that time recommended to start 1000 units daily. 11/16/2016 --- he reports that he took 5000 units once a week for 1 month but has taken no  vitamin D supplement after that. Today he reports that he will start taking 1000 units daily. I will wait to recheck vitamin D level after he has been on supplement routinely.  Need for hepatitis C screening test - Hepatitis C antibody  Screening for HIV (human immunodeficiency virus) - HIV antibody   Obstructive sleep apnea 11/16/2016: He is compliant with using CPAP.  Complete heart block (HCC) 11/16/2016: S/P Pacemaker implant. Managed by Cardiology.  Presence of permanent cardiac pacemaker 11/16/2016: S/P Pacemaker implant. Managed by Cardiology.  Left ventricular dysfunction 11/16/2016: Managed by cardiology. He is on Coreg and ACE inhibitor.     THE FOLLOWING IS COPIED FROM HIS CPE NOTE 05/11/2016: Encounter for preventive health examination  A. Screening Labs: - CBC with Differential/Platelet - COMPLETE METABOLIC PANEL WITH GFR - Lipid panel - TSH - VITAMIN D 25 Hydroxy (Vit-D Deficiency, Fractures)  B. Screening For Prostate Cancer: - PSA  C. Screening For Colorectal Cancer:  He reports that he had colonoscopy fall 2017. States that showed polyps  and was told to repeat 5 years.  D. Immunizations: Flu------------------ did receive flu vaccine for flu season 2017-2018 Tetanus-----he has not had tetanus in greater than 10 years. Agreeable to update this today. T dap given here 05/11/16. Pneumococcal-----no indication to require a pneumonia vaccine until age 59. Will start this next year. ------11/16/2016: He is now age 50-Give Prevnar 35 today---PREVNAR 67 NOT GIVEN TODAY-------PT WANTS TO CHECK TO SEE IF INSURANCE PAYS FOR IT FIRST----------------------------------------------- -----------------GIVE PNEUMOVAX 23 NEXT OV----6 - 12 months-------------------------------------- Shingrix---------- he has not had Shingrix. Told him to do it checked cost with insurance and then let us know.      Plan routine follow-up office visit 6 months or sooner if needed.    Signed:   [address] [place], [place]  11/16/2016 8:52 AM

## 2016-11-16 NOTE — Addendum Note (Signed)
Addended by: Phineas Semen A on: 11/16/2016 05:20 PM   Modules accepted: Orders

## 2016-11-17 LAB — COMPLETE METABOLIC PANEL WITH GFR
ALBUMIN: 4.2 g/dL (ref 3.6–5.1)
ALK PHOS: 76 U/L (ref 40–115)
ALT: 13 U/L (ref 9–46)
AST: 15 U/L (ref 10–35)
BILIRUBIN TOTAL: 1.1 mg/dL (ref 0.2–1.2)
BUN: 11 mg/dL (ref 7–25)
CO2: 19 mmol/L — AB (ref 20–32)
CREATININE: 0.95 mg/dL (ref 0.70–1.25)
Calcium: 9.3 mg/dL (ref 8.6–10.3)
Chloride: 107 mmol/L (ref 98–110)
GFR, Est Non African American: 84 mL/min (ref >=60)
GLUCOSE: 108 mg/dL — AB (ref 70–99)
Potassium: 4.5 mmol/L (ref 3.5–5.3)
SODIUM: 140 mmol/L (ref 135–146)
TOTAL PROTEIN: 6.6 g/dL (ref 6.1–8.1)

## 2016-11-17 LAB — LIPID PANEL
Cholesterol: 140 mg/dL (ref <200)
HDL: 47 mg/dL (ref >40)
LDL CALC: 75 mg/dL (ref <100)
Total CHOL/HDL Ratio: 3 Ratio (ref <5.0)
Triglycerides: 89 mg/dL (ref <150)
VLDL: 18 mg/dL (ref <30)

## 2016-11-17 LAB — HIV ANTIBODY (ROUTINE TESTING W REFLEX): HIV 1&2 Ab, 4th Generation: NONREACTIVE

## 2016-11-20 ENCOUNTER — Encounter: Payer: Self-pay | Admitting: Physician Assistant

## 2016-11-28 ENCOUNTER — Ambulatory Visit (INDEPENDENT_AMBULATORY_CARE_PROVIDER_SITE_OTHER): Payer: Medicare Other | Admitting: *Deleted

## 2016-11-28 DIAGNOSIS — I442 Atrioventricular block, complete: Secondary | ICD-10-CM

## 2016-11-30 NOTE — Progress Notes (Signed)
Remote pacemaker transmission.   

## 2016-12-06 ENCOUNTER — Encounter: Payer: Self-pay | Admitting: Cardiology

## 2016-12-12 LAB — CUP PACEART REMOTE DEVICE CHECK
Battery Remaining Longevity: 80 mo
Battery Voltage: 2.98 V
Brady Statistic AP VP Percent: 77 %
Brady Statistic RA Percent Paced: 77 %
Implantable Lead Implant Date: 20170615
Implantable Lead Implant Date: 20170615
Implantable Lead Location: 753858
Implantable Lead Location: 753859
Implantable Pulse Generator Implant Date: 20170615
Lead Channel Impedance Value: 1025 Ohm
Lead Channel Impedance Value: 480 Ohm
Lead Channel Impedance Value: 510 Ohm
Lead Channel Pacing Threshold Amplitude: 0.75 V
Lead Channel Pacing Threshold Amplitude: 0.875 V
Lead Channel Pacing Threshold Amplitude: 1.625 V
Lead Channel Pacing Threshold Pulse Width: 0.4 ms
Lead Channel Sensing Intrinsic Amplitude: 12 mV
Lead Channel Setting Pacing Amplitude: 2.625
Lead Channel Setting Pacing Pulse Width: 0.4 ms
Lead Channel Setting Pacing Pulse Width: 0.4 ms
Lead Channel Setting Sensing Sensitivity: 5 mV
MDC IDC LEAD IMPLANT DT: 20170615
MDC IDC LEAD LOCATION: 753860
MDC IDC MSMT BATTERY REMAINING PERCENTAGE: 95.5 %
MDC IDC MSMT LEADCHNL LV PACING THRESHOLD PULSEWIDTH: 0.4 ms
MDC IDC MSMT LEADCHNL RA SENSING INTR AMPL: 5 mV
MDC IDC MSMT LEADCHNL RV PACING THRESHOLD PULSEWIDTH: 0.4 ms
MDC IDC SESS DTM: 20180906013029
MDC IDC SET LEADCHNL RA PACING AMPLITUDE: 2 V
MDC IDC SET LEADCHNL RV PACING AMPLITUDE: 2 V
MDC IDC STAT BRADY AP VS PERCENT: 1 %
MDC IDC STAT BRADY AS VP PERCENT: 23 %
MDC IDC STAT BRADY AS VS PERCENT: 1 %
Pulse Gen Serial Number: 7894586

## 2017-02-27 ENCOUNTER — Telehealth: Payer: Self-pay | Admitting: Cardiology

## 2017-02-27 ENCOUNTER — Ambulatory Visit (INDEPENDENT_AMBULATORY_CARE_PROVIDER_SITE_OTHER): Payer: Medicare Other | Admitting: *Deleted

## 2017-02-27 DIAGNOSIS — I442 Atrioventricular block, complete: Secondary | ICD-10-CM | POA: Diagnosis not present

## 2017-02-27 NOTE — Telephone Encounter (Signed)
Spoke with pt and reminded pt of remote transmission that is due today. Pt verbalized understanding.   

## 2017-02-28 ENCOUNTER — Encounter: Payer: Self-pay | Admitting: Cardiology

## 2017-02-28 LAB — CUP PACEART REMOTE DEVICE CHECK
Battery Remaining Percentage: 95.5 %
Brady Statistic AP VP Percent: 76 %
Brady Statistic RA Percent Paced: 76 %
Date Time Interrogation Session: 20181204214443
Implantable Lead Implant Date: 20170615
Implantable Lead Implant Date: 20170615
Implantable Lead Location: 753859
Lead Channel Pacing Threshold Amplitude: 0.75 V
Lead Channel Pacing Threshold Pulse Width: 0.4 ms
Lead Channel Sensing Intrinsic Amplitude: 5 mV
Lead Channel Setting Pacing Amplitude: 2 V
Lead Channel Setting Pacing Amplitude: 2 V
Lead Channel Setting Pacing Amplitude: 3.125
Lead Channel Setting Sensing Sensitivity: 5 mV
MDC IDC LEAD IMPLANT DT: 20170615
MDC IDC LEAD LOCATION: 753858
MDC IDC LEAD LOCATION: 753860
MDC IDC MSMT BATTERY REMAINING LONGEVITY: 78 mo
MDC IDC MSMT BATTERY VOLTAGE: 2.98 V
MDC IDC MSMT LEADCHNL LV IMPEDANCE VALUE: 960 Ohm
MDC IDC MSMT LEADCHNL LV PACING THRESHOLD AMPLITUDE: 2.125 V
MDC IDC MSMT LEADCHNL LV PACING THRESHOLD PULSEWIDTH: 0.4 ms
MDC IDC MSMT LEADCHNL RA IMPEDANCE VALUE: 490 Ohm
MDC IDC MSMT LEADCHNL RV IMPEDANCE VALUE: 480 Ohm
MDC IDC MSMT LEADCHNL RV PACING THRESHOLD AMPLITUDE: 0.875 V
MDC IDC MSMT LEADCHNL RV PACING THRESHOLD PULSEWIDTH: 0.4 ms
MDC IDC MSMT LEADCHNL RV SENSING INTR AMPL: 12 mV
MDC IDC PG IMPLANT DT: 20170615
MDC IDC SET LEADCHNL LV PACING PULSEWIDTH: 0.4 ms
MDC IDC SET LEADCHNL RV PACING PULSEWIDTH: 0.4 ms
MDC IDC STAT BRADY AP VS PERCENT: 1 %
MDC IDC STAT BRADY AS VP PERCENT: 24 %
MDC IDC STAT BRADY AS VS PERCENT: 1 %
Pulse Gen Serial Number: 7894586

## 2017-02-28 NOTE — Progress Notes (Signed)
Remote pacemaker transmission.   

## 2017-04-23 ENCOUNTER — Other Ambulatory Visit: Payer: Self-pay

## 2017-04-23 ENCOUNTER — Ambulatory Visit (INDEPENDENT_AMBULATORY_CARE_PROVIDER_SITE_OTHER): Payer: Medicare Other | Admitting: Physician Assistant

## 2017-04-23 ENCOUNTER — Encounter: Payer: Self-pay | Admitting: Physician Assistant

## 2017-04-23 VITALS — BP 142/92 | HR 63 | Temp 97.5°F | Resp 16 | Wt 295.4 lb

## 2017-04-23 DIAGNOSIS — H9311 Tinnitus, right ear: Secondary | ICD-10-CM | POA: Diagnosis not present

## 2017-04-23 MED ORDER — CETIRIZINE HCL 10 MG PO CAPS: ORAL_CAPSULE | ORAL | 0 refills | Status: DC

## 2017-04-23 NOTE — Progress Notes (Signed)
Patient ID: Harold Robertson MRN: [id number], DOB: 01/09/1951, 67 y.o. Date of Encounter: 04/23/2017, 4:48 PM    Chief Complaint:  Chief Complaint  Patient presents with  . ringing in right ear     HPI: 67 y.o. year old male presents with above.   He reports that he has hearing aids for both ears-- but he does not have them with him here at this visit. States that recently over the past week when he puts in his hearing aid it seems like the one for the right ear is not working at all.  States that when he puts the one in his left ear--- when he first puts it in--- it makes a little sound but he has not even been hearing that sound when he puts in hearing aid in the right ear.  He is also hearing a ringing in his ear in the right ear.  He thought that little plug / piece from the hearing aid may have gotten stuck down in his ear or may have wax blocked up in his ear or something so wanted to get it checked.  Has had very minimal nasal congestion, very minimal drainage from his nose.  No sore throat.  No fevers or chills.  No vertigo.     Home Meds:   Outpatient Medications Prior to Visit  Medication Sig Dispense Refill  . aspirin EC 325 MG tablet Take 1 tablet (325 mg total) by mouth daily. For 30 days post op for DVT Prophylaxis (Patient taking differently: Take 325 mg by mouth daily. For 30 days post op for DVT Prophylaxis) 30 tablet 0  . atorvastatin (LIPITOR) 40 MG tablet TAKE 1 TABLET BY MOUTH EVERY DAY AT 6PM 90 tablet 2  . carvedilol (COREG) 12.5 MG tablet Take 1 tablet (12.5 mg total) by mouth 2 (two) times daily. 180 tablet 3  . cholecalciferol (VITAMIN D) 1000 units tablet Take 1 tablet (1,000 Units total) by mouth daily. 90 tablet 1  . lisinopril (PRINIVIL,ZESTRIL) 40 MG tablet TAKE 1 TABLET BY MOUTH EVERY DAY 90 tablet 2   No facility-administered medications prior to visit.     Allergies:  Allergies  Allergen Reactions  . No Known Allergies       Review of  Systems: See HPI for pertinent ROS. All other ROS negative.    Physical Exam: Blood pressure (!) 142/92, pulse 63, temperature (!) 97.5 F (36.4 C), temperature source Oral, resp. rate 16, weight 134 kg (295 lb 6.4 oz), SpO2 99 %., Body mass index is 42.39 kg/m. General:  WM. Appears in no acute distress. HEENT: Normocephalic, atraumatic, eyes without discharge, sclera non-icteric, nares are without discharge. Bilateral auditory canals clear, TM's are without perforation, pearly grey and translucent with reflective cone of light bilaterally. Neck: Supple. No thyromegaly. No lymphadenopathy. Lungs: Clear bilaterally to auscultation without wheezes, rales, or rhonchi. Breathing is unlabored. Heart: Regular rhythm. No murmurs, rubs, or gallops. Msk:  Strength and tone normal for age. Extremities/Skin: Warm and dry.  Neuro: Alert and oriented X 3. Moves all extremities spontaneously. Gait is normal. CNII-XII grossly in tact. Psych:  Responds to questions appropriately with a normal affect.     ASSESSMENT AND PLAN:  67 y.o. year old male with  1. Tinnitus of right ear Reassured him that ear exam appears normal. Recommend that he go home and further evaluate his hearing aids to see if both hearing aids are functioning. Also recommend that he take Zyrtec once daily  in case there is any drainage behind his ear causing symptoms. If symptoms persist after doing these then will follow up with ENT.  He wants me to go ahead and put in the ENT referral today and he will cancel that if symptoms resolve with above treatment. I considered doing audiometry today but did not think this was going to change the course of anything since we know this is already abnormal as he does have hearing aids at home. He does not remember who he saw for evaluation prior to getting his hearing aids.  States that he got them many years ago.  - Cetirizine HCl (ZYRTEC ALLERGY) 10 MG CAPS; Take 1 daily for congestion  symptoms.  Dispense: 30 capsule; Refill: 0 - Ambulatory referral to ENT    Signed, [facility] [place], [place], [facility] 04/23/2017 4:48 PM

## 2017-05-01 DIAGNOSIS — H903 Sensorineural hearing loss, bilateral: Secondary | ICD-10-CM | POA: Insufficient documentation

## 2017-05-25 ENCOUNTER — Other Ambulatory Visit: Payer: Self-pay | Admitting: Physician Assistant

## 2017-05-25 DIAGNOSIS — H9311 Tinnitus, right ear: Secondary | ICD-10-CM

## 2017-05-29 ENCOUNTER — Ambulatory Visit (INDEPENDENT_AMBULATORY_CARE_PROVIDER_SITE_OTHER): Payer: Medicare Other | Admitting: *Deleted

## 2017-05-29 DIAGNOSIS — I442 Atrioventricular block, complete: Secondary | ICD-10-CM | POA: Diagnosis not present

## 2017-05-29 LAB — CUP PACEART REMOTE DEVICE CHECK
Date Time Interrogation Session: 20190305162555
Implantable Lead Implant Date: 20170615
Implantable Lead Location: 753860
Implantable Pulse Generator Implant Date: 20170615
MDC IDC LEAD IMPLANT DT: 20170615
MDC IDC LEAD IMPLANT DT: 20170615
MDC IDC LEAD LOCATION: 753858
MDC IDC LEAD LOCATION: 753859
Pulse Gen Model: 3262
Pulse Gen Serial Number: 7894586

## 2017-05-29 NOTE — Progress Notes (Signed)
Remote pacemaker transmission.   

## 2017-05-30 ENCOUNTER — Encounter: Payer: Self-pay | Admitting: Cardiology

## 2017-05-30 NOTE — Progress Notes (Signed)
Electrophysiology Office Note   Date:  06/01/2017   ID:  CLAYBURN GARBETT, DOB 1950-06-04, MRN [id number]  PCP:  Dorena Bodo, PA-C  Cardiologist:  Duke Salvia Primary Electrophysiologist:  Lyndee Herbst Jorja Loa, MD    Chief Complaint  Patient presents with  . Pacemaker Check    Complete heart block     History of Present Illness: Harold Robertson is a 67 y.o. male who presents today for electrophysiology evaluation.   PMHx of HTN, obesity, OSA compliant with CPAP. Was admitted to the hospital with complete AV block. Had St. Jude CRT-P implanted 09/09/15. EF at the time was 40-45%. LHC showed no evidence for obstructive CAD. Currently feeling well without any major issues. He is planning a right knee replacement for significant arthritis. No date has yet been scheduled.  Today, denies symptoms of palpitations, chest pain, shortness of breath, orthopnea, PND, lower extremity edema, claudication, dizziness, presyncope, syncope, bleeding, or neurologic sequela. The patient is tolerating medications without difficulties.     Past Medical History:  Diagnosis Date  . Arthritis   . Complete heart block (HCC)   . Hypertension   . OSA on CPAP   . Presence of permanent cardiac pacemaker    Past Surgical History:  Procedure Laterality Date  . APPENDECTOMY  1960s  . CARDIAC CATHETERIZATION Right 09/08/2015   Procedure: Temporary Pacemaker;  Surgeon: Jahad Old Jorja Loa, MD;  Location: [facility] INVASIVE CV LAB;  Service: Cardiovascular;  Laterality: Right;  . CARDIAC CATHETERIZATION N/A 09/09/2015   Procedure: Left Heart Cath and Coronary Angiography;  Surgeon: Peter M Swaziland, MD;  Location: [facility] INVASIVE CV LAB;  Service: Cardiovascular;  Laterality: N/A;  . COLONOSCOPY    . EP IMPLANTABLE DEVICE N/A 09/09/2015   Procedure: BiV Pacemaker Insertion CRT-P;  Surgeon: Rose Hegner Jorja Loa, MD;  Location: [facility] INVASIVE CV LAB;  Service: Cardiovascular;  Laterality: N/A;  . KNEE SURGERY Right   . TOTAL KNEE  ARTHROPLASTY Right 07/11/2016  . TOTAL KNEE ARTHROPLASTY Right 07/11/2016   Procedure: TOTAL KNEE ARTHROPLASTY;  Surgeon: Sheral Apley, MD;  Location: [facility] OR;  Service: Orthopedics;  Laterality: Right;     Current Outpatient Medications  Medication Sig Dispense Refill  . aspirin 81 MG tablet Take 81 mg by mouth daily.    Marland Kitchen atorvastatin (LIPITOR) 40 MG tablet TAKE 1 TABLET BY MOUTH EVERY DAY AT 6PM 90 tablet 2  . carvedilol (COREG) 12.5 MG tablet Take 1 tablet (12.5 mg total) by mouth 2 (two) times daily. 180 tablet 3  . cetirizine (ZYRTEC) 10 MG tablet TAKE 1 TABLET BY MOUTH EVERY DAY 30 tablet 0  . cholecalciferol (VITAMIN D) 1000 units tablet Take 1 tablet (1,000 Units total) by mouth daily. 90 tablet 1  . lisinopril (PRINIVIL,ZESTRIL) 40 MG tablet TAKE 1 TABLET BY MOUTH EVERY DAY 90 tablet 2   No current facility-administered medications for this visit.     Allergies:   No known allergies   Social History:  The patient  reports that he has been smoking pipe.  He has smoked for the past 6.00 years. He quit smokeless tobacco use about 2 years ago. He reports that he drinks alcohol. He reports that he does not use drugs.   Family History:  The patient's family history includes Cancer (age of onset: 46) in his mother; Heart disease in his maternal grandmother; Heart disease (age of onset: 18) in his brother; Heart disease (age of onset: 71) in his father; Liver cancer in his mother.  ROS:  Please see the history of present illness.   Otherwise, review of systems is positive for none.   All other systems are reviewed and negative.   PHYSICAL EXAM: VS:  BP 128/72   Pulse 72   Ht 5\' 11"  (1.803 m)   Wt 289 lb 9.6 oz (131.4 kg)   BMI 40.39 kg/m  , BMI Body mass index is 40.39 kg/m. GEN: Well nourished, well developed, in no acute distress  HEENT: normal  Neck: no JVD, carotid bruits, or masses Cardiac: RRR; no murmurs, rubs, or gallops,no edema  Respiratory:  clear to  auscultation bilaterally, normal work of breathing GI: soft, nontender, nondistended, + BS MS: no deformity or atrophy  Skin: warm and dry, device site well healed Neuro:  Strength and sensation are intact Psych: euthymic mood, full affect  EKG:  EKG is ordered today. Personal review of the ekg ordered shows intermittent A pacing, V paced  Personal review of the device interrogation today. Results in Paceart    Recent Labs: 06/30/2016: Hemoglobin 14.0; Platelets 160 11/16/2016: ALT 13; BUN 11; Creat 0.95; Potassium 4.5; Sodium 140    Lipid Panel     Component Value Date/Time   CHOL 140 11/16/2016 0950   TRIG 89 11/16/2016 0950   HDL 47 11/16/2016 0950   CHOLHDL 3.0 11/16/2016 0950   VLDL 18 11/16/2016 0950   LDLCALC 75 11/16/2016 0950     Wt Readings from Last 3 Encounters:  06/01/17 289 lb 9.6 oz (131.4 kg)  04/23/17 295 lb 6.4 oz (134 kg)  11/16/16 272 lb 12.8 oz (123.7 kg)      Other studies Reviewed: Additional studies/ records that were reviewed today include: Cardiac cath 09/09/15, TTE 09/08/15  Review of the above records today demonstrates:   Prox RCA lesion, 20% stenosed.  Prox LAD to Mid LAD lesion, 40% stenosed.  Mid Cx lesion, 20% stenosed.  Ost 2nd Mrg to 2nd Mrg lesion, 20% stenosed.  1st Diag lesion, 30% stenosed.  There is mild to moderate left ventricular systolic dysfunction.   1. Nonobstructive CAD 2. Mild to moderate LV dysfunction. EF 40-45%.  - Left ventricle: The cavity size was normal. There was mild   concentric hypertrophy. Systolic function was mildly to   moderately reduced. The estimated ejection fraction was in the   range of 40% to 45%. Diffuse hypokinesis. - Mitral valve: There was mild to moderate regurgitation. - Left atrium: The atrium was severely dilated. - Right ventricle: The cavity size was mildly dilated. Wall   thickness was normal. Systolic function was moderately reduced. - Right atrium: The atrium was severely  dilated.  ASSESSMENT AND PLAN:  1.  Complete AV block: Post Saint Jude CRT P.  Functioning appropriately.  No changes.  2. nonobstructive CAD: Current chest pain.  Continue current management.  3. Hypertension: Well-controlled today.  No changes.  4. Hyperlipidemia: Continue atorvastatin   Current medicines are reviewed at length with the patient today.   The patient does not have concerns regarding his medicines.  The following changes were made today:  none  Labs/ tests ordered today include:  Orders Placed This Encounter  Procedures  . EKG 12-Lead     Disposition:   FU with Velmer Woelfel 1 year  Signed, Tarris Delbene Jorja Loa, MD  06/01/2017 11:16 AM     [facility] [address] [place] [address] [phone] (office) [phone] (fax)

## 2017-06-01 ENCOUNTER — Encounter: Payer: Self-pay | Admitting: Cardiology

## 2017-06-01 ENCOUNTER — Ambulatory Visit (INDEPENDENT_AMBULATORY_CARE_PROVIDER_SITE_OTHER): Payer: Medicare Other | Admitting: Cardiology

## 2017-06-01 VITALS — BP 128/72 | HR 72 | Ht 71.0 in | Wt 289.6 lb

## 2017-06-01 DIAGNOSIS — I10 Essential (primary) hypertension: Secondary | ICD-10-CM

## 2017-06-01 DIAGNOSIS — I2510 Atherosclerotic heart disease of native coronary artery without angina pectoris: Secondary | ICD-10-CM

## 2017-06-01 DIAGNOSIS — E785 Hyperlipidemia, unspecified: Secondary | ICD-10-CM

## 2017-06-01 DIAGNOSIS — I442 Atrioventricular block, complete: Secondary | ICD-10-CM | POA: Diagnosis not present

## 2017-06-01 LAB — CUP PACEART INCLINIC DEVICE CHECK
Battery Voltage: 2.98 V
Brady Statistic RA Percent Paced: 78 %
Date Time Interrogation Session: 20190308161457
Implantable Lead Implant Date: 20170615
Implantable Lead Implant Date: 20170615
Implantable Lead Implant Date: 20170615
Implantable Lead Location: 753858
Implantable Lead Location: 753859
Implantable Lead Location: 753860
Lead Channel Impedance Value: 1012.5 Ohm
Lead Channel Impedance Value: 462.5 Ohm
Lead Channel Impedance Value: 525 Ohm
Lead Channel Pacing Threshold Amplitude: 0.75 V
Lead Channel Pacing Threshold Amplitude: 0.75 V
Lead Channel Pacing Threshold Amplitude: 1 V
Lead Channel Pacing Threshold Amplitude: 1 V
Lead Channel Pacing Threshold Amplitude: 2 V
Lead Channel Pacing Threshold Pulse Width: 0.4 ms
Lead Channel Pacing Threshold Pulse Width: 0.4 ms
Lead Channel Pacing Threshold Pulse Width: 0.4 ms
Lead Channel Pacing Threshold Pulse Width: 0.4 ms
Lead Channel Setting Pacing Amplitude: 2 V
Lead Channel Setting Pacing Amplitude: 2 V
Lead Channel Setting Sensing Sensitivity: 5 mV
MDC IDC MSMT BATTERY REMAINING LONGEVITY: 74 mo
MDC IDC MSMT LEADCHNL LV PACING THRESHOLD AMPLITUDE: 2 V
MDC IDC MSMT LEADCHNL RA PACING THRESHOLD PULSEWIDTH: 0.4 ms
MDC IDC MSMT LEADCHNL RA SENSING INTR AMPL: 4.7 mV
MDC IDC MSMT LEADCHNL RV PACING THRESHOLD PULSEWIDTH: 0.4 ms
MDC IDC MSMT LEADCHNL RV SENSING INTR AMPL: 12 mV
MDC IDC PG IMPLANT DT: 20170615
MDC IDC PG SERIAL: 7894586
MDC IDC SET LEADCHNL LV PACING AMPLITUDE: 2.875
MDC IDC SET LEADCHNL LV PACING PULSEWIDTH: 0.4 ms
MDC IDC SET LEADCHNL RV PACING PULSEWIDTH: 0.4 ms
MDC IDC STAT BRADY RV PERCENT PACED: 99.81 %

## 2017-06-01 NOTE — Patient Instructions (Signed)
Medication Instructions:  Your physician recommends that you continue on your current medications as directed. Please refer to the Current Medication list given to you today.  *If you need a refill on your cardiac medications before your next appointment, please call your pharmacy*  Labwork: None ordered  Testing/Procedures: None ordered  Follow-Up: Remote monitoring is used to monitor your Pacemaker or ICD from home. This monitoring reduces the number of office visits required to check your device to one time per year. It allows [place] to keep an eye on the functioning of your device to ensure it is working properly. You are scheduled for a device check from home on 08/28/2017. You may send your transmission at any time that day. If you have a wireless device, the transmission will be sent automatically. After your physician reviews your transmission, you will receive a postcard with your next transmission date.  Your physician wants you to follow-up in: 1 year with Dr. Elberta Fortis.  You will receive a reminder letter in the mail two months in advance. If you don't receive a letter, please call our office to schedule the follow-up appointment.  Thank you for choosing [facility]!!   Dory Horn, RN [phone]

## 2017-07-24 ENCOUNTER — Other Ambulatory Visit: Payer: Self-pay | Admitting: Cardiology

## 2017-08-28 ENCOUNTER — Ambulatory Visit (INDEPENDENT_AMBULATORY_CARE_PROVIDER_SITE_OTHER): Payer: Medicare Other | Admitting: *Deleted

## 2017-08-28 ENCOUNTER — Telehealth: Payer: Self-pay | Admitting: Cardiology

## 2017-08-28 DIAGNOSIS — I442 Atrioventricular block, complete: Secondary | ICD-10-CM

## 2017-08-28 NOTE — Telephone Encounter (Signed)
LMOVM reminding pt to send remote transmission.   

## 2017-08-28 NOTE — Telephone Encounter (Signed)
Spoke w/ pt and informed him that his remote transmission was received. Informed pt that in the future he can assume that the transmission is automatic and if it is not someone from the clinic will call him. Pt verbalized understanding.

## 2017-08-28 NOTE — Telephone Encounter (Signed)
New Message     1. Has your device fired? no  2. Is you device beeping? no  3. Are you experiencing draining or swelling at device site? no 4. Are you calling to see if we received your device transmission? Returning your call, is it suppose to remotely go or does he have to send it   5. Have you passed out? No     Please route to Device Clinic Pool

## 2017-08-29 ENCOUNTER — Encounter: Payer: Self-pay | Admitting: Cardiology

## 2017-08-29 NOTE — Progress Notes (Signed)
Remote pacemaker transmission.   

## 2017-09-10 IMAGING — DX DG KNEE 1-2V PORT*R*
2 series · 2 of 2 positions shown · non-contrast
Comparison: 03/01/2016

CLINICAL DATA: Postop right knee replacement

EXAM:
PORTABLE RIGHT KNEE - 1-2 VIEW

[knee ap]
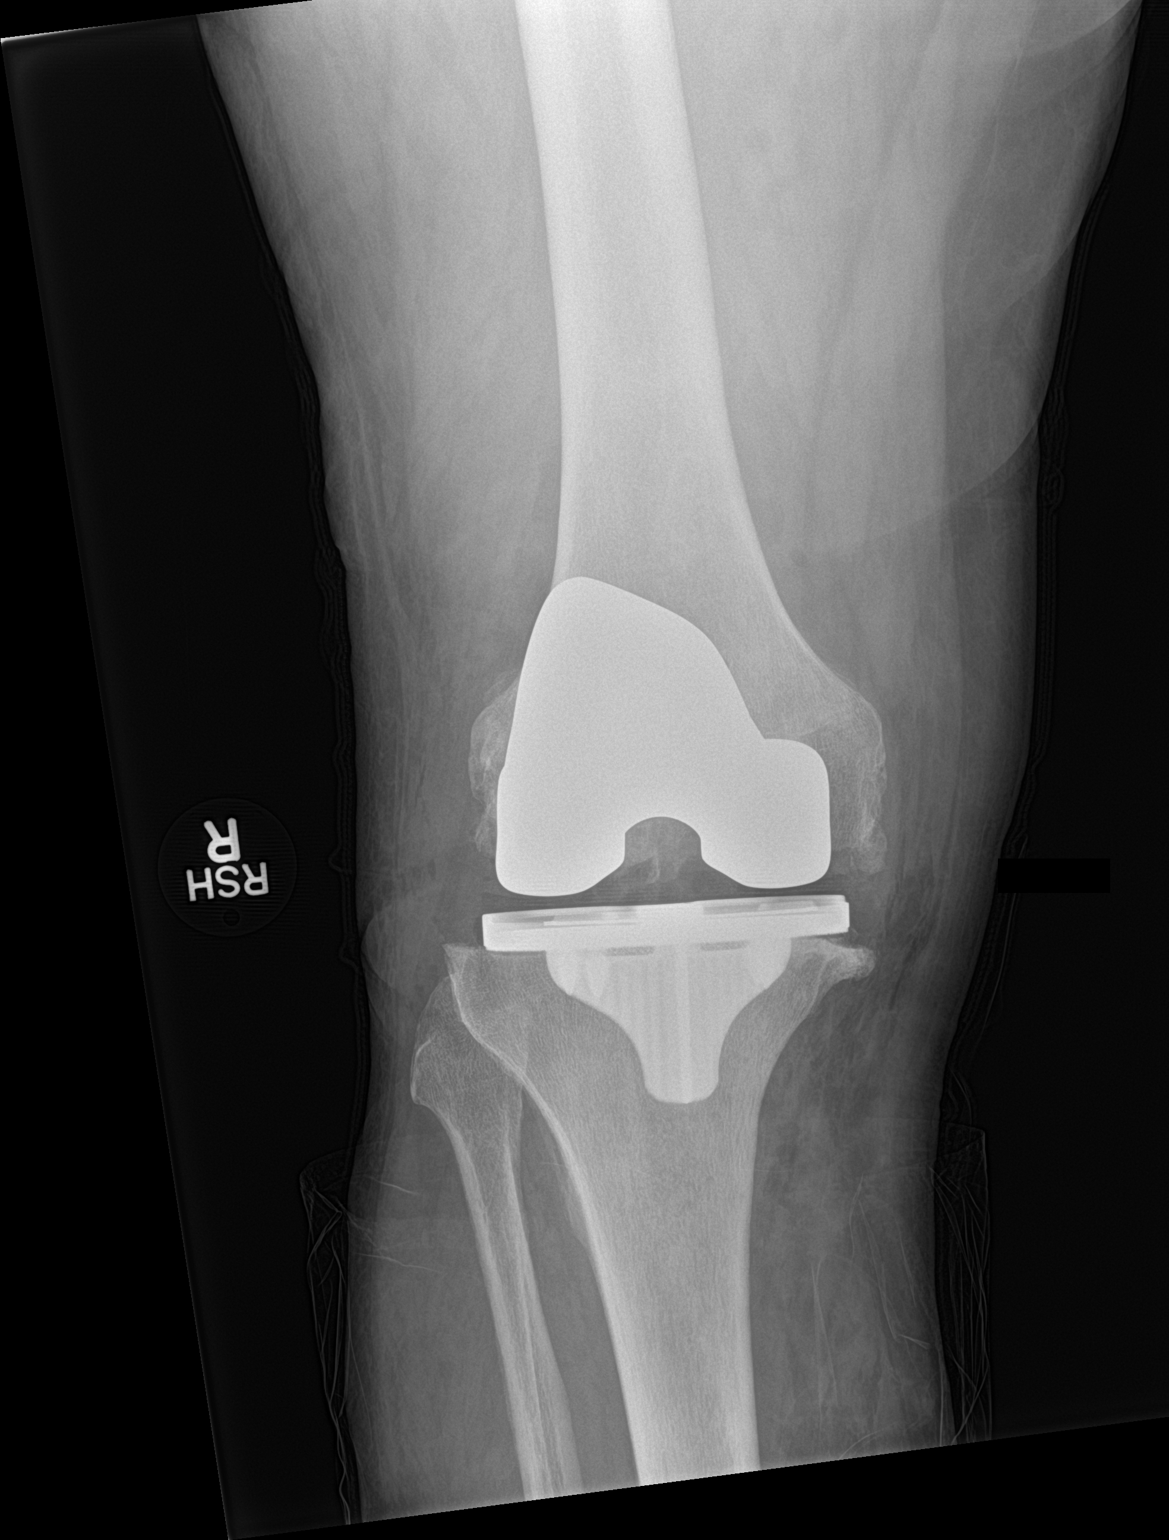

[knee lat]
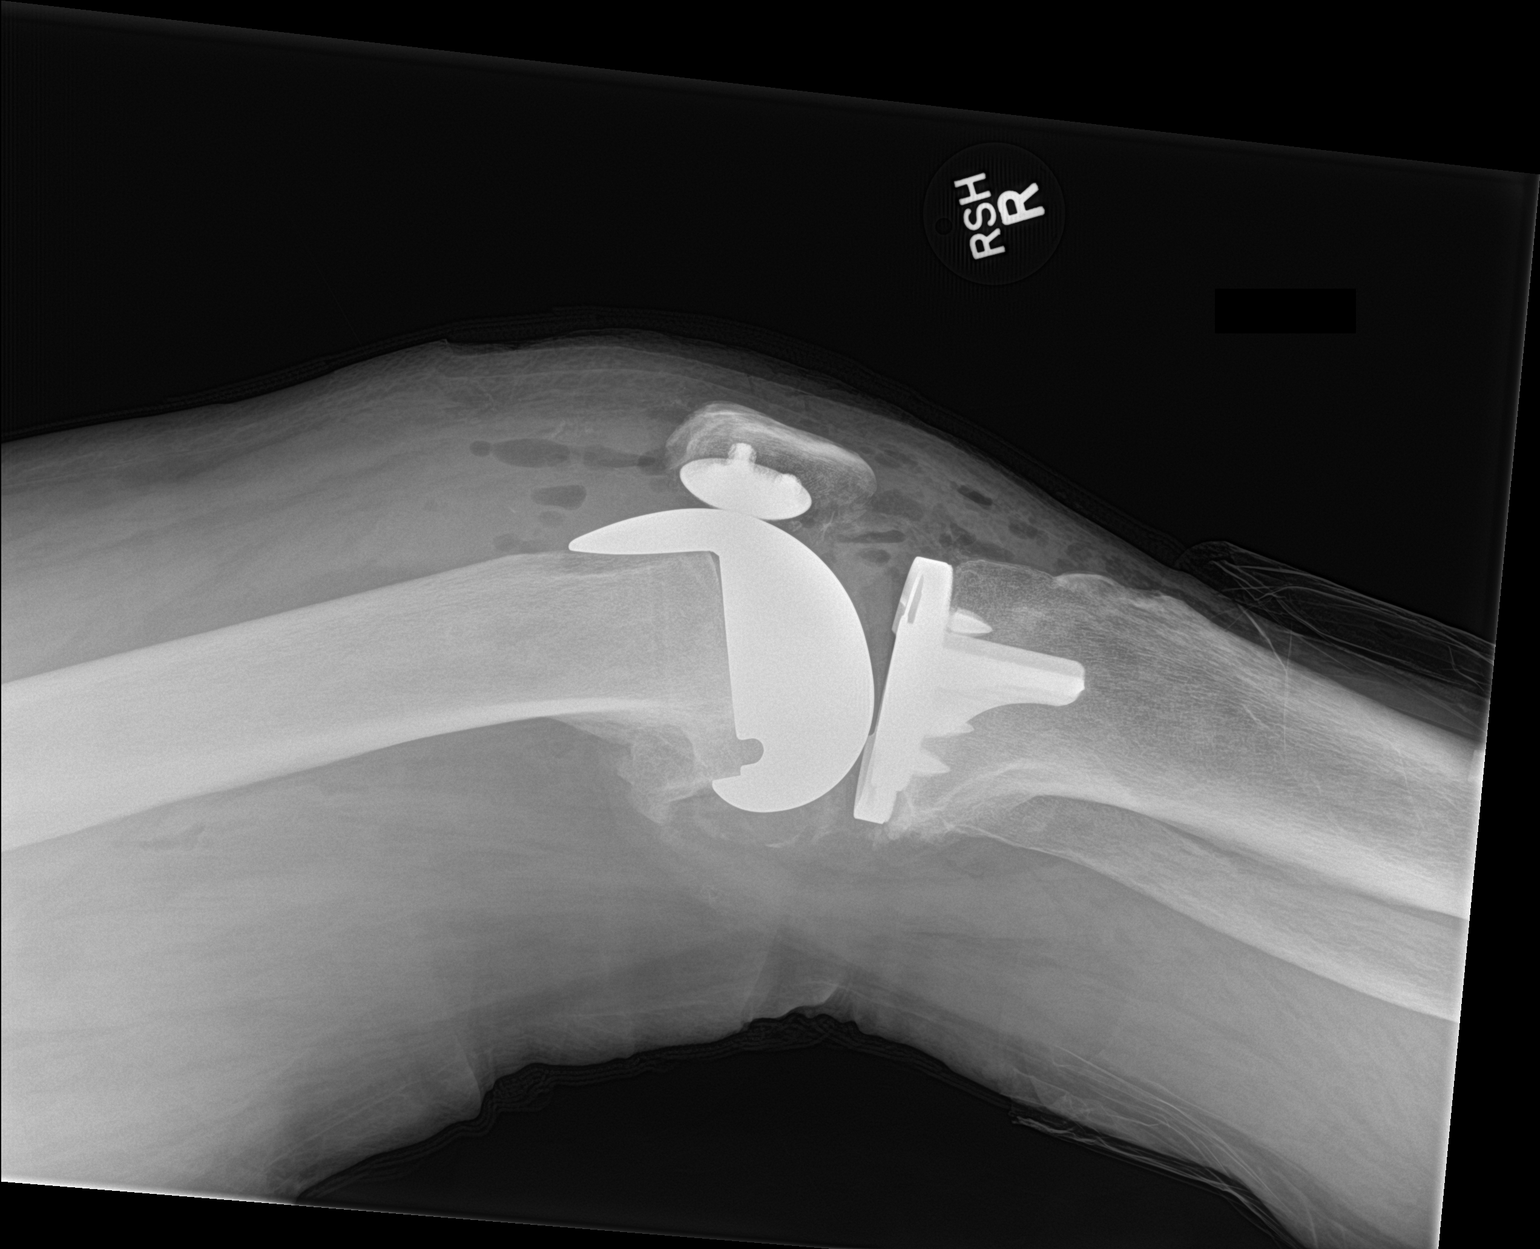

[2 of 2 positions shown; findings below may reference images not displayed]

FINDINGS: Patient is status post tricompartmental knee replacement. No
evidence for immediate hardware complications. Gas in the overlying
soft tissues is compatible with the immediate postoperative state.
IMPRESSION: Status post tricompartmental knee replacement without evidence for
immediate hardware complications.

## 2017-09-18 LAB — CUP PACEART REMOTE DEVICE CHECK
Brady Statistic AP VP Percent: 79 %
Brady Statistic AP VS Percent: 1 %
Brady Statistic AS VP Percent: 21 %
Brady Statistic AS VS Percent: 1 %
Brady Statistic RA Percent Paced: 79 %
Implantable Lead Implant Date: 20170615
Implantable Lead Implant Date: 20170615
Implantable Lead Location: 753858
Implantable Lead Location: 753860
Lead Channel Impedance Value: 460 Ohm
Lead Channel Impedance Value: 490 Ohm
Lead Channel Impedance Value: 930 Ohm
Lead Channel Pacing Threshold Amplitude: 0.875 V
Lead Channel Pacing Threshold Amplitude: 2.125 V
Lead Channel Pacing Threshold Pulse Width: 0.4 ms
Lead Channel Sensing Intrinsic Amplitude: 12 mV
Lead Channel Setting Pacing Amplitude: 3.125
Lead Channel Setting Pacing Pulse Width: 0.4 ms
Lead Channel Setting Sensing Sensitivity: 5 mV
MDC IDC LEAD IMPLANT DT: 20170615
MDC IDC LEAD LOCATION: 753859
MDC IDC MSMT BATTERY REMAINING LONGEVITY: 77 mo
MDC IDC MSMT BATTERY REMAINING PERCENTAGE: 95.5 %
MDC IDC MSMT BATTERY VOLTAGE: 2.98 V
MDC IDC MSMT LEADCHNL LV PACING THRESHOLD PULSEWIDTH: 0.4 ms
MDC IDC MSMT LEADCHNL RA PACING THRESHOLD AMPLITUDE: 0.75 V
MDC IDC MSMT LEADCHNL RA SENSING INTR AMPL: 5 mV
MDC IDC MSMT LEADCHNL RV PACING THRESHOLD PULSEWIDTH: 0.4 ms
MDC IDC PG IMPLANT DT: 20170615
MDC IDC PG SERIAL: 7894586
MDC IDC SESS DTM: 20190604164749
MDC IDC SET LEADCHNL LV PACING PULSEWIDTH: 0.4 ms
MDC IDC SET LEADCHNL RA PACING AMPLITUDE: 2 V
MDC IDC SET LEADCHNL RV PACING AMPLITUDE: 2 V

## 2017-11-27 ENCOUNTER — Telehealth: Payer: Self-pay | Admitting: Cardiology

## 2017-11-27 ENCOUNTER — Ambulatory Visit (INDEPENDENT_AMBULATORY_CARE_PROVIDER_SITE_OTHER): Payer: Medicare Other | Admitting: *Deleted

## 2017-11-27 DIAGNOSIS — I442 Atrioventricular block, complete: Secondary | ICD-10-CM | POA: Diagnosis not present

## 2017-11-27 NOTE — Telephone Encounter (Signed)
LMOVM reminding pt to send remote transmission.   

## 2017-11-28 NOTE — Progress Notes (Signed)
Remote pacemaker transmission.   

## 2017-12-03 ENCOUNTER — Other Ambulatory Visit: Payer: Self-pay | Admitting: Cardiology

## 2017-12-18 DIAGNOSIS — Z23 Encounter for immunization: Secondary | ICD-10-CM | POA: Diagnosis not present

## 2017-12-24 LAB — CUP PACEART REMOTE DEVICE CHECK
Battery Remaining Longevity: 76 mo
Battery Remaining Percentage: 95.5 %
Brady Statistic AP VP Percent: 80 %
Brady Statistic AS VS Percent: 1 %
Date Time Interrogation Session: 20190903181717
Implantable Lead Implant Date: 20170615
Implantable Lead Implant Date: 20170615
Implantable Lead Location: 753859
Implantable Lead Location: 753860
Lead Channel Impedance Value: 480 Ohm
Lead Channel Pacing Threshold Amplitude: 0.875 V
Lead Channel Pacing Threshold Pulse Width: 0.4 ms
Lead Channel Pacing Threshold Pulse Width: 0.4 ms
Lead Channel Pacing Threshold Pulse Width: 0.4 ms
Lead Channel Sensing Intrinsic Amplitude: 5 mV
Lead Channel Setting Pacing Amplitude: 2 V
Lead Channel Setting Pacing Amplitude: 3.75 V
Lead Channel Setting Pacing Pulse Width: 0.4 ms
MDC IDC LEAD IMPLANT DT: 20170615
MDC IDC LEAD LOCATION: 753858
MDC IDC MSMT BATTERY VOLTAGE: 2.98 V
MDC IDC MSMT LEADCHNL LV IMPEDANCE VALUE: 1025 Ohm
MDC IDC MSMT LEADCHNL LV PACING THRESHOLD AMPLITUDE: 2.75 V
MDC IDC MSMT LEADCHNL RA PACING THRESHOLD AMPLITUDE: 0.75 V
MDC IDC MSMT LEADCHNL RV IMPEDANCE VALUE: 460 Ohm
MDC IDC MSMT LEADCHNL RV SENSING INTR AMPL: 12 mV
MDC IDC PG IMPLANT DT: 20170615
MDC IDC SET LEADCHNL LV PACING PULSEWIDTH: 0.4 ms
MDC IDC SET LEADCHNL RA PACING AMPLITUDE: 2 V
MDC IDC SET LEADCHNL RV SENSING SENSITIVITY: 5 mV
MDC IDC STAT BRADY AP VS PERCENT: 1 %
MDC IDC STAT BRADY AS VP PERCENT: 20 %
MDC IDC STAT BRADY RA PERCENT PACED: 79 %
Pulse Gen Model: 3262
Pulse Gen Serial Number: 7894586

## 2018-01-21 ENCOUNTER — Telehealth: Payer: Self-pay

## 2018-01-21 DIAGNOSIS — M25572 Pain in left ankle and joints of left foot: Secondary | ICD-10-CM | POA: Diagnosis not present

## 2018-01-21 DIAGNOSIS — M25561 Pain in right knee: Secondary | ICD-10-CM | POA: Diagnosis not present

## 2018-01-21 NOTE — Telephone Encounter (Signed)
   [facility] Pre-operative Risk Assessment    Request for surgical clearance:  1. What type of surgery is being performed? Left foot cheilectomy   2. When is this surgery scheduled? TBD   3. What type of clearance is required (medical clearance vs. Pharmacy clearance to hold med vs. Both)? medical  4. Are there any medications that need to be held prior to surgery and how long? ASA 81 mg/ not specified  5. Practice name and name of physician performing surgery? [facility]/ Dr. Edmonia Lynch  6. What is your office phone number [phone]    7.   What is your office fax number [phone]  8.   Anesthesia type (None, local, MAC, general) ? Not specified   Harold Robertson 01/21/2018, 4:00 PM  _________________________________________________________________   (provider comments below)

## 2018-01-22 NOTE — Telephone Encounter (Signed)
Left a detailed message for pt that he needs to make an appt for surgical clearance. It has been 3 years since he's been seen by Dr. Duke Salvia and that he would be considered a new pt. Left a message for pt to call back.

## 2018-01-22 NOTE — Telephone Encounter (Signed)
   Primary Cardiologist:Tiffany Duke Salvia, MD  Chart reviewed as part of pre-operative protocol coverage. Because of Jeptha Lamance Tun's past medical history and time since last visit, he/she will require a follow-up visit in order to better assess preoperative cardiovascular risk.  Pre-op covering staff: - Please schedule appointment and call patient to inform them. - Please contact requesting surgeon's office via preferred method (i.e, phone, fax) to inform them of need for appointment prior to surgery.   [place], [place]  01/22/2018, 1:43 PM

## 2018-01-23 NOTE — Telephone Encounter (Signed)
I s/w the pt in regards to needing an appt since he has not seen [facility] in 3 yrs. Pt last saw Dr. Chilton Si 01/15/15. Pt does see Dr. Elberta Fortis for EP. I called the pt and advised he is probably going to need an New Pt appt with [facility] before he can be cleared for surgery. Pt states that Dr. Elberta Fortis cleared pt last year for his knee surgery an would like to be cleared by the same provider again. I did also explain to the pt that our Pre Op Protocol is pt would need to be seen within 6 months of surgery. Pt was just seen in March 2019. I advised the pt I will need to send a message to Dr. Girard Cooter to see if he will clear the pt for his surgery and that we will let him know of recommendations. Pt thanked me for the call.  I will route this message to Dr. Elberta Fortis.

## 2018-01-23 NOTE — Telephone Encounter (Signed)
Follow up    Patient is returning call in reference to pre-op clearance. He normally sees Dr. Elberta Fortis so he is wondering does he still need to schedule an appointment with general cardiology. Please call to discuss.

## 2018-01-24 NOTE — Telephone Encounter (Signed)
I will route to Pre OP for appt to be made

## 2018-01-24 NOTE — Telephone Encounter (Addendum)
The patient will need appointment with Dr. Elberta Fortis or APP.

## 2018-01-24 NOTE — Telephone Encounter (Signed)
Would need to be seen by me or an APP for preop since >6 months.

## 2018-01-28 ENCOUNTER — Ambulatory Visit (INDEPENDENT_AMBULATORY_CARE_PROVIDER_SITE_OTHER): Payer: Medicare Other | Admitting: Family Medicine

## 2018-01-28 ENCOUNTER — Encounter: Payer: Self-pay | Admitting: Family Medicine

## 2018-01-28 VITALS — BP 128/74 | HR 70 | Temp 98.0°F | Resp 18 | Ht 71.0 in | Wt 290.0 lb

## 2018-01-28 DIAGNOSIS — Z01818 Encounter for other preprocedural examination: Secondary | ICD-10-CM

## 2018-01-28 DIAGNOSIS — Z125 Encounter for screening for malignant neoplasm of prostate: Secondary | ICD-10-CM

## 2018-01-28 DIAGNOSIS — I5020 Unspecified systolic (congestive) heart failure: Secondary | ICD-10-CM | POA: Diagnosis not present

## 2018-01-28 DIAGNOSIS — I2510 Atherosclerotic heart disease of native coronary artery without angina pectoris: Secondary | ICD-10-CM

## 2018-01-28 DIAGNOSIS — Z23 Encounter for immunization: Secondary | ICD-10-CM

## 2018-01-28 DIAGNOSIS — I519 Heart disease, unspecified: Secondary | ICD-10-CM | POA: Diagnosis not present

## 2018-01-28 DIAGNOSIS — R931 Abnormal findings on diagnostic imaging of heart and coronary circulation: Secondary | ICD-10-CM | POA: Diagnosis not present

## 2018-01-28 DIAGNOSIS — I10 Essential (primary) hypertension: Secondary | ICD-10-CM | POA: Diagnosis not present

## 2018-01-28 NOTE — Telephone Encounter (Signed)
Attempted to reach pt but there was no answer. Left message for pt to call preop pool back.

## 2018-01-28 NOTE — Progress Notes (Signed)
Subjective:    Patient ID: Harold Robertson, male    DOB: [birth year], 67 y.o.   MRN: [id number]  HPI Patient is here today for preoperative surgical clearance.  He is scheduled to have an elective surgery performed on his left foot.  History is significant for atrioventricular block, complete heart block, requiring pacemaker placement.  Apparently he was hospitalized with a heart rate in the 20s per his report requiring pacemaker placement.  At that time in 2017, his ejection fraction was 40 to 45% on echocardiogram.  He is done well since his pacemaker has been placed and follows up regularly with his EP specialist Dr. Elberta Fortis.  He denies any syncope.  He denies any bradycardia.  He denies any chest pain or shortness of breath.  He denies any orthopnea or paroxysmal nocturnal dyspnea.  However he has not had a repeat echocardiogram since the placement of his pacemaker in 2017.  He denies any angina.  He denies any melena.  He denies any hematochezia.  His colonoscopy is up-to-date however he is due for prostate cancer screening.  He is also due for Pneumovax 23.  He has had his flu shot. Past Medical History:  Diagnosis Date  . Arthritis   . Complete heart block (HCC)   . Hypertension   . OSA on CPAP   . Presence of permanent cardiac pacemaker    Past Surgical History:  Procedure Laterality Date  . APPENDECTOMY  1960s  . CARDIAC CATHETERIZATION Right 09/08/2015   Procedure: Temporary Pacemaker;  Surgeon: Will Jorja Loa, MD;  Location: [facility] INVASIVE CV LAB;  Service: Cardiovascular;  Laterality: Right;  . CARDIAC CATHETERIZATION N/A 09/09/2015   Procedure: Left Heart Cath and Coronary Angiography;  Surgeon: Peter M Swaziland, MD;  Location: [facility] INVASIVE CV LAB;  Service: Cardiovascular;  Laterality: N/A;  . COLONOSCOPY    . EP IMPLANTABLE DEVICE N/A 09/09/2015   Procedure: BiV Pacemaker Insertion CRT-P;  Surgeon: Will Jorja Loa, MD;  Location: [facility] INVASIVE CV LAB;  Service: Cardiovascular;   Laterality: N/A;  . KNEE SURGERY Right   . TOTAL KNEE ARTHROPLASTY Right 07/11/2016  . TOTAL KNEE ARTHROPLASTY Right 07/11/2016   Procedure: TOTAL KNEE ARTHROPLASTY;  Surgeon: Sheral Apley, MD;  Location: [facility] OR;  Service: Orthopedics;  Laterality: Right;   Current Outpatient Medications on File Prior to Visit  Medication Sig Dispense Refill  . aspirin 81 MG tablet Take 81 mg by mouth daily.    Marland Kitchen atorvastatin (LIPITOR) 40 MG tablet TAKE 1 TABLET BY MOUTH EVERY DAY AT 6PM 90 tablet 2  . carvedilol (COREG) 12.5 MG tablet TAKE 1 TABLET BY MOUTH TWICE A DAY 180 tablet 2  . cholecalciferol (VITAMIN D) 1000 units tablet Take 1 tablet (1,000 Units total) by mouth daily. 90 tablet 1  . lisinopril (PRINIVIL,ZESTRIL) 40 MG tablet TAKE 1 TABLET BY MOUTH EVERY DAY 90 tablet 1   No current facility-administered medications on file prior to visit.    Allergies  Allergen Reactions  . No Known Allergies    Social History   Socioeconomic History  . Marital status: Married    Spouse name: Not on file  . Number of children: Not on file  . Years of education: Not on file  . Highest education level: Not on file  Occupational History  . Occupation: truck Public librarian Needs  . Financial resource strain: Not on file  . Food insecurity:    Worry: Not on file    Inability: Not  on file  . Transportation needs:    Medical: Not on file    Non-medical: Not on file  Tobacco Use  . Smoking status: Current Some Day Smoker    Years: 6.00    Types: Pipe  . Smokeless tobacco: Former Neurosurgeon    Types: Snuff  Substance and Sexual Activity  . Alcohol use: Yes    Comment: occ  . Drug use: No  . Sexual activity: Yes  Lifestyle  . Physical activity:    Days per week: Not on file    Minutes per session: Not on file  . Stress: Not on file  Relationships  . Social connections:    Talks on phone: Not on file    Gets together: Not on file    Attends religious service: Not on file    Active member of  club or organization: Not on file    Attends meetings of clubs or organizations: Not on file    Relationship status: Not on file  . Intimate partner violence:    Fear of current or ex partner: Not on file    Emotionally abused: Not on file    Physically abused: Not on file    Forced sexual activity: Not on file  Other Topics Concern  . Not on file  Social History Narrative   Marital status: Married x 30 years; first marriage     Children: 2 children; no grandchildren     Lives: lives with wife      Employment: truck driver x 14 years; [place]        Tobacco:  None; pipes and cigars; chews tobacco.       Alcohol:  Weekends.      Drugs:  None      Exercise:  Rarely.      Seatbelt:  100%      Guns:  Loaded and secured.      Review of Systems  All other systems reviewed and are negative.      Objective:   Physical Exam  Constitutional: He appears well-developed and well-nourished.  HENT:  Head: Normocephalic and atraumatic.  Eyes: Pupils are equal, round, and reactive to light. Conjunctivae and EOM are normal.  Neck: Neck supple. No thyromegaly present.  Cardiovascular: Normal rate, regular rhythm, normal heart sounds and intact distal pulses.  No murmur heard. Pulmonary/Chest: Effort normal and breath sounds normal. No stridor. No respiratory distress. He has no wheezes. He has no rales.  Abdominal: Soft. Bowel sounds are normal. He exhibits no distension and no mass. There is no tenderness. There is no rebound and no guarding. No hernia.  Musculoskeletal: He exhibits no edema.  Skin: No rash noted.  Vitals reviewed.         Assessment & Plan:  Preop examination  Left ventricular dysfunction - Plan: ECHOCARDIOGRAM COMPLETE  Essential hypertension - Plan: CBC with Differential/Platelet, COMPLETE METABOLIC PANEL WITH GFR, PSA, Lipid panel  Prostate cancer screening - Plan: PSA  Decreased cardiac ejection fraction - Plan: ECHOCARDIOGRAM COMPLETE  Systolic  congestive heart failure, NYHA class 1, unspecified congestive heart failure chronicity (HCC) - Plan: ECHOCARDIOGRAM COMPLETE  There are no contraindications for the patient to proceed with surgery from a medical standpoint.  I will check a CBC to rule out anemia.  I will also check a CMP to rule out any electrolyte disturbances or renal issues.  While checking lab work I will screen the patient for prostate cancer with a PSA.  I will also  check his cholesterol with a fasting lipid panel.  I do not see a repeat echocardiogram since 2017.  At that time his ejection fraction was 40 to 45%.  However this was done prior to him receiving his pacemaker at that time he had diffuse hypokinesis.  By symptoms, the patient is class I.  He denies any shortness of breath with activity.  Therefore I will repeat an echocardiogram.  If the patient still has diminished ejection fraction, I would recommend follow-up with his cardiologist Dr. Duke Salvia as well as optimizing his medical therapy if necessary.  Patient received Pneumovax 23 today.

## 2018-01-28 NOTE — Addendum Note (Signed)
Addended by: Legrand Rams B on: 01/28/2018 04:25 PM   Modules accepted: Orders

## 2018-01-28 NOTE — Telephone Encounter (Signed)
Thread still in pre-op box. I do not see appt for pre-op clearance. Can you schedule pt. See attached notes.   I will remove from Preop box

## 2018-01-29 ENCOUNTER — Other Ambulatory Visit: Payer: Self-pay | Admitting: Family Medicine

## 2018-01-29 DIAGNOSIS — R17 Unspecified jaundice: Secondary | ICD-10-CM

## 2018-01-29 LAB — CBC WITH DIFFERENTIAL/PLATELET
BASOS ABS: 42 {cells}/uL (ref 0–200)
Basophils Relative: 0.6 %
Eosinophils Absolute: 238 cells/uL (ref 15–500)
Eosinophils Relative: 3.4 %
HCT: 46.5 % (ref 38.5–50.0)
HEMOGLOBIN: 16.3 g/dL (ref 13.2–17.1)
Lymphs Abs: 1841 cells/uL (ref 850–3900)
MCH: 30.5 pg (ref 27.0–33.0)
MCHC: 35.1 g/dL (ref 32.0–36.0)
MCV: 87.1 fL (ref 80.0–100.0)
MONOS PCT: 7.8 %
MPV: 10.8 fL (ref 7.5–12.5)
NEUTROS ABS: 4333 {cells}/uL (ref 1500–7800)
NEUTROS PCT: 61.9 %
Platelets: 199 10*3/uL (ref 140–400)
RBC: 5.34 10*6/uL (ref 4.20–5.80)
RDW: 12.5 % (ref 11.0–15.0)
Total Lymphocyte: 26.3 %
WBC mixed population: 546 cells/uL (ref 200–950)
WBC: 7 10*3/uL (ref 3.8–10.8)

## 2018-01-29 LAB — COMPLETE METABOLIC PANEL WITH GFR
AG Ratio: 1.6 (calc) (ref 1.0–2.5)
ALKALINE PHOSPHATASE (APISO): 68 U/L (ref 40–115)
ALT: 20 U/L (ref 9–46)
AST: 17 U/L (ref 10–35)
Albumin: 4.2 g/dL (ref 3.6–5.1)
BUN: 13 mg/dL (ref 7–25)
CO2: 27 mmol/L (ref 20–32)
CREATININE: 1.08 mg/dL (ref 0.70–1.25)
Calcium: 9.7 mg/dL (ref 8.6–10.3)
Chloride: 102 mmol/L (ref 98–110)
GFR, Est African American: 82 mL/min/{1.73_m2} (ref >=60)
GFR, Est Non African American: 71 mL/min/{1.73_m2} (ref >=60)
GLUCOSE: 92 mg/dL (ref 65–99)
Globulin: 2.7 g/dL (calc) (ref 1.9–3.7)
Potassium: 4.9 mmol/L (ref 3.5–5.3)
Sodium: 137 mmol/L (ref 135–146)
Total Bilirubin: 1.6 mg/dL — ABNORMAL HIGH (ref 0.2–1.2)
Total Protein: 6.9 g/dL (ref 6.1–8.1)

## 2018-01-29 LAB — LIPID PANEL
CHOLESTEROL: 157 mg/dL (ref <200)
HDL: 42 mg/dL (ref >40)
LDL Cholesterol (Calc): 88 mg/dL (calc)
Non-HDL Cholesterol (Calc): 115 mg/dL (calc) (ref <130)
Total CHOL/HDL Ratio: 3.7 (calc) (ref <5.0)
Triglycerides: 163 mg/dL — ABNORMAL HIGH (ref <150)

## 2018-01-29 LAB — PSA: PSA: 0.9 ng/mL (ref <=4.0)

## 2018-01-29 NOTE — Telephone Encounter (Signed)
Pt is scheduled to see Gypsy Balsam NP, on 02/07/18, Clearance will be addressed at visit,.

## 2018-02-01 ENCOUNTER — Ambulatory Visit (HOSPITAL_COMMUNITY): Payer: Medicare Other | Attending: Internal Medicine

## 2018-02-01 ENCOUNTER — Other Ambulatory Visit: Payer: Self-pay

## 2018-02-01 DIAGNOSIS — R931 Abnormal findings on diagnostic imaging of heart and coronary circulation: Secondary | ICD-10-CM

## 2018-02-01 DIAGNOSIS — I519 Heart disease, unspecified: Secondary | ICD-10-CM | POA: Insufficient documentation

## 2018-02-01 DIAGNOSIS — I5020 Unspecified systolic (congestive) heart failure: Secondary | ICD-10-CM | POA: Insufficient documentation

## 2018-02-01 MED ORDER — PERFLUTREN LIPID MICROSPHERE
1.0000 mL | INTRAVENOUS | Status: AC | PRN
  Administered 2018-02-01: 3 mL via INTRAVENOUS

## 2018-02-06 NOTE — Progress Notes (Signed)
Electrophysiology Office Note Date: 02/08/2018  ID:  JARRIS KORTZ, DOB [birth year], MRN [id number]  PCP: Donita Brooks, MD Primary Cardiologist: Duke Salvia Electrophysiologist: Elberta Fortis  CC: Pacemaker follow-up  Harold Robertson is a 67 y.o. male seen today for Dr Elberta Fortis.  He presents today for routine electrophysiology followup.  Since last being seen in our clinic, the patient reports doing relatively well.  He is pending hammer toe surgery with Delbert Harness.  He denies chest pain, palpitations, dyspnea, PND, orthopnea, nausea, vomiting, dizziness, syncope, edema, weight gain, or early satiety.  Device History: STJ CRTP implanted 2017 for CHB   Past Medical History:  Diagnosis Date  . Arthritis   . Complete heart block (HCC)   . Hypertension   . OSA on CPAP   . Presence of permanent cardiac pacemaker    Past Surgical History:  Procedure Laterality Date  . APPENDECTOMY  1960s  . CARDIAC CATHETERIZATION Right 09/08/2015   Procedure: Temporary Pacemaker;  Surgeon: Will Jorja Loa, MD;  Location: [facility] INVASIVE CV LAB;  Service: Cardiovascular;  Laterality: Right;  . CARDIAC CATHETERIZATION N/A 09/09/2015   Procedure: Left Heart Cath and Coronary Angiography;  Surgeon: Peter M Swaziland, MD;  Location: [facility] INVASIVE CV LAB;  Service: Cardiovascular;  Laterality: N/A;  . COLONOSCOPY    . EP IMPLANTABLE DEVICE N/A 09/09/2015   Procedure: BiV Pacemaker Insertion CRT-P;  Surgeon: Will Jorja Loa, MD;  Location: [facility] INVASIVE CV LAB;  Service: Cardiovascular;  Laterality: N/A;  . KNEE SURGERY Right   . TOTAL KNEE ARTHROPLASTY Right 07/11/2016  . TOTAL KNEE ARTHROPLASTY Right 07/11/2016   Procedure: TOTAL KNEE ARTHROPLASTY;  Surgeon: Sheral Apley, MD;  Location: [facility] OR;  Service: Orthopedics;  Laterality: Right;    Current Outpatient Medications  Medication Sig Dispense Refill  . aspirin 81 MG tablet Take 81 mg by mouth daily.    Marland Kitchen atorvastatin (LIPITOR) 40 MG tablet TAKE  1 TABLET BY MOUTH EVERY DAY AT 6PM 90 tablet 2  . carvedilol (COREG) 12.5 MG tablet TAKE 1 TABLET BY MOUTH TWICE A DAY 180 tablet 2  . cholecalciferol (VITAMIN D) 1000 units tablet Take 1 tablet (1,000 Units total) by mouth daily. 90 tablet 1  . lisinopril (PRINIVIL,ZESTRIL) 40 MG tablet TAKE 1 TABLET BY MOUTH EVERY DAY 90 tablet 1   No current facility-administered medications for this visit.     Allergies:   No known allergies   Social History: Social History   Socioeconomic History  . Marital status: Married    Spouse name: Not on file  . Number of children: Not on file  . Years of education: Not on file  . Highest education level: Not on file  Occupational History  . Occupation: truck Public librarian Needs  . Financial resource strain: Not on file  . Food insecurity:    Worry: Not on file    Inability: Not on file  . Transportation needs:    Medical: Not on file    Non-medical: Not on file  Tobacco Use  . Smoking status: Current Some Day Smoker    Years: 6.00    Types: Pipe  . Smokeless tobacco: Former Neurosurgeon    Types: Snuff  Substance and Sexual Activity  . Alcohol use: Yes    Comment: occ  . Drug use: No  . Sexual activity: Yes  Lifestyle  . Physical activity:    Days per week: Not on file    Minutes per session: Not on file  .  Stress: Not on file  Relationships  . Social connections:    Talks on phone: Not on file    Gets together: Not on file    Attends religious service: Not on file    Active member of club or organization: Not on file    Attends meetings of clubs or organizations: Not on file    Relationship status: Not on file  . Intimate partner violence:    Fear of current or ex partner: Not on file    Emotionally abused: Not on file    Physically abused: Not on file    Forced sexual activity: Not on file  Other Topics Concern  . Not on file  Social History Narrative   Marital status: Married x 30 years; first marriage     Children: 2 children;  no grandchildren     Lives: lives with wife      Employment: truck driver x 14 years; [place]        Tobacco:  None; pipes and cigars; chews tobacco.       Alcohol:  Weekends.      Drugs:  None      Exercise:  Rarely.      Seatbelt:  100%      Guns:  Loaded and secured.    Family History: Family History  Problem Relation Age of Onset  . Heart disease Father 72       AMI  . Liver cancer Mother   . Cancer Mother 22       Colon cancer with liver mets  . Heart disease Brother 50       AMI  . Heart disease Maternal Grandmother      Review of Systems: All other systems reviewed and are otherwise negative except as noted above.   Physical Exam: VS:  BP 120/82   Pulse 60   Ht 5\' 11"  (1.803 m)   Wt 298 lb 9.6 oz (135.4 kg)   SpO2 94%   BMI 41.65 kg/m  , BMI Body mass index is 41.65 kg/m.  GEN- The patient is well appearing, alert and oriented x 3 today.   HEENT: normocephalic, atraumatic; sclera clear, conjunctiva pink; hearing intact; oropharynx clear; neck supple  Lungs- Clear to ausculation bilaterally, normal work of breathing.  No wheezes, rales, rhonchi Heart- Regular rate and rhythm (paced) GI- soft, non-tender, non-distended, bowel sounds present  Extremities- no clubbing, cyanosis, or edema  MS- no significant deformity or atrophy Skin- warm and dry, no rash or lesion; PPM pocket well healed Psych- euthymic mood, full affect Neuro- strength and sensation are intact  PPM Interrogation- reviewed in detail today,  See PACEART report  EKG:  EKG is ordered today. The ekg ordered today shows sinus rhythm with V pacing  Recent Labs: 01/28/2018: ALT 20; BUN 13; Creat 1.08; Hemoglobin 16.3; Platelets 199; Potassium 4.9; Sodium 137   Wt Readings from Last 3 Encounters:  02/07/18 298 lb 9.6 oz (135.4 kg)  01/28/18 290 lb (131.5 kg)  06/01/17 289 lb 9.6 oz (131.4 kg)     Other studies Reviewed: Additional studies/ records that were reviewed today include: Dr  Elberta Fortis' office notes  Assessment and Plan:  1.  Complete heart block Normal PPM function See Pace Art report No changes today Echo reviewed today. EF has normalized with CRT.   2.  HTN Stable No change required today  3.  Surgical clearance  Risk of major perioperative cardiac complication is 0.4% He is able to  achieve >4 METs without chest pain or shortness of breath.  He is at low risk from a cardiac standpoint for surgery.    Current medicines are reviewed at length with the patient today.   The patient does not have concerns regarding his medicines.  The following changes were made today:  none  Labs/ tests ordered today include: none Orders Placed This Encounter  Procedures  . CUP PACEART INCLINIC DEVICE CHECK  . EKG 12-Lead     Disposition:   Follow up with Arsenio Katz, Dr Elberta Fortis 1 year     Signed, Gypsy Balsam, NP 02/08/2018 5:25 PM  [facility] [address] [place] [address] [phone] (office) [phone] (fax)

## 2018-02-07 ENCOUNTER — Encounter: Payer: Self-pay | Admitting: Nurse Practitioner

## 2018-02-07 ENCOUNTER — Ambulatory Visit (INDEPENDENT_AMBULATORY_CARE_PROVIDER_SITE_OTHER): Payer: Medicare Other | Admitting: Nurse Practitioner

## 2018-02-07 VITALS — BP 120/82 | HR 60 | Ht 71.0 in | Wt 298.6 lb

## 2018-02-07 DIAGNOSIS — Z01818 Encounter for other preprocedural examination: Secondary | ICD-10-CM | POA: Diagnosis not present

## 2018-02-07 DIAGNOSIS — I2510 Atherosclerotic heart disease of native coronary artery without angina pectoris: Secondary | ICD-10-CM | POA: Diagnosis not present

## 2018-02-07 DIAGNOSIS — I442 Atrioventricular block, complete: Secondary | ICD-10-CM

## 2018-02-07 DIAGNOSIS — I10 Essential (primary) hypertension: Secondary | ICD-10-CM

## 2018-02-07 LAB — CUP PACEART INCLINIC DEVICE CHECK
Date Time Interrogation Session: 20191114085531
Implantable Lead Implant Date: 20170615
Implantable Lead Implant Date: 20170615
Implantable Lead Location: 753859
Implantable Lead Location: 753860
Implantable Pulse Generator Implant Date: 20170615
MDC IDC LEAD IMPLANT DT: 20170615
MDC IDC LEAD LOCATION: 753858
Pulse Gen Serial Number: 7894586

## 2018-02-07 NOTE — Patient Instructions (Signed)
Medication Instructions:  Your physician recommends that you continue on your current medications as directed. Please refer to the Current Medication list given to you today.   If you need a refill on your cardiac medications before your next appointment, please call your pharmacy.   Lab work: NONE ORDERED  TODAY  If you have labs (blood work) drawn today and your tests are completely normal, you will receive your results only by: Marland Kitchen MyChart Message (if you have MyChart) OR . A paper copy in the mail If you have any lab test that is abnormal or we need to change your treatment, we will call you to review the results.  Testing/Procedures: NONE ORDERED  TODAY   Follow-Up: At [facility], you and your health needs are our priority.  As part of our continuing mission to provide you with exceptional heart care, we have created designated Provider Care Teams.  These Care Teams include your primary Cardiologist (physician) and Advanced Practice Providers (APPs -  Physician Assistants and Nurse Practitioners) who all work together to provide you with the care you need, when you need it. You will need a follow up appointment in 12 months.  Please call our office 2 months in advance to schedule this appointment.  You may see Will Jorja Loa, MD or one of the following Advanced Practice Providers on your designated Care Team:   Gypsy Balsam, NP . Francis Dowse, PA-C  Any Other Special Instructions Will Be Listed Below (If Applicable).

## 2018-02-26 ENCOUNTER — Ambulatory Visit (INDEPENDENT_AMBULATORY_CARE_PROVIDER_SITE_OTHER): Payer: Medicare Other

## 2018-02-26 DIAGNOSIS — I442 Atrioventricular block, complete: Secondary | ICD-10-CM | POA: Diagnosis not present

## 2018-02-26 NOTE — Progress Notes (Signed)
Remote pacemaker transmission.   

## 2018-02-27 ENCOUNTER — Other Ambulatory Visit: Payer: Medicare Other

## 2018-02-27 DIAGNOSIS — R17 Unspecified jaundice: Secondary | ICD-10-CM | POA: Diagnosis not present

## 2018-02-28 LAB — COMPREHENSIVE METABOLIC PANEL
AG Ratio: 2 (calc) (ref 1.0–2.5)
ALT: 29 U/L (ref 9–46)
AST: 21 U/L (ref 10–35)
Albumin: 4.3 g/dL (ref 3.6–5.1)
Alkaline phosphatase (APISO): 81 U/L (ref 40–115)
BUN: 17 mg/dL (ref 7–25)
CALCIUM: 9.3 mg/dL (ref 8.6–10.3)
CO2: 26 mmol/L (ref 20–32)
Chloride: 107 mmol/L (ref 98–110)
Creat: 0.98 mg/dL (ref 0.70–1.25)
Globulin: 2.2 g/dL (calc) (ref 1.9–3.7)
Glucose, Bld: 96 mg/dL (ref 65–99)
Potassium: 4.6 mmol/L (ref 3.5–5.3)
SODIUM: 143 mmol/L (ref 135–146)
TOTAL PROTEIN: 6.5 g/dL (ref 6.1–8.1)
Total Bilirubin: 1 mg/dL (ref 0.2–1.2)

## 2018-03-05 ENCOUNTER — Encounter: Payer: Self-pay | Admitting: Cardiology

## 2018-03-14 ENCOUNTER — Other Ambulatory Visit: Payer: Self-pay

## 2018-03-14 ENCOUNTER — Ambulatory Visit: Payer: Self-pay | Admitting: Physician Assistant

## 2018-03-14 ENCOUNTER — Encounter (HOSPITAL_BASED_OUTPATIENT_CLINIC_OR_DEPARTMENT_OTHER): Payer: Self-pay | Admitting: *Deleted

## 2018-03-14 NOTE — H&P (Signed)
Harold Robertson is an 67 y.o. male.   Chief Complaint: left foot bone spur  HPI: complains of a bone spur on his left foot.  Again, no complaints with the knee.  With regards to his foot, he has a large bone spur.  It hurts with any shoes that are not really loose.  Otherwise no pain with ambulating.    Past Medical History:  Diagnosis Date  . Arthritis   . Complete heart block (HCC)   . Hypertension   . OSA on CPAP   . Presence of permanent cardiac pacemaker     Past Surgical History:  Procedure Laterality Date  . APPENDECTOMY  1960s  . CARDIAC CATHETERIZATION Right 09/08/2015   Procedure: Temporary Pacemaker;  Surgeon: Will Jorja Loa, MD;  Location: [facility] INVASIVE CV LAB;  Service: Cardiovascular;  Laterality: Right;  . CARDIAC CATHETERIZATION N/A 09/09/2015   Procedure: Left Heart Cath and Coronary Angiography;  Surgeon: Peter M Swaziland, MD;  Location: [facility] INVASIVE CV LAB;  Service: Cardiovascular;  Laterality: N/A;  . COLONOSCOPY    . EP IMPLANTABLE DEVICE N/A 09/09/2015   Procedure: BiV Pacemaker Insertion CRT-P;  Surgeon: Will Jorja Loa, MD;  Location: [facility] INVASIVE CV LAB;  Service: Cardiovascular;  Laterality: N/A;  . KNEE SURGERY Right   . TOTAL KNEE ARTHROPLASTY Right 07/11/2016  . TOTAL KNEE ARTHROPLASTY Right 07/11/2016   Procedure: TOTAL KNEE ARTHROPLASTY;  Surgeon: Sheral Apley, MD;  Location: [facility] OR;  Service: Orthopedics;  Laterality: Right;    Family History  Problem Relation Age of Onset  . Heart disease Father 74       AMI  . Liver cancer Mother   . Cancer Mother 68       Colon cancer with liver mets  . Heart disease Brother 50       AMI  . Heart disease Maternal Grandmother    Social History:  reports that he has quit smoking. He quit after 6.00 years of use. He has quit using smokeless tobacco.  His smokeless tobacco use included snuff. He reports current alcohol use. He reports that he does not use drugs.  Allergies:  Allergies  Allergen Reactions   . No Known Allergies     (Not in a hospital admission)   No results found for this or any previous visit (from the past 48 hour(s)). No results found.  Review of Systems  HENT: Positive for hearing loss.   Musculoskeletal: Positive for joint pain.  All other systems reviewed and are negative.   There were no vitals taken for this visit. Physical Exam  Constitutional: He is oriented to person, place, and time. He appears well-developed and well-nourished. No distress.  HENT:  Head: Normocephalic.  Eyes: Pupils are equal, round, and reactive to light. EOM are normal.  Neck: Normal range of motion.  Cardiovascular: Normal rate and intact distal pulses.  Respiratory: Effort normal. No respiratory distress.  Musculoskeletal:     Left ankle: Tenderness.     Comments:   He has a mild hallux valgus, but relatively well maintained alignment.  He has significantly prominent osteophyte off of his distal metatarsal.  Neurovascularly intact.  Neurological: He is alert and oriented to person, place, and time.  Skin: Skin is warm and dry. No rash noted. No erythema.  Psychiatric: He has a normal mood and affect. His behavior is normal.     Assessment/Plan Left foot bone spur     Bone spur on the left foot.  I  discussed performing a cheilectomy with capsular repair and he would like to go forward with that.    Zayaan Kozak, PA-C 03/14/2018, 12:02 PM   

## 2018-03-14 NOTE — H&P (View-Only) (Signed)
Harold Robertson is an 67 y.o. male.   Chief Complaint: left foot bone spur  HPI: complains of a bone spur on his left foot.  Again, no complaints with the knee.  With regards to his foot, he has a large bone spur.  It hurts with any shoes that are not really loose.  Otherwise no pain with ambulating.    Past Medical History:  Diagnosis Date  . Arthritis   . Complete heart block (HCC)   . Hypertension   . OSA on CPAP   . Presence of permanent cardiac pacemaker     Past Surgical History:  Procedure Laterality Date  . APPENDECTOMY  1960s  . CARDIAC CATHETERIZATION Right 09/08/2015   Procedure: Temporary Pacemaker;  Surgeon: Will Jorja Loa, MD;  Location: [facility] INVASIVE CV LAB;  Service: Cardiovascular;  Laterality: Right;  . CARDIAC CATHETERIZATION N/A 09/09/2015   Procedure: Left Heart Cath and Coronary Angiography;  Surgeon: Peter M Swaziland, MD;  Location: [facility] INVASIVE CV LAB;  Service: Cardiovascular;  Laterality: N/A;  . COLONOSCOPY    . EP IMPLANTABLE DEVICE N/A 09/09/2015   Procedure: BiV Pacemaker Insertion CRT-P;  Surgeon: Will Jorja Loa, MD;  Location: [facility] INVASIVE CV LAB;  Service: Cardiovascular;  Laterality: N/A;  . KNEE SURGERY Right   . TOTAL KNEE ARTHROPLASTY Right 07/11/2016  . TOTAL KNEE ARTHROPLASTY Right 07/11/2016   Procedure: TOTAL KNEE ARTHROPLASTY;  Surgeon: Sheral Apley, MD;  Location: [facility] OR;  Service: Orthopedics;  Laterality: Right;    Family History  Problem Relation Age of Onset  . Heart disease Father 74       AMI  . Liver cancer Mother   . Cancer Mother 68       Colon cancer with liver mets  . Heart disease Brother 50       AMI  . Heart disease Maternal Grandmother    Social History:  reports that he has quit smoking. He quit after 6.00 years of use. He has quit using smokeless tobacco.  His smokeless tobacco use included snuff. He reports current alcohol use. He reports that he does not use drugs.  Allergies:  Allergies  Allergen Reactions   . No Known Allergies     (Not in a hospital admission)   No results found for this or any previous visit (from the past 48 hour(s)). No results found.  Review of Systems  HENT: Positive for hearing loss.   Musculoskeletal: Positive for joint pain.  All other systems reviewed and are negative.   There were no vitals taken for this visit. Physical Exam  Constitutional: He is oriented to person, place, and time. He appears well-developed and well-nourished. No distress.  HENT:  Head: Normocephalic.  Eyes: Pupils are equal, round, and reactive to light. EOM are normal.  Neck: Normal range of motion.  Cardiovascular: Normal rate and intact distal pulses.  Respiratory: Effort normal. No respiratory distress.  Musculoskeletal:     Left ankle: Tenderness.     Comments:   He has a mild hallux valgus, but relatively well maintained alignment.  He has significantly prominent osteophyte off of his distal metatarsal.  Neurovascularly intact.  Neurological: He is alert and oriented to person, place, and time.  Skin: Skin is warm and dry. No rash noted. No erythema.  Psychiatric: He has a normal mood and affect. His behavior is normal.     Assessment/Plan Left foot bone spur     Bone spur on the left foot.  I  discussed performing a cheilectomy with capsular repair and he would like to go forward with that.    Margart SicklesJoshua Kailly Richoux, PA-C 03/14/2018, 12:02 PM

## 2018-03-14 NOTE — Progress Notes (Signed)
Spoke with Britta Mccreedy in the Device Clinic at [facility]. She will give the pacemaker sheet to one on the nurses and ask them to  Fax it back. Asked her to please do it urgently since surgery is tomorrow.

## 2018-03-14 NOTE — Progress Notes (Signed)
Pts surgery moved from [facility] to [facility]. -03/15/18. Echo. EKG and cardiology clearance reviewed by Dr Glade Stanford. Pacemaker sheet faxed to Dr Elberta Fortis office. Spoke with Shanda Bumps in the office and explained that we need that back urgently.

## 2018-03-15 ENCOUNTER — Ambulatory Visit (HOSPITAL_BASED_OUTPATIENT_CLINIC_OR_DEPARTMENT_OTHER): Payer: Medicare Other | Admitting: Anesthesiology

## 2018-03-15 ENCOUNTER — Other Ambulatory Visit: Payer: Self-pay

## 2018-03-15 ENCOUNTER — Encounter (HOSPITAL_BASED_OUTPATIENT_CLINIC_OR_DEPARTMENT_OTHER)
Admission: RE | Disposition: A | Discharge status: Home / Self Care | Payer: Self-pay | Source: Home / Self Care | Attending: Orthopedic Surgery

## 2018-03-15 ENCOUNTER — Ambulatory Visit (HOSPITAL_BASED_OUTPATIENT_CLINIC_OR_DEPARTMENT_OTHER)
Admission: RE | Admit: 2018-03-15 | Discharge: 2018-03-15 | Disposition: A | Discharge status: Home / Self Care | Payer: Medicare Other | Attending: Orthopedic Surgery | Admitting: Orthopedic Surgery

## 2018-03-15 ENCOUNTER — Encounter (HOSPITAL_BASED_OUTPATIENT_CLINIC_OR_DEPARTMENT_OTHER): Payer: Self-pay | Admitting: Anesthesiology

## 2018-03-15 DIAGNOSIS — G4730 Sleep apnea, unspecified: Secondary | ICD-10-CM | POA: Insufficient documentation

## 2018-03-15 DIAGNOSIS — Z96651 Presence of right artificial knee joint: Secondary | ICD-10-CM | POA: Insufficient documentation

## 2018-03-15 DIAGNOSIS — Z87891 Personal history of nicotine dependence: Secondary | ICD-10-CM | POA: Diagnosis not present

## 2018-03-15 DIAGNOSIS — I442 Atrioventricular block, complete: Secondary | ICD-10-CM | POA: Diagnosis not present

## 2018-03-15 DIAGNOSIS — Z950 Presence of cardiac pacemaker: Secondary | ICD-10-CM | POA: Diagnosis not present

## 2018-03-15 DIAGNOSIS — E785 Hyperlipidemia, unspecified: Secondary | ICD-10-CM | POA: Diagnosis not present

## 2018-03-15 DIAGNOSIS — M2022 Hallux rigidus, left foot: Secondary | ICD-10-CM | POA: Insufficient documentation

## 2018-03-15 DIAGNOSIS — I10 Essential (primary) hypertension: Secondary | ICD-10-CM | POA: Insufficient documentation

## 2018-03-15 DIAGNOSIS — G4733 Obstructive sleep apnea (adult) (pediatric): Secondary | ICD-10-CM | POA: Insufficient documentation

## 2018-03-15 DIAGNOSIS — Z79899 Other long term (current) drug therapy: Secondary | ICD-10-CM | POA: Insufficient documentation

## 2018-03-15 HISTORY — PX: CHEILECTOMY: SHX1336

## 2018-03-15 SURGERY — CHEILECTOMY
Anesthesia: General | Site: Foot | Laterality: Left

## 2018-03-15 MED ORDER — ONDANSETRON HCL 4 MG/2ML IJ SOLN
INTRAMUSCULAR | Status: DC | PRN
  Administered 2018-03-15: 4 mg via INTRAVENOUS

## 2018-03-15 MED ORDER — FENTANYL CITRATE (PF) 100 MCG/2ML IJ SOLN
50.0000 ug | INTRAMUSCULAR | Status: DC | PRN
  Administered 2018-03-15: 100 ug via INTRAVENOUS

## 2018-03-15 MED ORDER — CEFAZOLIN SODIUM-DEXTROSE 2-4 GM/100ML-% IV SOLN
2.0000 g | INTRAVENOUS | Status: AC
  Administered 2018-03-15: 2 g via INTRAVENOUS

## 2018-03-15 MED ORDER — HYDROCODONE-ACETAMINOPHEN 5-325 MG PO TABS: 1.0000 | ORAL_TABLET | Freq: Four times a day (QID) | ORAL | 0 refills | Status: DC | PRN

## 2018-03-15 MED ORDER — MIDAZOLAM HCL 2 MG/2ML IJ SOLN
INTRAMUSCULAR | Status: AC
  Filled 2018-03-15: qty 2

## 2018-03-15 MED ORDER — CHLORHEXIDINE GLUCONATE 4 % EX LIQD: 60.0000 mL | Freq: Once | CUTANEOUS | Status: DC

## 2018-03-15 MED ORDER — SCOPOLAMINE 1 MG/3DAYS TD PT72: 1.0000 | MEDICATED_PATCH | Freq: Once | TRANSDERMAL | Status: DC | PRN

## 2018-03-15 MED ORDER — FENTANYL CITRATE (PF) 100 MCG/2ML IJ SOLN
INTRAMUSCULAR | Status: AC
  Filled 2018-03-15: qty 2

## 2018-03-15 MED ORDER — BUPIVACAINE HCL (PF) 0.25 % IJ SOLN
INTRAMUSCULAR | Status: DC | PRN
  Administered 2018-03-15: 6 mL

## 2018-03-15 MED ORDER — MIDAZOLAM HCL 2 MG/2ML IJ SOLN
1.0000 mg | INTRAMUSCULAR | Status: DC | PRN
  Administered 2018-03-15: 1 mg via INTRAVENOUS

## 2018-03-15 MED ORDER — LIDOCAINE HCL (CARDIAC) PF 100 MG/5ML IV SOSY
PREFILLED_SYRINGE | INTRAVENOUS | Status: DC | PRN
  Administered 2018-03-15: 50 mg via INTRAVENOUS

## 2018-03-15 MED ORDER — FENTANYL CITRATE (PF) 100 MCG/2ML IJ SOLN
25.0000 ug | INTRAMUSCULAR | Status: DC | PRN
  Administered 2018-03-15: 50 ug via INTRAVENOUS

## 2018-03-15 MED ORDER — DEXAMETHASONE SODIUM PHOSPHATE 10 MG/ML IJ SOLN
INTRAMUSCULAR | Status: DC | PRN
  Administered 2018-03-15: 10 mg via INTRAVENOUS

## 2018-03-15 MED ORDER — ONDANSETRON HCL 4 MG/2ML IJ SOLN: 4.0000 mg | Freq: Once | INTRAMUSCULAR | Status: DC | PRN

## 2018-03-15 MED ORDER — MEPERIDINE HCL 25 MG/ML IJ SOLN: 6.2500 mg | INTRAMUSCULAR | Status: DC | PRN

## 2018-03-15 MED ORDER — EPHEDRINE SULFATE 50 MG/ML IJ SOLN
INTRAMUSCULAR | Status: DC | PRN
  Administered 2018-03-15: 10 mg via INTRAVENOUS

## 2018-03-15 MED ORDER — PROPOFOL 10 MG/ML IV BOLUS
INTRAVENOUS | Status: DC | PRN
  Administered 2018-03-15: 200 mg via INTRAVENOUS

## 2018-03-15 MED ORDER — LACTATED RINGERS IV SOLN
INTRAVENOUS | Status: DC
  Administered 2018-03-15: 12:00:00 via INTRAVENOUS

## 2018-03-15 MED ORDER — SODIUM CHLORIDE 0.9 % IV SOLN: INTRAVENOUS | Status: DC

## 2018-03-15 MED ORDER — CEFAZOLIN SODIUM-DEXTROSE 2-4 GM/100ML-% IV SOLN
INTRAVENOUS | Status: AC
  Filled 2018-03-15: qty 100

## 2018-03-15 SURGICAL SUPPLY — 40 items
BANDAGE ACE 4X5 VEL STRL LF (GAUZE/BANDAGES/DRESSINGS) ×2 IMPLANT
BANDAGE ACE 6X5 VEL STRL LF (GAUZE/BANDAGES/DRESSINGS) IMPLANT
BANDAGE ESMARK 6X9 LF (GAUZE/BANDAGES/DRESSINGS) ×1 IMPLANT
BNDG COHESIVE 3X5 TAN STRL LF (GAUZE/BANDAGES/DRESSINGS) ×2 IMPLANT
BNDG ESMARK 6X9 LF (GAUZE/BANDAGES/DRESSINGS) ×2
BNDG GAUZE ELAST 4 BULKY (GAUZE/BANDAGES/DRESSINGS) IMPLANT
CLSR STERI-STRIP ANTIMIC 1/2X4 (GAUZE/BANDAGES/DRESSINGS) IMPLANT
COVER WAND RF STERILE (DRAPES) IMPLANT
CUFF TOURNIQUET SINGLE 24IN (TOURNIQUET CUFF) ×2 IMPLANT
CUFF TOURNIQUET SINGLE 34IN LL (TOURNIQUET CUFF) IMPLANT
DRAPE EXTREMITY T 121X128X90 (DRAPE) ×2 IMPLANT
DRAPE IMP U-DRAPE 54X76 (DRAPES) ×2 IMPLANT
DURAPREP 26ML APPLICATOR (WOUND CARE) ×2 IMPLANT
ELECT REM PT RETURN 9FT ADLT (ELECTROSURGICAL) ×2
ELECTRODE REM PT RTRN 9FT ADLT (ELECTROSURGICAL) ×1 IMPLANT
GAUZE SPONGE 4X4 12PLY STRL (GAUZE/BANDAGES/DRESSINGS) ×2 IMPLANT
GAUZE XEROFORM 1X8 LF (GAUZE/BANDAGES/DRESSINGS) IMPLANT
GLOVE BIO SURGEON STRL SZ7.5 (GLOVE) ×4 IMPLANT
GLOVE BIOGEL PI IND STRL 8 (GLOVE) ×2 IMPLANT
GLOVE BIOGEL PI INDICATOR 8 (GLOVE) ×2
GLOVE SURG SS PI 7.0 STRL IVOR (GLOVE) ×2 IMPLANT
GLOVE SURG SS PI 7.5 STRL IVOR (GLOVE) ×2 IMPLANT
GOWN STRL REUS W/ TWL LRG LVL3 (GOWN DISPOSABLE) ×2 IMPLANT
GOWN STRL REUS W/ TWL XL LVL3 (GOWN DISPOSABLE) IMPLANT
GOWN STRL REUS W/TWL LRG LVL3 (GOWN DISPOSABLE) ×2
GOWN STRL REUS W/TWL XL LVL3 (GOWN DISPOSABLE)
NS IRRIG 1000ML POUR BTL (IV SOLUTION) ×2 IMPLANT
PACK BASIN DAY SURGERY FS (CUSTOM PROCEDURE TRAY) ×2 IMPLANT
PENCIL BUTTON HOLSTER BLD 10FT (ELECTRODE) ×2 IMPLANT
SUCTION FRAZIER HANDLE 10FR (MISCELLANEOUS)
SUCTION TUBE FRAZIER 10FR DISP (MISCELLANEOUS) IMPLANT
SUT ETHILON 3 0 PS 1 (SUTURE) IMPLANT
SUT ETHILON 4 0 PS 2 18 (SUTURE) IMPLANT
SUT MON AB 2-0 CT1 36 (SUTURE) IMPLANT
SUT VIC AB 0 SH 27 (SUTURE) ×2 IMPLANT
SYR BULB 3OZ (MISCELLANEOUS) ×2 IMPLANT
TOWEL GREEN STERILE FF (TOWEL DISPOSABLE) ×2 IMPLANT
TOWEL OR NON WOVEN STRL DISP B (DISPOSABLE) ×2 IMPLANT
TUBE CONNECTING 20X1/4 (TUBING) IMPLANT
UNDERPAD 30X30 (UNDERPADS AND DIAPERS) ×2 IMPLANT

## 2018-03-15 NOTE — Anesthesia Procedure Notes (Signed)
Procedure Name: LMA Insertion Performed by: Lashia Niese W, CRNA Pre-anesthesia Checklist: Patient identified, Emergency Drugs available, Suction available and Patient being monitored Patient Re-evaluated:Patient Re-evaluated prior to induction Oxygen Delivery Method: Circle system utilized Preoxygenation: Pre-oxygenation with 100% oxygen Induction Type: IV induction Ventilation: Mask ventilation without difficulty LMA: LMA inserted LMA Size: 5.0 Number of attempts: 1 Placement Confirmation: positive ETCO2 Tube secured with: Tape Dental Injury: Teeth and Oropharynx as per pre-operative assessment        

## 2018-03-15 NOTE — Interval H&P Note (Signed)
I participated in the care of this patient and agree with the above history, physical and evaluation. I performed a review of the history and a physical exam as detailed   Montez Stryker Daniel Zebulan Hinshaw MD  

## 2018-03-15 NOTE — Anesthesia Preprocedure Evaluation (Signed)
Anesthesia Evaluation  Patient identified by MRN, date of birth, ID band Patient awake    Reviewed: Allergy & Precautions, NPO status , Patient's Chart, lab work & pertinent test results, reviewed documented beta blocker date and time   Airway Mallampati: II  TM Distance: >3 FB Neck ROM: Full    Dental  (+) Teeth Intact   Pulmonary sleep apnea and Continuous Positive Airway Pressure Ventilation , former smoker,    Pulmonary exam normal breath sounds clear to auscultation       Cardiovascular hypertension, Pt. on medications and Pt. on home beta blockers Normal cardiovascular exam+ dysrhythmias + pacemaker  Rhythm:Regular Rate:Normal  Hx/o CHB S/P PPM LBBB   Neuro/Psych negative neurological ROS  negative psych ROS   GI/Hepatic negative GI ROS, Neg liver ROS,   Endo/Other  Morbid obesityHyperlipidemia  Renal/GU negative Renal ROS     Musculoskeletal  (+) Arthritis , Osteoarthritis,  Hallux rigidis left great toe   Abdominal (+) + obese,   Peds  Hematology negative hematology ROS (+)   Anesthesia Other Findings   Reproductive/Obstetrics                             Anesthesia Physical Anesthesia Plan  ASA: III  Anesthesia Plan: General   Post-op Pain Management:    Induction: Intravenous  PONV Risk Score and Plan: 3 and Ondansetron, Dexamethasone and Treatment may vary due to age or medical condition  Airway Management Planned: LMA  Additional Equipment:   Intra-op Plan:   Post-operative Plan: Extubation in OR  Informed Consent: I have reviewed the patients History and Physical, chart, labs and discussed the procedure including the risks, benefits and alternatives for the proposed anesthesia with the patient or authorized representative who has indicated his/her understanding and acceptance.   Dental advisory given  Plan Discussed with: CRNA and Surgeon  Anesthesia Plan  Comments:         Anesthesia Quick Evaluation

## 2018-03-15 NOTE — Transfer of Care (Signed)
Immediate Anesthesia Transfer of Care Note  Patient: Harold Robertson  Procedure(s) Performed: CHEILECTOMY LEFT FOOT (Left Foot)  Patient Location: PACU  Anesthesia Type:General  Level of Consciousness: awake and sedated  Airway & Oxygen Therapy: Patient Spontanous Breathing and Patient connected to face mask oxygen  Post-op Assessment: Report given to RN and Post -op Vital signs reviewed and stable  Post vital signs: Reviewed and stable  Last Vitals:  Vitals Value Taken Time  BP 104/66 03/15/2018  3:12 PM  Temp    Pulse 59 03/15/2018  3:13 PM  Resp 17 03/15/2018  3:13 PM  SpO2 100 % 03/15/2018  3:13 PM  Vitals shown include unvalidated device data.  Last Pain:  Vitals:   03/15/18 1154  TempSrc: Oral  PainSc: 0-No pain         Complications: No apparent anesthesia complications

## 2018-03-15 NOTE — Anesthesia Postprocedure Evaluation (Signed)
Anesthesia Post Note  Patient: ZACHARYAH POHLER  Procedure(s) Performed: CHEILECTOMY LEFT FOOT (Left Foot)     Patient location during evaluation: PACU Anesthesia Type: General Level of consciousness: awake and alert Pain management: pain level controlled Vital Signs Assessment: post-procedure vital signs reviewed and stable Respiratory status: spontaneous breathing, nonlabored ventilation and respiratory function stable Cardiovascular status: blood pressure returned to baseline and stable Postop Assessment: no apparent nausea or vomiting Anesthetic complications: no    Last Vitals:  Vitals:   03/15/18 1545 03/15/18 1556  BP: 105/77   Pulse: (!) 59 63  Resp: 19 14  Temp:    SpO2: 100% 99%    Last Pain:  Vitals:   03/15/18 1556  TempSrc:   PainSc: 1     LLE Motor Response: Purposeful movement (03/15/18 1556) LLE Sensation: Pain (03/15/18 1556)          Menachem Urbanek A.

## 2018-03-15 NOTE — Discharge Instructions (Signed)
°  Post Anesthesia Home Care Instructions  Activity: Get plenty of rest for the remainder of the day. A responsible individual must stay with you for 24 hours following the procedure.  For the next 24 hours, DO NOT: -Drive a car -Advertising copywriter -Drink alcoholic beverages -Take any medication unless instructed by your physician -Make any legal decisions or sign important papers.  Meals: Start with liquid foods such as gelatin or soup. Progress to regular foods as tolerated. Avoid greasy, spicy, heavy foods. If nausea and/or vomiting occur, drink only clear liquids until the nausea and/or vomiting subsides. Call your physician if vomiting continues.  Special Instructions/Symptoms: Your throat may feel dry or sore from the anesthesia or the breathing tube placed in your throat during surgery. If this causes discomfort, gargle with warm salt water. The discomfort should disappear within 24 hours.  If you had a scopolamine patch placed behind your ear for the management of post- operative nausea and/or vomiting:  1. The medication in the patch is effective for 72 hours, after which it should be removed.  Wrap patch in a tissue and discard in the trash. Wash hands thoroughly with soap and water. 2. You may remove the patch earlier than 72 hours if you experience unpleasant side effects which may include dry mouth, dizziness or visual disturbances. 3. Avoid touching the patch. Wash your hands with soap and water after contact with the patch.      Ok to bear weight in post op show  Keep dressing C/D/I.        Post Anesthesia Home Care Instructions  Activity: Get plenty of rest for the remainder of the day. A responsible individual must stay with you for 24 hours following the procedure.  For the next 24 hours, DO NOT: -Drive a car -Advertising copywriter -Drink alcoholic beverages -Take any medication unless instructed by your physician -Make any legal decisions or sign important  papers.  Meals: Start with liquid foods such as gelatin or soup. Progress to regular foods as tolerated. Avoid greasy, spicy, heavy foods. If nausea and/or vomiting occur, drink only clear liquids until the nausea and/or vomiting subsides. Call your physician if vomiting continues.  Special Instructions/Symptoms: Your throat may feel dry or sore from the anesthesia or the breathing tube placed in your throat during surgery. If this causes discomfort, gargle with warm salt water. The discomfort should disappear within 24 hours.  If you had a scopolamine patch placed behind your ear for the management of post- operative nausea and/or vomiting:  1. The medication in the patch is effective for 72 hours, after which it should be removed.  Wrap patch in a tissue and discard in the trash. Wash hands thoroughly with soap and water. 2. You may remove the patch earlier than 72 hours if you experience unpleasant side effects which may include dry mouth, dizziness or visual disturbances. 3. Avoid touching the patch. Wash your hands with soap and water after contact with the patch.

## 2018-03-15 NOTE — Op Note (Signed)
03/15/2018  2:22 PM  PATIENT:  Harold Robertson    PRE-OPERATIVE DIAGNOSIS:  HALLUX RIGIDUS  POST-OPERATIVE DIAGNOSIS:  Same  PROCEDURE:  CHEILECTOMY LEFT FOOT  SURGEON:  Sheral Apley, MD  ASSISTANT: none  ANESTHESIA:   gen  PREOPERATIVE INDICATIONS:  Harold Robertson is a  67 y.o. male with a diagnosis of HALLUX RIGIDUS who failed conservative measures and elected for surgical management.    The risks benefits and alternatives were discussed with the patient preoperatively including but not limited to the risks of infection, bleeding, nerve injury, cardiopulmonary complications, the need for revision surgery, among others, and the patient was willing to proceed.  OPERATIVE IMPLANTS: none  OPERATIVE FINDINGS: prominent spur  BLOOD LOSS: min  COMPLICATIONS: none  TOURNIQUET TIME:  OPERATIVE PROCEDURE:  Patient was identified in the preoperative holding area and site was marked by me He was transported to the operating theater and placed on the table in supine position taking care to pad all bony prominences. After a preincinduction time out anesthesia was induced. The left lower extremity was prepped and draped in normal sterile fashion and a pre-incision timeout was performed. He received ancef for preoperative antibiotics.   Made a dorsal lateral dorsomedial incision over his prominent osteophyte.  I protected his dorsal sensory nerve and dissected down to his periosteum I then incised this longitudinally as well and exposed the prominent bone.  I then used a ACL saw to resect this.  I used fluoroscopy to to confirm appropriate resection and remaining bone I then thoroughly irrigated his MP joint.  I repaired his soft tissue again protected his sensory nerve.  I then closed the skin in layers.  Sterile dressing was applied he was awoken taken to PACU in stable condition  POST OPERATIVE PLAN: Weightbearing as tolerated in a hard soled shoe mobilize and aspirin for DVT  prophylaxis

## 2018-03-18 ENCOUNTER — Encounter (HOSPITAL_BASED_OUTPATIENT_CLINIC_OR_DEPARTMENT_OTHER): Payer: Self-pay | Admitting: Orthopedic Surgery

## 2018-03-25 DIAGNOSIS — M2022 Hallux rigidus, left foot: Secondary | ICD-10-CM | POA: Diagnosis not present

## 2018-04-17 DIAGNOSIS — M2022 Hallux rigidus, left foot: Secondary | ICD-10-CM | POA: Diagnosis not present

## 2018-04-17 DIAGNOSIS — Z6841 Body Mass Index (BMI) 40.0 and over, adult: Secondary | ICD-10-CM | POA: Diagnosis not present

## 2018-04-18 LAB — CUP PACEART REMOTE DEVICE CHECK
Battery Remaining Longevity: 70 mo
Brady Statistic AP VP Percent: 88 %
Brady Statistic AP VS Percent: 1 %
Brady Statistic AS VP Percent: 12 %
Brady Statistic AS VS Percent: 1 %
Brady Statistic RA Percent Paced: 88 %
Date Time Interrogation Session: 20191203114944
Implantable Lead Implant Date: 20170615
Implantable Lead Implant Date: 20170615
Implantable Lead Implant Date: 20170615
Implantable Lead Location: 753858
Implantable Lead Location: 753859
Implantable Lead Location: 753860
Implantable Pulse Generator Implant Date: 20170615
Lead Channel Impedance Value: 1025 Ohm
Lead Channel Impedance Value: 440 Ohm
Lead Channel Impedance Value: 460 Ohm
Lead Channel Pacing Threshold Amplitude: 0.875 V
Lead Channel Pacing Threshold Amplitude: 2.875 V
Lead Channel Pacing Threshold Pulse Width: 0.4 ms
Lead Channel Pacing Threshold Pulse Width: 0.4 ms
Lead Channel Pacing Threshold Pulse Width: 0.4 ms
Lead Channel Sensing Intrinsic Amplitude: 12 mV
Lead Channel Sensing Intrinsic Amplitude: 3.6 mV
Lead Channel Setting Pacing Amplitude: 2 V
Lead Channel Setting Pacing Amplitude: 2 V
Lead Channel Setting Pacing Amplitude: 3.875
Lead Channel Setting Pacing Pulse Width: 0.4 ms
Lead Channel Setting Pacing Pulse Width: 0.4 ms
Lead Channel Setting Sensing Sensitivity: 5 mV
MDC IDC MSMT BATTERY REMAINING PERCENTAGE: 95.5 %
MDC IDC MSMT BATTERY VOLTAGE: 2.96 V
MDC IDC MSMT LEADCHNL RA PACING THRESHOLD AMPLITUDE: 0.75 V
Pulse Gen Model: 3262
Pulse Gen Serial Number: 7894586

## 2018-05-06 ENCOUNTER — Ambulatory Visit (INDEPENDENT_AMBULATORY_CARE_PROVIDER_SITE_OTHER): Payer: Medicare Other | Admitting: Family Medicine

## 2018-05-06 ENCOUNTER — Encounter: Payer: Self-pay | Admitting: Family Medicine

## 2018-05-06 VITALS — BP 118/78 | HR 78 | Temp 98.7°F | Resp 18 | Ht 71.0 in | Wt 301.0 lb

## 2018-05-06 DIAGNOSIS — I5020 Unspecified systolic (congestive) heart failure: Secondary | ICD-10-CM | POA: Diagnosis not present

## 2018-05-06 DIAGNOSIS — Z0000 Encounter for general adult medical examination without abnormal findings: Secondary | ICD-10-CM

## 2018-05-06 DIAGNOSIS — E7800 Pure hypercholesterolemia, unspecified: Secondary | ICD-10-CM | POA: Diagnosis not present

## 2018-05-06 DIAGNOSIS — I10 Essential (primary) hypertension: Secondary | ICD-10-CM

## 2018-05-06 DIAGNOSIS — Z950 Presence of cardiac pacemaker: Secondary | ICD-10-CM

## 2018-05-06 NOTE — Progress Notes (Signed)
Subjective:    Patient ID: Harold Robertson, male    DOB: Feb 25, 1951, 68 y.o.   MRN: [id number]  HPI  01/28/18 Patient is here today for preoperative surgical clearance.  He is scheduled to have an elective surgery performed on his left foot.  History is significant for atrioventricular block, complete heart block, requiring pacemaker placement.  Apparently he was hospitalized with a heart rate in the 20s per his report requiring pacemaker placement.  At that time in 2017, his ejection fraction was 40 to 45% on echocardiogram.  He is done well since his pacemaker has been placed and follows up regularly with his EP specialist Dr. Elberta Fortis.  He denies any syncope.  He denies any bradycardia.  He denies any chest pain or shortness of breath.  He denies any orthopnea or paroxysmal nocturnal dyspnea.  However he has not had a repeat echocardiogram since the placement of his pacemaker in 2017.  He denies any angina.  He denies any melena.  He denies any hematochezia.  His colonoscopy is up-to-date however he is due for prostate cancer screening.  He is also due for Pneumovax 23.  He has had his flu shot.  At that time, my plan was: There are no contraindications for the patient to proceed with surgery from a medical standpoint.  I will check a CBC to rule out anemia.  I will also check a CMP to rule out any electrolyte disturbances or renal issues.  While checking lab work I will screen the patient for prostate cancer with a PSA.  I will also check his cholesterol with a fasting lipid panel.  I do not see a repeat echocardiogram since 2017.  At that time his ejection fraction was 40 to 45%.  However this was done prior to him receiving his pacemaker at that time he had diffuse hypokinesis.  By symptoms, the patient is class I.  He denies any shortness of breath with activity.  Therefore I will repeat an echocardiogram.  If the patient still has diminished ejection fraction, I would recommend follow-up with his  cardiologist Dr. Duke Salvia as well as optimizing his medical therapy if necessary.  Patient received Pneumovax 23 today.  05/06/18 Echo revealed: Left ventricle: Abnormal septal motion. The cavity size was   normal. Wall thickness was increased in a pattern of mild LVH.   Systolic function was normal. The estimated ejection fraction was   in the range of 55% to 60%. Wall motion was normal; there were no   regional wall motion abnormalities. Doppler parameters are   consistent with abnormal left ventricular relaxation (grade 1   diastolic dysfunction).  Labs in November included normal PSA, CMP, CBC, and lipid panel.  Had colonoscopy in 2016 which revealed 2 polyps and repeat colonoscopy recommended in 5 years.  Patient is here today for complete physical exam.  He has no concerns.  Patient received Pneumovax 23 in November.  He is due for Prevnar 13 after May.  The remainder of his immunizations are up-to-date as evidenced below: Immunization History  Administered Date(s) Administered  . Influenza Split 01/31/2013, 03/17/2015  . Influenza, High Dose Seasonal PF 12/18/2017  . Influenza,inj,Quad PF,6+ Mos 01/14/2016, 11/16/2016  . Influenza-Unspecified 01/02/2014  . Pneumococcal Polysaccharide-23 01/28/2018  . Tdap 01/14/2016, 05/11/2016   He is due for the shingles vaccine.  We discussed this today and I recommended checking on this at a local [facility] or [facility].  Do not stock that vaccine here.  His cancer screening  is up-to-date with a normal PSA in November and a colonoscopy that is not due again until 2021.  He denies a 30-pack-year history of smoking and therefore does not require lung cancer screening.  He does not smoke and therefore does not require evaluation for AAA.  BMI is elevated at 42.  He is not exercising.  He is sedentary.  He admits that he has become more sedentary after his recent foot surgery.  The remainder of his review of systems is negative.  He denies any issues with falls  or depressions.  He denies any troubles with his ADLs on his screening questionnaire.  He denies any issues with memory or falls.  He denies any problems with depression. Past Medical History:  Diagnosis Date  . Arthritis   . Complete heart block (HCC)   . Hypertension   . OSA on CPAP   . Presence of permanent cardiac pacemaker    Past Surgical History:  Procedure Laterality Date  . APPENDECTOMY  1960s  . CARDIAC CATHETERIZATION Right 09/08/2015   Procedure: Temporary Pacemaker;  Surgeon: Will Jorja Loa, MD;  Location: [facility] INVASIVE CV LAB;  Service: Cardiovascular;  Laterality: Right;  . CARDIAC CATHETERIZATION N/A 09/09/2015   Procedure: Left Heart Cath and Coronary Angiography;  Surgeon: Peter M Swaziland, MD;  Location: [facility] INVASIVE CV LAB;  Service: Cardiovascular;  Laterality: N/A;  . CHEILECTOMY Left 03/15/2018   Procedure: CHEILECTOMY LEFT FOOT;  Surgeon: Sheral Apley, MD;  Location: [facility];  Service: Orthopedics;  Laterality: Left;  . COLONOSCOPY    . EP IMPLANTABLE DEVICE N/A 09/09/2015   Procedure: BiV Pacemaker Insertion CRT-P;  Surgeon: Will Jorja Loa, MD;  Location: [facility] INVASIVE CV LAB;  Service: Cardiovascular;  Laterality: N/A;  . KNEE SURGERY Right   . TOTAL KNEE ARTHROPLASTY Right 07/11/2016  . TOTAL KNEE ARTHROPLASTY Right 07/11/2016   Procedure: TOTAL KNEE ARTHROPLASTY;  Surgeon: Sheral Apley, MD;  Location: [facility] OR;  Service: Orthopedics;  Laterality: Right;   Current Outpatient Medications on File Prior to Visit  Medication Sig Dispense Refill  . aspirin 81 MG tablet Take 81 mg by mouth daily.    Marland Kitchen atorvastatin (LIPITOR) 40 MG tablet TAKE 1 TABLET BY MOUTH EVERY DAY AT 6PM 90 tablet 2  . carvedilol (COREG) 12.5 MG tablet TAKE 1 TABLET BY MOUTH TWICE A DAY 180 tablet 2  . HYDROcodone-acetaminophen (NORCO) 5-325 MG tablet Take 1-2 tablets by mouth every 6 (six) hours as needed for moderate pain. 15 tablet 0  . lisinopril (PRINIVIL,ZESTRIL)  40 MG tablet TAKE 1 TABLET BY MOUTH EVERY DAY 90 tablet 1   No current facility-administered medications on file prior to visit.    No Known Allergies Social History   Socioeconomic History  . Marital status: Married    Spouse name: Not on file  . Number of children: Not on file  . Years of education: Not on file  . Highest education level: Not on file  Occupational History  . Occupation: truck Public librarian Needs  . Financial resource strain: Not on file  . Food insecurity:    Worry: Not on file    Inability: Not on file  . Transportation needs:    Medical: Not on file    Non-medical: Not on file  Tobacco Use  . Smoking status: Former Smoker    Years: 6.00    Types: Cigars  . Smokeless tobacco: Former Neurosurgeon    Types: Snuff  Substance and Sexual  Activity  . Alcohol use: Yes    Comment: occ  . Drug use: No  . Sexual activity: Yes  Lifestyle  . Physical activity:    Days per week: Not on file    Minutes per session: Not on file  . Stress: Not on file  Relationships  . Social connections:    Talks on phone: Not on file    Gets together: Not on file    Attends religious service: Not on file    Active member of club or organization: Not on file    Attends meetings of clubs or organizations: Not on file    Relationship status: Not on file  . Intimate partner violence:    Fear of current or ex partner: Not on file    Emotionally abused: Not on file    Physically abused: Not on file    Forced sexual activity: Not on file  Other Topics Concern  . Not on file  Social History Narrative   Marital status: Married x 30 years; first marriage     Children: 2 children; no grandchildren     Lives: lives with wife      Employment: truck driver x 14 years; [place]        Tobacco:  None; pipes and cigars; chews tobacco.       Alcohol:  Weekends.      Drugs:  None      Exercise:  Rarely.      Seatbelt:  100%      Guns:  Loaded and secured.   Family History  Problem  Relation Age of Onset  . Heart disease Father [id number]       AMI  . Liver cancer Mother   . Cancer Mother [id number]       Colon cancer with liver mets  . Heart disease Brother 50       AMI  . Heart disease Maternal Grandmother       Review of Systems  All other systems reviewed and are negative.      Objective:   Physical Exam Vitals signs reviewed.  Constitutional:      General: He is not in acute distress.    Appearance: Normal appearance. He is well-developed. He is obese. He is not ill-appearing, toxic-appearing or diaphoretic.  HENT:     Head: Normocephalic and atraumatic.     Right Ear: Tympanic membrane, ear canal and external ear normal.     Left Ear: Tympanic membrane, ear canal and external ear normal.     Nose: Nose normal. No congestion or rhinorrhea.     Mouth/Throat:     Pharynx: Oropharynx is clear. No oropharyngeal exudate or posterior oropharyngeal erythema.  Eyes:     General: No scleral icterus.       Right eye: No discharge.        Left eye: No discharge.     Extraocular Movements: Extraocular movements intact.     Conjunctiva/sclera: Conjunctivae normal.     Pupils: Pupils are equal, round, and reactive to light.  Neck:     Musculoskeletal: Normal range of motion and neck supple. No neck rigidity or muscular tenderness.     Thyroid: No thyromegaly.     Vascular: No carotid bruit.  Cardiovascular:     Rate and Rhythm: Normal rate and regular rhythm.     Pulses: Normal pulses.     Heart sounds: Normal heart sounds. No murmur. No friction rub. No gallop.   Pulmonary:  Effort: Pulmonary effort is normal. No respiratory distress.     Breath sounds: Normal breath sounds. No stridor. No wheezing, rhonchi or rales.  Chest:     Chest wall: No tenderness.  Abdominal:     General: Bowel sounds are normal. There is no distension.     Palpations: Abdomen is soft. There is no mass.     Tenderness: There is no abdominal tenderness. There is no guarding or rebound.      Hernia: No hernia is present.  Musculoskeletal:        General: No deformity.     Right lower leg: No edema.     Left lower leg: No edema.  Lymphadenopathy:     Cervical: No cervical adenopathy.  Skin:    Coloration: Skin is not jaundiced.     Findings: No bruising, erythema, lesion or rash.  Neurological:     General: No focal deficit present.     Mental Status: He is alert and oriented to person, place, and time.     Cranial Nerves: No cranial nerve deficit.     Sensory: No sensory deficit.     Motor: No weakness.     Coordination: Coordination normal.     Gait: Gait normal.     Deep Tendon Reflexes: Reflexes normal.  Psychiatric:        Mood and Affect: Mood normal.        Behavior: Behavior normal.        Thought Content: Thought content normal.        Judgment: Judgment normal.           Assessment & Plan:  General medical exam  Systolic congestive heart failure, NYHA class 1, unspecified congestive heart failure chronicity (HCC)  Essential hypertension  Pure hypercholesterolemia  Presence of permanent cardiac pacemaker  Physical exam today is significant for obesity as well as low HDL.  I have recommended 30 minutes a day 5 days a week of aerobic exercise.  I recommended that he gradually build up to this slowly.  I recommended low impact aerobic exercise such as swimming to avoid joint irritation.  I like to see the patient try to get down to 250 pounds gradually over the next 6 to 12 months.  I would reassess the patient in 6 months for repeat fasting lab work.  At that time we can give him Prevnar 13.  He also has a slightly atypical mole in the center of his back roughly at the level of T12.  This has somewhat irregular borders.  This is something that I have recommended we recheck at his next visit.  If growing or changing we can biopsy it at this time.  He denies any problems with falls, depression, memory loss.  Blood pressure is excellent.  Lab work in  November was outstanding.

## 2018-05-28 ENCOUNTER — Ambulatory Visit (INDEPENDENT_AMBULATORY_CARE_PROVIDER_SITE_OTHER): Payer: Medicare Other | Admitting: *Deleted

## 2018-05-28 DIAGNOSIS — I442 Atrioventricular block, complete: Secondary | ICD-10-CM | POA: Diagnosis not present

## 2018-05-28 DIAGNOSIS — I429 Cardiomyopathy, unspecified: Secondary | ICD-10-CM

## 2018-05-28 LAB — CUP PACEART REMOTE DEVICE CHECK
Battery Remaining Longevity: 70 mo
Battery Remaining Percentage: 95.5 %
Battery Voltage: 2.96 V
Brady Statistic AP VP Percent: 84 %
Brady Statistic AP VS Percent: 1 %
Brady Statistic AS VP Percent: 16 %
Brady Statistic AS VS Percent: 1 %
Brady Statistic RA Percent Paced: 84 %
Date Time Interrogation Session: 20200303120015
Implantable Lead Implant Date: 20170615
Implantable Lead Implant Date: 20170615
Implantable Lead Implant Date: 20170615
Implantable Lead Location: 753858
Implantable Lead Location: 753859
Implantable Lead Location: 753860
Implantable Pulse Generator Implant Date: 20170615
Lead Channel Impedance Value: 1025 Ohm
Lead Channel Impedance Value: 440 Ohm
Lead Channel Impedance Value: 460 Ohm
Lead Channel Pacing Threshold Amplitude: 0.75 V
Lead Channel Pacing Threshold Pulse Width: 0.4 ms
Lead Channel Pacing Threshold Pulse Width: 0.4 ms
Lead Channel Pacing Threshold Pulse Width: 0.4 ms
Lead Channel Sensing Intrinsic Amplitude: 5 mV
Lead Channel Setting Pacing Amplitude: 2 V
Lead Channel Setting Pacing Amplitude: 2 V
Lead Channel Setting Pacing Amplitude: 4.25 V
Lead Channel Setting Pacing Pulse Width: 0.4 ms
Lead Channel Setting Pacing Pulse Width: 0.4 ms
MDC IDC MSMT LEADCHNL LV PACING THRESHOLD AMPLITUDE: 3.25 V
MDC IDC MSMT LEADCHNL RV PACING THRESHOLD AMPLITUDE: 1 V
MDC IDC MSMT LEADCHNL RV SENSING INTR AMPL: 12 mV
MDC IDC SET LEADCHNL RV SENSING SENSITIVITY: 5 mV
Pulse Gen Model: 3262
Pulse Gen Serial Number: 7894586

## 2018-06-04 ENCOUNTER — Telehealth: Payer: Self-pay | Admitting: *Deleted

## 2018-06-04 NOTE — Telephone Encounter (Signed)
-----   Message from Will Jorja Loa, MD sent at 06/03/2018  3:57 PM EDT ----- Normal remote reviewed. Battery and lead parameters stable.  LV lead threshold increasing over the last few months.  Will need a device visit for further diagnostic testing.

## 2018-06-04 NOTE — Telephone Encounter (Signed)
LMOVM requesting call back to the DC. Will schedule DC appointment on a day Dr. Elberta Fortis is in the office to reassess LV threshold.

## 2018-06-04 NOTE — Telephone Encounter (Signed)
Spoke with patient. He is agreeable to a Device Clinic appointment on 06/18/18 at 11:00am. Pt agrees to call with any questions or concerns.

## 2018-06-05 NOTE — Progress Notes (Signed)
Remote pacemaker transmission.   

## 2018-06-30 ENCOUNTER — Telehealth: Payer: Self-pay | Admitting: *Deleted

## 2018-06-30 NOTE — Telephone Encounter (Signed)
Spoke with patient to reschedule DC appointment from 4/6 to 4/13 at 11:00am due to provider illness. Pt verbalizes understanding of date/time change and denies questions at this time.

## 2018-07-08 ENCOUNTER — Ambulatory Visit (INDEPENDENT_AMBULATORY_CARE_PROVIDER_SITE_OTHER): Payer: Medicare Other | Admitting: *Deleted

## 2018-07-08 ENCOUNTER — Other Ambulatory Visit: Payer: Self-pay

## 2018-07-08 DIAGNOSIS — Z950 Presence of cardiac pacemaker: Secondary | ICD-10-CM | POA: Diagnosis not present

## 2018-07-08 DIAGNOSIS — I442 Atrioventricular block, complete: Secondary | ICD-10-CM | POA: Diagnosis not present

## 2018-07-09 LAB — CUP PACEART INCLINIC DEVICE CHECK
Battery Remaining Longevity: 54 mo
Battery Voltage: 2.96 V
Brady Statistic RA Percent Paced: 87 %
Brady Statistic RV Percent Paced: 99.93 %
Date Time Interrogation Session: 20200413144115
Implantable Lead Implant Date: 20170615
Implantable Lead Implant Date: 20170615
Implantable Lead Implant Date: 20170615
Implantable Lead Location: 753858
Implantable Lead Location: 753859
Implantable Lead Location: 753860
Implantable Pulse Generator Implant Date: 20170615
Lead Channel Impedance Value: 475 Ohm
Lead Channel Impedance Value: 475 Ohm
Lead Channel Impedance Value: 975 Ohm
Lead Channel Pacing Threshold Amplitude: 0.75 V
Lead Channel Pacing Threshold Amplitude: 0.875 V
Lead Channel Pacing Threshold Amplitude: 1.75 V
Lead Channel Pacing Threshold Pulse Width: 0.4 ms
Lead Channel Pacing Threshold Pulse Width: 0.4 ms
Lead Channel Pacing Threshold Pulse Width: 0.8 ms
Lead Channel Sensing Intrinsic Amplitude: 5 mV
Lead Channel Setting Pacing Amplitude: 2 V
Lead Channel Setting Pacing Amplitude: 2 V
Lead Channel Setting Pacing Amplitude: 2.875
Lead Channel Setting Pacing Pulse Width: 0.4 ms
Lead Channel Setting Pacing Pulse Width: 0.8 ms
Lead Channel Setting Sensing Sensitivity: 5 mV
Pulse Gen Model: 3262
Pulse Gen Serial Number: 7894586

## 2018-07-09 NOTE — Progress Notes (Signed)
CRT-P device check in clinic due to LV threshold trending up per 05/28/18 remote transmission. RA and RV sensing, impedances, and thresholds consistent with previous measurements. LV impedance stable. LV1 threshold (M3-P4) measures 2.5V @ 0.16ms and 1.75V @ 0.61ms. Maintained current vector and reprogrammed LV PW to 0.78ms with LV1 auto capture on (+1.0V safety margin), will consider testing other vectors if pacing threshold continues to rise. Histograms appropriate for patient and level of activity. No mode switches or ventricular high rate episodes. Patient bi-ventricularly pacing >99% of the time. Device programmed with appropriate safety margins. Device heart failure diagnostics are stable. Estimated longevity 4.5-5.4 years. Patient enrolled in remote follow-up. Merlin on 08/27/18 and ROV with WC in 01/2019.

## 2018-07-29 ENCOUNTER — Other Ambulatory Visit: Payer: Self-pay | Admitting: Cardiology

## 2018-07-29 MED ORDER — CARVEDILOL 12.5 MG PO TABS: 12.5000 mg | ORAL_TABLET | Freq: Two times a day (BID) | ORAL | 1 refills | Status: DC

## 2018-07-29 MED ORDER — LISINOPRIL 40 MG PO TABS: 40.0000 mg | ORAL_TABLET | Freq: Every day | ORAL | 1 refills | Status: DC

## 2018-07-29 NOTE — Addendum Note (Signed)
Addended by: Demetrios Loll on: 07/29/2018 09:36 AM   Modules accepted: Orders

## 2018-08-27 ENCOUNTER — Ambulatory Visit (INDEPENDENT_AMBULATORY_CARE_PROVIDER_SITE_OTHER): Payer: Medicare Other | Admitting: *Deleted

## 2018-08-27 DIAGNOSIS — I442 Atrioventricular block, complete: Secondary | ICD-10-CM | POA: Diagnosis not present

## 2018-08-28 LAB — CUP PACEART REMOTE DEVICE CHECK
Battery Remaining Longevity: 62 mo
Battery Remaining Percentage: 95.5 %
Battery Voltage: 2.96 V
Brady Statistic AP VP Percent: 92 %
Brady Statistic AP VS Percent: 1 %
Brady Statistic AS VP Percent: 7.8 %
Brady Statistic AS VS Percent: 1 %
Brady Statistic RA Percent Paced: 92 %
Date Time Interrogation Session: 20200602103315
Implantable Lead Implant Date: 20170615
Implantable Lead Implant Date: 20170615
Implantable Lead Implant Date: 20170615
Implantable Lead Location: 753858
Implantable Lead Location: 753859
Implantable Lead Location: 753860
Implantable Pulse Generator Implant Date: 20170615
Lead Channel Impedance Value: 430 Ohm
Lead Channel Impedance Value: 440 Ohm
Lead Channel Impedance Value: 910 Ohm
Lead Channel Pacing Threshold Amplitude: 0.75 V
Lead Channel Pacing Threshold Amplitude: 1 V
Lead Channel Pacing Threshold Amplitude: 2.125 V
Lead Channel Pacing Threshold Pulse Width: 0.4 ms
Lead Channel Pacing Threshold Pulse Width: 0.4 ms
Lead Channel Pacing Threshold Pulse Width: 0.8 ms
Lead Channel Sensing Intrinsic Amplitude: 12 mV
Lead Channel Sensing Intrinsic Amplitude: 5 mV
Lead Channel Setting Pacing Amplitude: 2 V
Lead Channel Setting Pacing Amplitude: 2 V
Lead Channel Setting Pacing Amplitude: 3.125
Lead Channel Setting Pacing Pulse Width: 0.4 ms
Lead Channel Setting Pacing Pulse Width: 0.8 ms
Lead Channel Setting Sensing Sensitivity: 5 mV
Pulse Gen Model: 3262
Pulse Gen Serial Number: 7894586

## 2018-09-02 ENCOUNTER — Other Ambulatory Visit: Payer: Self-pay | Admitting: Cardiology

## 2018-09-02 MED ORDER — ATORVASTATIN CALCIUM 40 MG PO TABS: ORAL_TABLET | ORAL | 1 refills | Status: DC

## 2018-09-04 ENCOUNTER — Encounter: Payer: Self-pay | Admitting: Cardiology

## 2018-09-04 NOTE — Progress Notes (Signed)
Remote pacemaker transmission.   

## 2018-11-01 ENCOUNTER — Other Ambulatory Visit: Payer: Self-pay

## 2018-11-04 ENCOUNTER — Encounter: Payer: Self-pay | Admitting: Family Medicine

## 2018-11-04 ENCOUNTER — Other Ambulatory Visit: Payer: Self-pay

## 2018-11-04 ENCOUNTER — Ambulatory Visit (INDEPENDENT_AMBULATORY_CARE_PROVIDER_SITE_OTHER): Payer: Medicare Other | Admitting: Family Medicine

## 2018-11-04 VITALS — BP 110/70 | HR 78 | Temp 97.6°F | Resp 16 | Ht 71.0 in | Wt 292.0 lb

## 2018-11-04 DIAGNOSIS — Z23 Encounter for immunization: Secondary | ICD-10-CM

## 2018-11-04 DIAGNOSIS — Z950 Presence of cardiac pacemaker: Secondary | ICD-10-CM

## 2018-11-04 DIAGNOSIS — E7800 Pure hypercholesterolemia, unspecified: Secondary | ICD-10-CM | POA: Diagnosis not present

## 2018-11-04 DIAGNOSIS — I10 Essential (primary) hypertension: Secondary | ICD-10-CM | POA: Diagnosis not present

## 2018-11-04 NOTE — Addendum Note (Signed)
Addended by: Shary Decamp B on: 11/04/2018 10:15 AM   Modules accepted: Orders

## 2018-11-04 NOTE — Progress Notes (Signed)
Subjective:    Patient ID: Harold Robertson, male    DOB: 08/20/1950, 68 y.o.   MRN: [id number]  HPI  01/28/18 Patient is here today for preoperative surgical clearance.  He is scheduled to have an elective surgery performed on his left foot.  History is significant for atrioventricular block, complete heart block, requiring pacemaker placement.  Apparently he was hospitalized with a heart rate in the 20s per his report requiring pacemaker placement.  At that time in 2017, his ejection fraction was 40 to 45% on echocardiogram.  He is done well since his pacemaker has been placed and follows up regularly with his EP specialist Dr. Elberta Fortisamnitz.  He denies any syncope.  He denies any bradycardia.  He denies any chest pain or shortness of breath.  He denies any orthopnea or paroxysmal nocturnal dyspnea.  However he has not had a repeat echocardiogram since the placement of his pacemaker in 2017.  He denies any angina.  He denies any melena.  He denies any hematochezia.  His colonoscopy is up-to-date however he is due for prostate cancer screening.  He is also due for Pneumovax 23.  He has had his flu shot.  At that time, my plan was: There are no contraindications for the patient to proceed with surgery from a medical standpoint.  I will check a CBC to rule out anemia.  I will also check a CMP to rule out any electrolyte disturbances or renal issues.  While checking lab work I will screen the patient for prostate cancer with a PSA.  I will also check his cholesterol with a fasting lipid panel.  I do not see a repeat echocardiogram since 2017.  At that time his ejection fraction was 40 to 45%.  However this was done prior to him receiving his pacemaker at that time he had diffuse hypokinesis.  By symptoms, the patient is class I.  He denies any shortness of breath with activity.  Therefore I will repeat an echocardiogram.  If the patient still has diminished ejection fraction, I would recommend follow-up with his  cardiologist Dr. Duke Salviaandolph as well as optimizing his medical therapy if necessary.  Patient received Pneumovax 23 today.  05/06/18 Echo revealed: Left ventricle: Abnormal septal motion. The cavity size was   normal. Wall thickness was increased in a pattern of mild LVH.   Systolic function was normal. The estimated ejection fraction was   in the range of 55% to 60%. Wall motion was normal; there were no   regional wall motion abnormalities. Doppler parameters are   consistent with abnormal left ventricular relaxation (grade 1   diastolic dysfunction).  Labs in November included normal PSA, CMP, CBC, and lipid panel.  Had colonoscopy in 2016 which revealed 2 polyps and repeat colonoscopy recommended in 5 years.  Patient is here today for complete physical exam.  He has no concerns.  Patient received Pneumovax 23 in November.  He is due for Prevnar 13 after May.  He is due for the shingles vaccine.  We discussed this today and I recommended checking on this at a local [facility] or [facility].  Do not stock that vaccine here.  His cancer screening is up-to-date with a normal PSA in November and a colonoscopy that is not due again until 2021.  He denies a 30-pack-year history of smoking and therefore does not require lung cancer screening.  He does not smoke and therefore does not require evaluation for AAA.  BMI is elevated at 42.  He is not exercising.  He is sedentary.  He admits that he has become more sedentary after his recent foot surgery.  The remainder of his review of systems is negative.  He denies any issues with falls or depressions.  He denies any troubles with his ADLs on his screening questionnaire.  He denies any issues with memory or falls.  He denies any problems with depression.  At that time, my plan was: Physical exam today is significant for obesity as well as low HDL.  I have recommended 30 minutes a day 5 days a week of aerobic exercise.  I recommended that he gradually build up to this  slowly.  I recommended low impact aerobic exercise such as swimming to avoid joint irritation.  I like to see the patient try to get down to 250 pounds gradually over the next 6 to 12 months.  I would reassess the patient in 6 months for repeat fasting lab work.  At that time we can give him Prevnar 13.  He also has a slightly atypical mole in the center of his back roughly at the level of T12.  This has somewhat irregular borders.  This is something that I have recommended we recheck at his next visit.  If growing or changing we can biopsy it at this time.  He denies any problems with falls, depression, memory loss.  Blood pressure is excellent.  Lab work in November was outstanding.  11/04/18 Patient is here today for a follow-up.  He is due for Prevnar 13 today.  He states that his blood pressure has been low recently.  His systolic blood pressure when he has been working hard in the yard is down below 110.  However he states that his average blood pressure is in the 1 25-1 30 range.  I explained to the patient that this is excellent as long as his average blood pressures in the 120s to 130s.  If he is consistently had a systolic blood pressure around 110 or less we may need to decrease some of his medication however it sounds like this is a one-time isolated occurrence due to dehydration.  He denies any lightheadedness.  He denies any syncope.  He denies any palpitations or tachycardia.  I rechecked the mole that mentioned at his last visit in the center of his back.  The mole today appears unchanged.  There is no irregularities or concerning features.  I do not feel that it requires a biopsy.  Otherwise he is doing well with no concerns Past Medical History:  Diagnosis Date   Arthritis    Complete heart block (HCC)    Hypertension    OSA on CPAP    Presence of permanent cardiac pacemaker    Past Surgical History:  Procedure Laterality Date   APPENDECTOMY  1960s   CARDIAC CATHETERIZATION  Right 09/08/2015   Procedure: Temporary Pacemaker;  Surgeon: Will [name], MD;  Location: [facility];  Service: Cardiovascular;  Laterality: Right;   CARDIAC CATHETERIZATION N/A 09/09/2015   Procedure: Left Heart Cath and Coronary Angiography;  Surgeon: [name], MD;  Location: [facility];  Service: Cardiovascular;  Laterality: N/A;   CHEILECTOMY Left 03/15/2018   Procedure: CHEILECTOMY LEFT FOOT;  Surgeon: [name], MD;  Location: [facility];  Service: Orthopedics;  Laterality: Left;   COLONOSCOPY     EP IMPLANTABLE DEVICE N/A 09/09/2015   Procedure: BiV Pacemaker Insertion CRT-P;  Surgeon: Will Hassell Done  Camnitz, MD;  Location: [facility];  Service: Cardiovascular;  Laterality: N/A;   KNEE SURGERY Right    TOTAL KNEE ARTHROPLASTY Right 07/11/2016   TOTAL KNEE ARTHROPLASTY Right 07/11/2016   Procedure: TOTAL KNEE ARTHROPLASTY;  Surgeon: Sheral Apleyimothy D Murphy, MD;  Location: [facility];  Service: Orthopedics;  Laterality: Right;   Current Outpatient Medications on File Prior to Visit  Medication Sig Dispense Refill   aspirin 81 MG tablet Take 81 mg by mouth daily.     atorvastatin (LIPITOR) 40 MG tablet TAKE 1 TABLET BY MOUTH EVERY DAY AT 6PM 90 tablet 1   carvedilol (COREG) 12.5 MG tablet Take 1 tablet (12.5 mg total) by mouth 2 (two) times daily. 180 tablet 1   lisinopril (ZESTRIL) 40 MG tablet Take 1 tablet (40 mg total) by mouth daily. 90 tablet 1   No current facility-administered medications on file prior to visit.    No Known Allergies Social History   Socioeconomic History   Marital status: Married    Spouse name: Not on file   Number of children: Not on file   Years of education: Not on file   Highest education level: Not on file  Occupational History   Occupation: truck Conservator, museum/gallerydriver  Social Needs   Financial resource strain: Not on file   Food insecurity    Worry: Not on file    Inability: Not on file    Transportation needs    Medical: Not on file    Non-medical: Not on file  Tobacco Use   Smoking status: Former Smoker    Years: 6.00    Types: Cigars   Smokeless tobacco: Former NeurosurgeonUser    Types: Snuff  Substance and Sexual Activity   Alcohol use: Yes    Comment: occ   Drug use: No   Sexual activity: Yes  Lifestyle   Physical activity    Days per week: Not on file    Minutes per session: Not on file   Stress: Not on file  Relationships   Social connections    Talks on phone: Not on file    Gets together: Not on file    Attends religious service: Not on file    Active member of club or organization: Not on file    Attends meetings of clubs or organizations: Not on file    Relationship status: Not on file   Intimate partner violence    Fear of current or ex partner: Not on file    Emotionally abused: Not on file    Physically abused: Not on file    Forced sexual activity: Not on file  Other Topics Concern   Not on file  Social History Narrative   Marital status: Married x 30 years; first marriage     Children: 2 children; no grandchildren     Lives: lives with wife      Employment: truck driver x 14 years; [place]        Tobacco:  None; pipes and cigars; chews tobacco.       Alcohol:  Weekends.      Drugs:  None      Exercise:  Rarely.      Seatbelt:  100%      Guns:  Loaded and secured.   Family History  Problem Relation Age of Onset   Heart disease Father [id number]       AMI   Liver cancer Mother    Cancer Mother 3561  Colon cancer with liver mets   Heart disease Brother 50       AMI   Heart disease Maternal Grandmother       Review of Systems  All other systems reviewed and are negative.      Objective:   Physical Exam Vitals signs reviewed.  Constitutional:      General: He is not in acute distress.    Appearance: Normal appearance. He is well-developed. He is obese. He is not ill-appearing, toxic-appearing or diaphoretic.  HENT:       Head: Normocephalic and atraumatic.  Neck:     Musculoskeletal: No muscular tenderness.     Thyroid: No thyromegaly.  Cardiovascular:     Rate and Rhythm: Normal rate and regular rhythm.     Pulses: Normal pulses.     Heart sounds: Normal heart sounds. No murmur. No friction rub. No gallop.   Pulmonary:     Effort: Pulmonary effort is normal. No respiratory distress.     Breath sounds: Normal breath sounds. No stridor. No wheezing, rhonchi or rales.  Chest:     Chest wall: No tenderness.  Musculoskeletal:     Right lower leg: No edema.     Left lower leg: No edema.  Skin:    Findings: No rash.  Neurological:     General: No focal deficit present.     Mental Status: He is alert and oriented to person, place, and time.     Cranial Nerves: No cranial nerve deficit.     Motor: No weakness.     Coordination: Coordination normal.     Gait: Gait normal.           Assessment & Plan:  The primary encounter diagnosis was Essential hypertension. Diagnoses of Pure hypercholesterolemia and Presence of permanent cardiac pacemaker were also pertinent to this visit. Patient's blood pressure today sounds well controlled.  I will make no changes in his antihypertensives.  I will check a fasting lipid panel.  Ideally I like his LDL cholesterol to be less than 100 and is close to 70 as possible.  I will check his HDL cholesterol to see and I hope that is higher than 42 which is where it was in November.  Continue to encourage aerobic exercise.  If the patient consistently see systolic blood pressures 110 or less I would decrease his antihypertensives.  At the present time however I will make no changes.  The mole that I mentioned at his last visit is normal in appearance and therefore no biopsy is warranted.  Patient did receive Prevnar 13 today.

## 2018-11-05 LAB — CBC WITH DIFFERENTIAL/PLATELET
Absolute Monocytes: 486 {cells}/uL (ref 200–950)
Basophils Absolute: 38 {cells}/uL (ref 0–200)
Basophils Relative: 0.6 %
Eosinophils Absolute: 192 {cells}/uL (ref 15–500)
Eosinophils Relative: 3 %
HCT: 44.7 % (ref 38.5–50.0)
Hemoglobin: 14.8 g/dL (ref 13.2–17.1)
Lymphs Abs: 1510 {cells}/uL (ref 850–3900)
MCH: 30.1 pg (ref 27.0–33.0)
MCHC: 33.1 g/dL (ref 32.0–36.0)
MCV: 90.9 fL (ref 80.0–100.0)
MPV: 10.9 fL (ref 7.5–12.5)
Monocytes Relative: 7.6 %
Neutro Abs: 4173 {cells}/uL (ref 1500–7800)
Neutrophils Relative %: 65.2 %
Platelets: 173 10*3/uL (ref 140–400)
RBC: 4.92 Million/uL (ref 4.20–5.80)
RDW: 12.9 % (ref 11.0–15.0)
Total Lymphocyte: 23.6 %
WBC: 6.4 10*3/uL (ref 3.8–10.8)

## 2018-11-05 LAB — COMPLETE METABOLIC PANEL WITHOUT GFR
AG Ratio: 1.8 (calc) (ref 1.0–2.5)
ALT: 19 U/L (ref 9–46)
AST: 15 U/L (ref 10–35)
Albumin: 4.1 g/dL (ref 3.6–5.1)
Alkaline phosphatase (APISO): 59 U/L (ref 35–144)
BUN/Creatinine Ratio: 25 (calc) — ABNORMAL HIGH (ref 6–22)
BUN: 28 mg/dL — ABNORMAL HIGH (ref 7–25)
CO2: 27 mmol/L (ref 20–32)
Calcium: 9.4 mg/dL (ref 8.6–10.3)
Chloride: 106 mmol/L (ref 98–110)
Creat: 1.1 mg/dL (ref 0.70–1.25)
GFR, Est African American: 80 mL/min/{1.73_m2}
GFR, Est Non African American: 69 mL/min/{1.73_m2}
Globulin: 2.3 g/dL (ref 1.9–3.7)
Glucose, Bld: 102 mg/dL — ABNORMAL HIGH (ref 65–99)
Potassium: 4.6 mmol/L (ref 3.5–5.3)
Sodium: 140 mmol/L (ref 135–146)
Total Bilirubin: 1.5 mg/dL — ABNORMAL HIGH (ref 0.2–1.2)
Total Protein: 6.4 g/dL (ref 6.1–8.1)

## 2018-11-05 LAB — LIPID PANEL
Cholesterol: 125 mg/dL
HDL: 38 mg/dL — ABNORMAL LOW
LDL Cholesterol (Calc): 65 mg/dL
Non-HDL Cholesterol (Calc): 87 mg/dL
Total CHOL/HDL Ratio: 3.3 (calc)
Triglycerides: 132 mg/dL

## 2018-11-26 ENCOUNTER — Encounter: Payer: Medicare Other | Admitting: *Deleted

## 2018-11-26 LAB — CUP PACEART REMOTE DEVICE CHECK
Battery Remaining Longevity: 65 mo
Battery Remaining Percentage: 95.5 %
Battery Voltage: 2.96 V
Brady Statistic AP VP Percent: 90 %
Brady Statistic AP VS Percent: 1 %
Brady Statistic AS VP Percent: 9.6 %
Brady Statistic AS VS Percent: 1 %
Brady Statistic RA Percent Paced: 90 %
Date Time Interrogation Session: 20200901105405
Implantable Lead Implant Date: 20170615
Implantable Lead Implant Date: 20170615
Implantable Lead Implant Date: 20170615
Implantable Lead Location: 753858
Implantable Lead Location: 753859
Implantable Lead Location: 753860
Implantable Pulse Generator Implant Date: 20170615
Lead Channel Impedance Value: 1025 Ohm
Lead Channel Impedance Value: 490 Ohm
Lead Channel Impedance Value: 490 Ohm
Lead Channel Pacing Threshold Amplitude: 0.75 V
Lead Channel Pacing Threshold Amplitude: 1 V
Lead Channel Pacing Threshold Amplitude: 1.875 V
Lead Channel Pacing Threshold Pulse Width: 0.4 ms
Lead Channel Pacing Threshold Pulse Width: 0.4 ms
Lead Channel Pacing Threshold Pulse Width: 0.8 ms
Lead Channel Sensing Intrinsic Amplitude: 12 mV
Lead Channel Sensing Intrinsic Amplitude: 5 mV
Lead Channel Setting Pacing Amplitude: 2 V
Lead Channel Setting Pacing Amplitude: 2 V
Lead Channel Setting Pacing Amplitude: 2.875
Lead Channel Setting Pacing Pulse Width: 0.4 ms
Lead Channel Setting Pacing Pulse Width: 0.8 ms
Lead Channel Setting Sensing Sensitivity: 5 mV
Pulse Gen Model: 3262
Pulse Gen Serial Number: 7894586

## 2018-12-04 DIAGNOSIS — Z23 Encounter for immunization: Secondary | ICD-10-CM | POA: Diagnosis not present

## 2019-02-25 ENCOUNTER — Ambulatory Visit (INDEPENDENT_AMBULATORY_CARE_PROVIDER_SITE_OTHER): Payer: Medicare Other | Admitting: *Deleted

## 2019-02-25 DIAGNOSIS — I442 Atrioventricular block, complete: Secondary | ICD-10-CM

## 2019-02-25 LAB — CUP PACEART REMOTE DEVICE CHECK
Battery Remaining Longevity: 59 mo
Battery Remaining Percentage: 89 %
Battery Voltage: 2.95 V
Brady Statistic AP VP Percent: 91 %
Brady Statistic AP VS Percent: 1 %
Brady Statistic AS VP Percent: 8.8 %
Brady Statistic AS VS Percent: 1 %
Brady Statistic RA Percent Paced: 91 %
Date Time Interrogation Session: 20201201084642
Implantable Lead Implant Date: 20170615
Implantable Lead Implant Date: 20170615
Implantable Lead Implant Date: 20170615
Implantable Lead Location: 753858
Implantable Lead Location: 753859
Implantable Lead Location: 753860
Implantable Pulse Generator Implant Date: 20170615
Lead Channel Impedance Value: 480 Ohm
Lead Channel Impedance Value: 490 Ohm
Lead Channel Impedance Value: 910 Ohm
Lead Channel Pacing Threshold Amplitude: 0.75 V
Lead Channel Pacing Threshold Amplitude: 1.125 V
Lead Channel Pacing Threshold Amplitude: 1.875 V
Lead Channel Pacing Threshold Pulse Width: 0.4 ms
Lead Channel Pacing Threshold Pulse Width: 0.4 ms
Lead Channel Pacing Threshold Pulse Width: 0.8 ms
Lead Channel Sensing Intrinsic Amplitude: 12 mV
Lead Channel Sensing Intrinsic Amplitude: 5 mV
Lead Channel Setting Pacing Amplitude: 2 V
Lead Channel Setting Pacing Amplitude: 2.125
Lead Channel Setting Pacing Amplitude: 2.875
Lead Channel Setting Pacing Pulse Width: 0.4 ms
Lead Channel Setting Pacing Pulse Width: 0.8 ms
Lead Channel Setting Sensing Sensitivity: 5 mV
Pulse Gen Model: 3262
Pulse Gen Serial Number: 7894586

## 2019-02-27 ENCOUNTER — Other Ambulatory Visit: Payer: Self-pay

## 2019-02-27 ENCOUNTER — Encounter: Payer: Self-pay | Admitting: Cardiology

## 2019-02-27 ENCOUNTER — Ambulatory Visit (INDEPENDENT_AMBULATORY_CARE_PROVIDER_SITE_OTHER): Payer: Medicare Other | Admitting: Cardiology

## 2019-02-27 VITALS — BP 144/86 | HR 64 | Ht 71.0 in | Wt 300.2 lb

## 2019-02-27 DIAGNOSIS — I442 Atrioventricular block, complete: Secondary | ICD-10-CM

## 2019-02-27 NOTE — Progress Notes (Signed)
Electrophysiology Office Note   Date:  02/27/2019   ID:  Harold Robertson, DOB 08-02-1950, MRN [id number]  PCP:  Donita Brooks, MD  Cardiologist:  Duke Salvia Primary Electrophysiologist:  Marcin Holte Jorja Loa, MD    No chief complaint on file.    History of Present Illness: Harold Robertson is a 68 y.o. male who presents today for electrophysiology evaluation.   PMHx of HTN, obesity, OSA compliant with CPAP. Was admitted to the hospital with complete AV block. Had St. Jude CRT-P implanted 09/09/15. EF at the time was 40-45%. LHC showed no evidence for obstructive CAD. Currently feeling well without any major issues. He is planning a right knee replacement for significant arthritis. No date has yet been scheduled.  Today, denies symptoms of palpitations, chest pain, shortness of breath, orthopnea, PND, lower extremity edema, claudication, dizziness, presyncope, syncope, bleeding, or neurologic sequela. The patient is tolerating medications without difficulties.  Overall feels well.  He has no chest pain or shortness of breath.  He has not been exercising very much over the last few months and has gained 15 pounds.  He wishes to go back to exercising.  I have also told him that if he wants to get back into scuba diving that there would be no issue from a heart perspective.  I told him we would have to check on the depth for his pacemaker.   Past Medical History:  Diagnosis Date  . Arthritis   . Complete heart block (HCC)   . Hypertension   . OSA on CPAP   . Presence of permanent cardiac pacemaker    Past Surgical History:  Procedure Laterality Date  . APPENDECTOMY  1960s  . CARDIAC CATHETERIZATION Right 09/08/2015   Procedure: Temporary Pacemaker;  Surgeon: Versa Craton Jorja Loa, MD;  Location: [facility];  Service: Cardiovascular;  Laterality: Right;  . CARDIAC CATHETERIZATION N/A 09/09/2015   Procedure: Left Heart Cath and Coronary Angiography;  Surgeon: Peter M Swaziland, MD;   [facility];  Service: Cardiovascular;  Laterality: N/A;  . CHEILECTOMY Left 03/15/2018   Procedure: CHEILECTOMY LEFT FOOT;  Surgeon: Sheral Apley, MD;  Location: [facility];  Service: Orthopedics;  Laterality: Left;  . COLONOSCOPY    . EP IMPLANTABLE DEVICE N/A 09/09/2015   Procedure: BiV Pacemaker Insertion CRT-P;  Surgeon: Vishaal Strollo Jorja Loa, MD;  [facility];  Service: Cardiovascular;  Laterality: N/A;  . KNEE SURGERY Right   . TOTAL KNEE ARTHROPLASTY Right 07/11/2016  . TOTAL KNEE ARTHROPLASTY Right 07/11/2016   Procedure: TOTAL KNEE ARTHROPLASTY;  Surgeon: Sheral Apley, MD;  Location: [facility];  Service: Orthopedics;  Laterality: Right;     Current Outpatient Medications  Medication Sig Dispense Refill  . aspirin 81 MG tablet Take 81 mg by mouth daily.    Marland Kitchen atorvastatin (LIPITOR) 40 MG tablet TAKE 1 TABLET BY MOUTH EVERY DAY AT 6PM 90 tablet 1  . carvedilol (COREG) 12.5 MG tablet Take 1 tablet (12.5 mg total) by mouth 2 (two) times daily. 180 tablet 1  . lisinopril (ZESTRIL) 40 MG tablet Take 1 tablet (40 mg total) by mouth daily. 90 tablet 1   No current facility-administered medications for this visit.     Allergies:   Patient has no known allergies.   Social History:  The patient  reports that he has quit smoking. His smoking use included cigars. He quit after 6.00 years of use. He has quit using smokeless  tobacco.  His smokeless tobacco use included snuff. He reports current alcohol use. He reports that he does not use drugs.   Family History:  The patient's family history includes Cancer (age of onset: 70) in his mother; Heart disease in his maternal grandmother; Heart disease (age of onset: 8) in his brother; Heart disease (age of onset: 73) in his father; Liver cancer in his mother.    ROS:  Please see the history of present illness.   Otherwise, review of systems is positive for none.   All other systems are reviewed  and negative.   PHYSICAL EXAM: VS:  BP (!) 144/86   Pulse 64   Ht 5\' 11"  (1.803 m)   Wt (!) 300 lb 3.2 oz (136.2 kg)   SpO2 97%   BMI 41.87 kg/m  , BMI Body mass index is 41.87 kg/m. GEN: Well nourished, well developed, in no acute distress  HEENT: normal  Neck: no JVD, carotid bruits, or masses Cardiac: RRR; no murmurs, rubs, or gallops,no edema  Respiratory:  clear to auscultation bilaterally, normal work of breathing GI: soft, nontender, nondistended, + BS MS: no deformity or atrophy  Skin: warm and dry, device site well healed Neuro:  Strength and sensation are intact Psych: euthymic mood, full affect  EKG:  EKG is ordered today. Personal review of the ekg ordered shows AV paced  Personal review of the device interrogation today. Results in [facility]: 11/04/2018: ALT 19; BUN 28; Creat 1.10; Hemoglobin 14.8; Platelets 173; Potassium 4.6; Sodium 140    Lipid Panel     Component Value Date/Time   CHOL 125 11/04/2018 0816   TRIG 132 11/04/2018 0816   HDL 38 (L) 11/04/2018 0816   CHOLHDL 3.3 11/04/2018 0816   VLDL 18 11/16/2016 0950   LDLCALC 65 11/04/2018 0816     Wt Readings from Last 3 Encounters:  02/27/19 (!) 300 lb 3.2 oz (136.2 kg)  11/04/18 292 lb (132.5 kg)  05/06/18 (!) 301 lb (136.5 kg)      Other studies Reviewed: Additional studies/ records that were reviewed today include: Cardiac cath 09/09/15, TTE 09/08/15  Review of the above records today demonstrates:   Prox RCA lesion, 20% stenosed.  Prox LAD to Mid LAD lesion, 40% stenosed.  Mid Cx lesion, 20% stenosed.  Ost 2nd Mrg to 2nd Mrg lesion, 20% stenosed.  1st Diag lesion, 30% stenosed.  There is mild to moderate left ventricular systolic dysfunction.   1. Nonobstructive CAD 2. Mild to moderate LV dysfunction. EF 40-45%.  - Left ventricle: The cavity size was normal. There was mild   concentric hypertrophy. Systolic function was mildly to   moderately reduced. The  estimated ejection fraction was in the   range of 40% to 45%. Diffuse hypokinesis. - Mitral valve: There was mild to moderate regurgitation. - Left atrium: The atrium was severely dilated. - Right ventricle: The cavity size was mildly dilated. Wall   thickness was normal. Systolic function was moderately reduced. - Right atrium: The atrium was severely dilated.  ASSESSMENT AND PLAN:  1.  Complete AV block: Status post [facility] Jude CRT-P.  Device functioning appropriately.  No changes.  2. nonobstructive CAD: No current chest pain.  Continue current management.  3. Hypertension: mildly elevated today but normal at home, no changes  4. Hyperlipidemia: Continue atorvastatin   Current medicines are reviewed at length with the patient today.   The patient does not have concerns regarding his medicines.  The  following changes were made today:  none  Labs/ tests ordered today include:  Orders Placed This Encounter  Procedures  . EKG 12-Lead     Disposition:   FU with Cicely Ortner 1 year  Signed, Davonda Ausley Jorja Loa, MD  02/27/2019 3:13 PM     [facility] [address] [place] [address] [phone] (office) [phone] (fax)

## 2019-02-27 NOTE — Patient Instructions (Addendum)
Medication Instructions:  Your physician recommends that you continue on your current medications as directed. Please refer to the Current Medication list given to you today.  *If you need a refill on your cardiac medications before your next appointment, please call your pharmacy*  Labwork: None ordered  Testing/Procedures: None ordered  Follow-Up: Remote monitoring is used to monitor your Pacemaker or ICD from home. This monitoring reduces the number of office visits required to check your device to one time per year. It allows [place] to keep an eye on the functioning of your device to ensure it is working properly. You are scheduled for a device check from home on 05/29/19. You may send your transmission at any time that day. If you have a wireless device, the transmission will be sent automatically. After your physician reviews your transmission, you will receive a postcard with your next transmission date.  At [facility], you and your health needs are our priority.  As part of our continuing mission to provide you with exceptional heart care, we have created designated Provider Care Teams.  These Care Teams include your primary Cardiologist (physician) and Advanced Practice Providers (APPs -  Physician Assistants and Nurse Practitioners) who all work together to provide you with the care you need, when you need it.  You will need a follow up appointment in 12 months.  Please call our office 2 months in advance to schedule this appointment.  You may see Dr Curt Bears or one of the following Advanced Practice Providers on your designated Care Team:    Chanetta Marshall, NP  Tommye Standard, PA-C  Oda Kilts, [place]  Thank you for choosing [facility]!!   Trinidad Curet, RN [phone]  Any Other Special Instructions Will Be Listed Below (If Applicable).

## 2019-03-14 ENCOUNTER — Other Ambulatory Visit: Payer: Self-pay | Admitting: Cardiology

## 2019-03-22 NOTE — Progress Notes (Signed)
PPM remote 

## 2019-03-29 ENCOUNTER — Other Ambulatory Visit: Payer: Self-pay | Admitting: Cardiology

## 2019-04-15 ENCOUNTER — Ambulatory Visit: Payer: Medicare Other | Attending: Internal Medicine

## 2019-04-15 DIAGNOSIS — Z23 Encounter for immunization: Secondary | ICD-10-CM | POA: Diagnosis not present

## 2019-04-15 NOTE — Progress Notes (Signed)
   Covid-19 Vaccination Clinic  Name:  Harold Robertson    MRN: [id number] DOB: 03/25/1951  04/15/2019  [name] was observed post Covid-19 immunization for 15 minutes without incidence. He was provided with Vaccine Information Sheet and instruction to access the V-Safe system.   [name] was instructed to call 911 with any severe reactions post vaccine: Marland Kitchen Difficulty breathing  . Swelling of your face and throat  . A fast heartbeat  . A bad rash all over your body  . Dizziness and weakness    Immunizations Administered    Name Date Dose VIS Date Route   Pfizer COVID-19 Vaccine 04/15/2019  4:43 PM 0.3 mL 03/07/2019 Intramuscular   Manufacturer: [employer], [employer]   Lot: [id number]   NDC: [address]-4

## 2019-05-06 ENCOUNTER — Ambulatory Visit: Payer: Medicare Other | Attending: Internal Medicine

## 2019-05-06 DIAGNOSIS — Z23 Encounter for immunization: Secondary | ICD-10-CM | POA: Insufficient documentation

## 2019-05-06 NOTE — Progress Notes (Signed)
   Covid-19 Vaccination Clinic  Name:  Harold Robertson    MRN: [id number] DOB: 10/22/1950  05/06/2019  [name] was observed post Covid-19 immunization for 15 minutes without incidence. He was provided with Vaccine Information Sheet and instruction to access the V-Safe system.   [name] was instructed to call 911 with any severe reactions post vaccine: Marland Kitchen Difficulty breathing  . Swelling of your face and throat  . A fast heartbeat  . A bad rash all over your body  . Dizziness and weakness    Immunizations Administered    Name Date Dose VIS Date Route   Pfizer COVID-19 Vaccine 05/06/2019  8:59 AM 0.3 mL 03/07/2019 Intramuscular   Manufacturer: [employer], [employer]   Lot: [id number]   NDC: [address]-1

## 2019-05-19 ENCOUNTER — Ambulatory Visit (INDEPENDENT_AMBULATORY_CARE_PROVIDER_SITE_OTHER): Payer: Medicare Other | Admitting: Family Medicine

## 2019-05-19 ENCOUNTER — Encounter: Payer: Self-pay | Admitting: Family Medicine

## 2019-05-19 ENCOUNTER — Other Ambulatory Visit: Payer: Self-pay

## 2019-05-19 VITALS — BP 100/62 | HR 70 | Temp 97.6°F | Resp 18 | Ht 71.0 in | Wt 303.0 lb

## 2019-05-19 DIAGNOSIS — Z1211 Encounter for screening for malignant neoplasm of colon: Secondary | ICD-10-CM

## 2019-05-19 DIAGNOSIS — E7800 Pure hypercholesterolemia, unspecified: Secondary | ICD-10-CM | POA: Diagnosis not present

## 2019-05-19 DIAGNOSIS — Z0000 Encounter for general adult medical examination without abnormal findings: Secondary | ICD-10-CM

## 2019-05-19 DIAGNOSIS — I5020 Unspecified systolic (congestive) heart failure: Secondary | ICD-10-CM

## 2019-05-19 DIAGNOSIS — Z125 Encounter for screening for malignant neoplasm of prostate: Secondary | ICD-10-CM | POA: Diagnosis not present

## 2019-05-19 NOTE — Progress Notes (Signed)
Subjective:    Patient ID: Harold Robertson, male    DOB: [birth year], 69 y.o.   MRN: [id number]  HPI  Patient is here today for complete physical exam.  Past medical history is significant for atrioventricular block, complete heart block, requiring pacemaker placement.  Apparently he was hospitalized with a heart rate in the 20s per his report requiring pacemaker placement.  At that time in 2017, his ejection fraction was 40 to 45% on echocardiogram.  He is done well since his pacemaker has been placed and follows up regularly with his EP specialist Dr. Elberta Fortis.  He denies any syncope.  He denies any bradycardia.  He denies any chest pain or shortness of breath.  He denies any orthopnea or paroxysmal nocturnal dyspnea.   His last PSA was checked in November 2019 and was normal.  Therefore he is due for prostate cancer screening.  He denies any symptoms of lower urinary tract obstruction.  His last colonoscopy was performed in 2016.  It was significant for 2, tubular adenomas.  Therefore he is due for a repeat colonoscopy this year.  His immunizations are listed below.  He is due for Shingrix.  Otherwise his immunizations are up-to-date.  He denies any falls, memory loss, or depression.  He does have an elevated BMI of 42 and has multiple medical problems related to obesity including hyperlipidemia, obstructive sleep apnea, and hypertension. Immunization History  Administered Date(s) Administered  . Fluad Quad(high Dose 65+) 12/04/2018  . Influenza Split 01/31/2013, 03/17/2015  . Influenza, High Dose Seasonal PF 12/18/2017  . Influenza,inj,Quad PF,6+ Mos 01/14/2016, 11/16/2016  . Influenza-Unspecified 01/02/2014  . PFIZER SARS-COV-2 Vaccination 04/15/2019, 05/06/2019  . Pneumococcal Conjugate-13 11/04/2018  . Pneumococcal Polysaccharide-23 01/28/2018  . Tdap 01/14/2016, 05/11/2016    Past Medical History:  Diagnosis Date  . Arthritis   . Complete heart block (HCC)   . Hypertension   . OSA on  CPAP   . Presence of permanent cardiac pacemaker    Past Surgical History:  Procedure Laterality Date  . APPENDECTOMY  1960s  . CARDIAC CATHETERIZATION Right 09/08/2015   Procedure: Temporary Pacemaker;  Surgeon: Will Jorja Loa, MD;  Location: [facility] INVASIVE CV [facility];  Service: Cardiovascular;  Laterality: Right;  . CARDIAC CATHETERIZATION N/A 09/09/2015   Procedure: Left Heart Cath and Coronary Angiography;  Surgeon: Peter M Swaziland, MD;  [facility]: [facility] INVASIVE CV LAB;  Service: Cardiovascular;  Laterality: N/A;  . CHEILECTOMY Left 03/15/2018   Procedure: CHEILECTOMY LEFT FOOT;  Surgeon: Sheral Apley, MD;  Location: [facility];  Service: Orthopedics;  Laterality: Left;  . COLONOSCOPY    . EP IMPLANTABLE DEVICE N/A 09/09/2015   Procedure: BiV Pacemaker Insertion CRT-P;  Surgeon: Will Jorja Loa, MD;  Location: [facility] INVASIVE CV LAB;  Service: Cardiovascular;  Laterality: N/A;  . KNEE SURGERY Right   . TOTAL KNEE ARTHROPLASTY Right 07/11/2016  . TOTAL KNEE ARTHROPLASTY Right 07/11/2016   Procedure: TOTAL KNEE ARTHROPLASTY;  Surgeon: Sheral Apley, MD;  Location: [facility] OR;  Service: Orthopedics;  Laterality: Right;   Current Outpatient Medications on File Prior to Visit  Medication Sig Dispense Refill  . aspirin 81 MG tablet Take 81 mg by mouth daily.    Marland Kitchen atorvastatin (LIPITOR) 40 MG tablet TAKE 1 TABLET BY MOUTH EVERY DAY AT 6PM 90 tablet 3  . carvedilol (COREG) 12.5 MG tablet TAKE 1 TABLET BY MOUTH TWICE A DAY 180 tablet 3  . lisinopril (ZESTRIL) 40 MG tablet TAKE 1 TABLET  BY MOUTH EVERY DAY 90 tablet 3   No current facility-administered medications on file prior to visit.   No Known Allergies Social History   Socioeconomic History  . Marital status: Married    Spouse name: Not on file  . Number of children: Not on file  . Years of education: Not on file  . Highest education level: Not on file  Occupational History  . Occupation: truck Geophysicist/field seismologist  Tobacco Use  .  Smoking status: Former Smoker    Years: 6.00    Types: Cigars  . Smokeless tobacco: Former Systems developer    Types: Snuff  Substance and Sexual Activity  . Alcohol use: Yes    Comment: occ  . Drug use: No  . Sexual activity: Yes  Other Topics Concern  . Not on file  Social History Narrative   Marital status: Married x 30 years; first marriage     Children: 2 children; no grandchildren     Lives: lives with wife      Employment: truck driver x 14 years; [place].        Tobacco:  None; pipes and cigars; chews tobacco.       Alcohol:  Weekends.      Drugs:  None      Exercise:  Rarely.      Seatbelt:  100%      Guns:  Loaded and secured.   Social Determinants of Health   Financial Resource Strain:   . Difficulty of Paying Living Expenses: Not on file  Food Insecurity:   . Worried About Charity fundraiser in the Last Year: Not on file  . Ran Out of Food in the Last Year: Not on file  Transportation Needs:   . Lack of Transportation (Medical): Not on file  . Lack of Transportation (Non-Medical): Not on file  Physical Activity:   . Days of Exercise per Week: Not on file  . Minutes of Exercise per Session: Not on file  Stress:   . Feeling of Stress : Not on file  Social Connections:   . Frequency of Communication with Friends and Family: Not on file  . Frequency of Social Gatherings with Friends and Family: Not on file  . Attends Religious Services: Not on file  . Active Member of Clubs or Organizations: Not on file  . Attends Archivist Meetings: Not on file  . Marital Status: Not on file  Intimate Partner Violence:   . Fear of Current or Ex-Partner: Not on file  . Emotionally Abused: Not on file  . Physically Abused: Not on file  . Sexually Abused: Not on file   Family History  Problem Relation Age of Onset  . Heart disease Father 78       AMI  . Liver cancer Mother   . Cancer Mother 79       Colon cancer with liver mets  . Heart disease Brother 39        AMI  . Heart disease Maternal Grandmother       Review of Systems  All other systems reviewed and are negative.      Objective:   Physical Exam Vitals reviewed.  Constitutional:      General: He is not in acute distress.    Appearance: Normal appearance. He is well-developed. He is obese. He is not ill-appearing, toxic-appearing or diaphoretic.  HENT:     Head: Normocephalic and atraumatic.     Right Ear: Tympanic membrane, ear  canal and external ear normal.     Left Ear: Tympanic membrane, ear canal and external ear normal.     Nose: Nose normal. No congestion or rhinorrhea.     Mouth/Throat:     Pharynx: Oropharynx is clear. No oropharyngeal exudate or posterior oropharyngeal erythema.  Eyes:     General: No scleral icterus.       Right eye: No discharge.        Left eye: No discharge.     Extraocular Movements: Extraocular movements intact.     Conjunctiva/sclera: Conjunctivae normal.     Pupils: Pupils are equal, round, and reactive to light.  Neck:     Thyroid: No thyromegaly.     Vascular: No carotid bruit.  Cardiovascular:     Rate and Rhythm: Normal rate and regular rhythm.     Pulses: Normal pulses.     Heart sounds: Normal heart sounds. No murmur. No friction rub. No gallop.   Pulmonary:     Effort: Pulmonary effort is normal. No respiratory distress.     Breath sounds: Normal breath sounds. No stridor. No wheezing, rhonchi or rales.  Chest:     Chest wall: No tenderness.  Abdominal:     General: Bowel sounds are normal. There is no distension.     Palpations: Abdomen is soft. There is no mass.     Tenderness: There is no abdominal tenderness. There is no guarding or rebound.     Hernia: No hernia is present.  Musculoskeletal:        General: No deformity.     Cervical back: Normal range of motion and neck supple. No rigidity. No muscular tenderness.     Right lower leg: No edema.     Left lower leg: No edema.  Lymphadenopathy:     Cervical: No cervical  adenopathy.  Skin:    Coloration: Skin is not jaundiced.     Findings: No bruising, erythema, lesion or rash.  Neurological:     General: No focal deficit present.     Mental Status: He is alert and oriented to person, place, and time.     Cranial Nerves: No cranial nerve deficit.     Sensory: No sensory deficit.     Motor: No weakness.     Coordination: Coordination normal.     Gait: Gait normal.     Deep Tendon Reflexes: Reflexes normal.  Psychiatric:        Mood and Affect: Mood normal.        Behavior: Behavior normal.        Thought Content: Thought content normal.        Judgment: Judgment normal.           Assessment & Plan:  Pure hypercholesterolemia - Plan: CBC with Differential/Platelet, COMPLETE METABOLIC PANEL WITH GFR, Lipid panel  Prostate cancer screening - Plan: PSA  Colon cancer screening - Plan: Ambulatory referral to Gastroenterology  General medical exam  Systolic congestive heart failure, NYHA class 1, unspecified congestive heart failure chronicity (HCC)  Patient denies any falls, memory loss, or depression.  I will contact his gastroenterologist and schedule the patient for a colonoscopy given his history of 2, tubular adenomas.  I will screen the patient for prostate cancer by checking a PSA.  I have recommended that the patient receive the shingles vaccine.  Otherwise his preventative care is up-to-date.  I will obtain fasting baseline lab work including a CBC, CMP, fasting lipid panel.  I would like  to see his LDL cholesterol less than 124.  Blood pressure today is outstanding at 100/62.  We also discussed bariatric surgery.  I have given the patient contact information for [facility] surgery and discussed the strategies and protocol for evaluation for bariatric surgery.  The patient is interested.  He will contact [facility] and likely schedule to go to one of their outpatient seminars.  Regular anticipatory guidance is provided.

## 2019-05-20 LAB — LIPID PANEL
Cholesterol: 147 mg/dL (ref <200)
HDL: 38 mg/dL — ABNORMAL LOW (ref >=40)
LDL Cholesterol (Calc): 86 mg/dL (calc)
Non-HDL Cholesterol (Calc): 109 mg/dL (calc) (ref <130)
Total CHOL/HDL Ratio: 3.9 (calc) (ref <5.0)
Triglycerides: 135 mg/dL (ref <150)

## 2019-05-20 LAB — COMPLETE METABOLIC PANEL WITH GFR
AG Ratio: 1.8 (calc) (ref 1.0–2.5)
ALT: 24 U/L (ref 9–46)
AST: 21 U/L (ref 10–35)
Albumin: 4.2 g/dL (ref 3.6–5.1)
Alkaline phosphatase (APISO): 66 U/L (ref 35–144)
BUN: 18 mg/dL (ref 7–25)
CO2: 27 mmol/L (ref 20–32)
Calcium: 9.5 mg/dL (ref 8.6–10.3)
Chloride: 103 mmol/L (ref 98–110)
Creat: 1.14 mg/dL (ref 0.70–1.25)
GFR, Est African American: 77 mL/min/{1.73_m2} (ref >=60)
GFR, Est Non African American: 66 mL/min/{1.73_m2} (ref >=60)
Globulin: 2.4 g/dL (calc) (ref 1.9–3.7)
Glucose, Bld: 118 mg/dL — ABNORMAL HIGH (ref 65–99)
Potassium: 5 mmol/L (ref 3.5–5.3)
Sodium: 139 mmol/L (ref 135–146)
Total Bilirubin: 1.4 mg/dL — ABNORMAL HIGH (ref 0.2–1.2)
Total Protein: 6.6 g/dL (ref 6.1–8.1)

## 2019-05-20 LAB — CBC WITH DIFFERENTIAL/PLATELET
Absolute Monocytes: 512 cells/uL (ref 200–950)
Basophils Absolute: 39 cells/uL (ref 0–200)
Basophils Relative: 0.7 %
Eosinophils Absolute: 209 cells/uL (ref 15–500)
Eosinophils Relative: 3.8 %
HCT: 47.5 % (ref 38.5–50.0)
Hemoglobin: 16.3 g/dL (ref 13.2–17.1)
Lymphs Abs: 1650 cells/uL (ref 850–3900)
MCH: 30.2 pg (ref 27.0–33.0)
MCHC: 34.3 g/dL (ref 32.0–36.0)
MCV: 88.1 fL (ref 80.0–100.0)
MPV: 10.7 fL (ref 7.5–12.5)
Monocytes Relative: 9.3 %
Neutro Abs: 3091 cells/uL (ref 1500–7800)
Neutrophils Relative %: 56.2 %
Platelets: 204 10*3/uL (ref 140–400)
RBC: 5.39 10*6/uL (ref 4.20–5.80)
RDW: 12.5 % (ref 11.0–15.0)
Total Lymphocyte: 30 %
WBC: 5.5 10*3/uL (ref 3.8–10.8)

## 2019-05-20 LAB — PSA: PSA: 0.8 ng/mL (ref <=4.0)

## 2019-05-27 ENCOUNTER — Ambulatory Visit (INDEPENDENT_AMBULATORY_CARE_PROVIDER_SITE_OTHER): Payer: Medicare Other | Admitting: *Deleted

## 2019-05-27 DIAGNOSIS — I442 Atrioventricular block, complete: Secondary | ICD-10-CM

## 2019-05-27 LAB — CUP PACEART REMOTE DEVICE CHECK
Battery Remaining Longevity: 59 mo
Battery Remaining Percentage: 89 %
Battery Voltage: 2.95 V
Brady Statistic AP VP Percent: 91 %
Brady Statistic AP VS Percent: 1 %
Brady Statistic AS VP Percent: 9.3 %
Brady Statistic AS VS Percent: 1 %
Brady Statistic RA Percent Paced: 90 %
Date Time Interrogation Session: 20210302074041
Implantable Lead Implant Date: 20170615
Implantable Lead Implant Date: 20170615
Implantable Lead Implant Date: 20170615
Implantable Lead Location: 753858
Implantable Lead Location: 753859
Implantable Lead Location: 753860
Implantable Pulse Generator Implant Date: 20170615
Lead Channel Impedance Value: 460 Ohm
Lead Channel Impedance Value: 460 Ohm
Lead Channel Impedance Value: 840 Ohm
Lead Channel Pacing Threshold Amplitude: 0.625 V
Lead Channel Pacing Threshold Amplitude: 1 V
Lead Channel Pacing Threshold Amplitude: 2.25 V
Lead Channel Pacing Threshold Pulse Width: 0.4 ms
Lead Channel Pacing Threshold Pulse Width: 0.4 ms
Lead Channel Pacing Threshold Pulse Width: 0.8 ms
Lead Channel Sensing Intrinsic Amplitude: 4.9 mV
Lead Channel Sensing Intrinsic Amplitude: 9.6 mV
Lead Channel Setting Pacing Amplitude: 1.625
Lead Channel Setting Pacing Amplitude: 2 V
Lead Channel Setting Pacing Amplitude: 3.25 V
Lead Channel Setting Pacing Pulse Width: 0.4 ms
Lead Channel Setting Pacing Pulse Width: 0.8 ms
Lead Channel Setting Sensing Sensitivity: 5 mV
Pulse Gen Model: 3262
Pulse Gen Serial Number: 7894586

## 2019-05-28 NOTE — Progress Notes (Signed)
PPM Remote  

## 2019-08-26 ENCOUNTER — Ambulatory Visit (INDEPENDENT_AMBULATORY_CARE_PROVIDER_SITE_OTHER): Payer: Medicare Other | Admitting: *Deleted

## 2019-08-26 DIAGNOSIS — I442 Atrioventricular block, complete: Secondary | ICD-10-CM

## 2019-08-27 LAB — CUP PACEART REMOTE DEVICE CHECK
Battery Remaining Longevity: 58 mo
Battery Remaining Percentage: 89 %
Battery Voltage: 2.95 V
Brady Statistic AP VP Percent: 89 %
Brady Statistic AP VS Percent: 1 %
Brady Statistic AS VP Percent: 11 %
Brady Statistic AS VS Percent: 1 %
Brady Statistic RA Percent Paced: 89 %
Date Time Interrogation Session: 20210601172233
Implantable Lead Implant Date: 20170615
Implantable Lead Implant Date: 20170615
Implantable Lead Implant Date: 20170615
Implantable Lead Location: 753858
Implantable Lead Location: 753859
Implantable Lead Location: 753860
Implantable Pulse Generator Implant Date: 20170615
Lead Channel Impedance Value: 410 Ohm
Lead Channel Impedance Value: 440 Ohm
Lead Channel Impedance Value: 900 Ohm
Lead Channel Pacing Threshold Amplitude: 0.625 V
Lead Channel Pacing Threshold Amplitude: 1 V
Lead Channel Pacing Threshold Amplitude: 2 V
Lead Channel Pacing Threshold Pulse Width: 0.4 ms
Lead Channel Pacing Threshold Pulse Width: 0.4 ms
Lead Channel Pacing Threshold Pulse Width: 0.8 ms
Lead Channel Sensing Intrinsic Amplitude: 12 mV
Lead Channel Sensing Intrinsic Amplitude: 5 mV
Lead Channel Setting Pacing Amplitude: 1.625
Lead Channel Setting Pacing Amplitude: 2 V
Lead Channel Setting Pacing Amplitude: 3 V
Lead Channel Setting Pacing Pulse Width: 0.4 ms
Lead Channel Setting Pacing Pulse Width: 0.8 ms
Lead Channel Setting Sensing Sensitivity: 5 mV
Pulse Gen Model: 3262
Pulse Gen Serial Number: 7894586

## 2019-08-27 NOTE — Progress Notes (Signed)
Remote pacemaker transmission.   

## 2019-11-25 ENCOUNTER — Ambulatory Visit (INDEPENDENT_AMBULATORY_CARE_PROVIDER_SITE_OTHER): Payer: Medicare Other | Admitting: *Deleted

## 2019-11-25 DIAGNOSIS — I442 Atrioventricular block, complete: Secondary | ICD-10-CM

## 2019-11-25 LAB — CUP PACEART REMOTE DEVICE CHECK
Battery Remaining Longevity: 50 mo
Battery Remaining Percentage: 80 %
Battery Voltage: 2.93 V
Brady Statistic AP VP Percent: 89 %
Brady Statistic AP VS Percent: 1 %
Brady Statistic AS VP Percent: 10 %
Brady Statistic AS VS Percent: 1 %
Brady Statistic RA Percent Paced: 89 %
Date Time Interrogation Session: 20210831092030
Implantable Lead Implant Date: 20170615
Implantable Lead Implant Date: 20170615
Implantable Lead Implant Date: 20170615
Implantable Lead Location: 753858
Implantable Lead Location: 753859
Implantable Lead Location: 753860
Implantable Pulse Generator Implant Date: 20170615
Lead Channel Impedance Value: 460 Ohm
Lead Channel Impedance Value: 490 Ohm
Lead Channel Impedance Value: 850 Ohm
Lead Channel Pacing Threshold Amplitude: 0.625 V
Lead Channel Pacing Threshold Amplitude: 1 V
Lead Channel Pacing Threshold Amplitude: 2.125 V
Lead Channel Pacing Threshold Pulse Width: 0.4 ms
Lead Channel Pacing Threshold Pulse Width: 0.4 ms
Lead Channel Pacing Threshold Pulse Width: 0.8 ms
Lead Channel Sensing Intrinsic Amplitude: 12 mV
Lead Channel Sensing Intrinsic Amplitude: 4.4 mV
Lead Channel Setting Pacing Amplitude: 1.625
Lead Channel Setting Pacing Amplitude: 2 V
Lead Channel Setting Pacing Amplitude: 3.125
Lead Channel Setting Pacing Pulse Width: 0.4 ms
Lead Channel Setting Pacing Pulse Width: 0.8 ms
Lead Channel Setting Sensing Sensitivity: 5 mV
Pulse Gen Model: 3262
Pulse Gen Serial Number: 7894586

## 2019-11-26 NOTE — Progress Notes (Signed)
Remote pacemaker transmission.   

## 2019-12-26 ENCOUNTER — Telehealth: Payer: Self-pay | Admitting: Emergency Medicine

## 2019-12-26 DIAGNOSIS — I480 Paroxysmal atrial fibrillation: Secondary | ICD-10-CM

## 2019-12-26 MED ORDER — APIXABAN 5 MG PO TABS: 5.0000 mg | ORAL_TABLET | Freq: Two times a day (BID) | ORAL | 3 refills | Status: DC

## 2019-12-26 MED ORDER — APIXABAN 2.5 MG PO TABS: 5.0000 mg | ORAL_TABLET | Freq: Two times a day (BID) | ORAL | Status: DC

## 2019-12-26 NOTE — Telephone Encounter (Signed)
Called and spoke with patient.  He is agreeable to appt 12/29/19 at 1:30 pm with Jorja Loa, PA.

## 2019-12-26 NOTE — Telephone Encounter (Signed)
Alert received for AF episode that started on 12/24/19  at 0730 am and is ongoing with v-rates from 78-131. No OAC or hx of AF. Patient reports he has felt a little fatigue but denies CP, SOB, palpitations or pedal edema. CHADSVASC2 -4. Dr Ladona Ridgel reviewed and will start Eliquis 5 mg BID and follow up with AF Clinic. Patient is to stop ASA when he starts Eliquis.

## 2019-12-29 ENCOUNTER — Ambulatory Visit (HOSPITAL_COMMUNITY)
Admission: RE | Admit: 2019-12-29 | Discharge: 2019-12-29 | Disposition: A | Discharge status: Home / Self Care | Payer: Medicare Other | Source: Ambulatory Visit | Attending: Physician Assistant | Admitting: Physician Assistant

## 2019-12-29 ENCOUNTER — Other Ambulatory Visit: Payer: Self-pay

## 2019-12-29 ENCOUNTER — Encounter (HOSPITAL_COMMUNITY): Payer: Self-pay | Admitting: Physician Assistant

## 2019-12-29 VITALS — BP 118/82 | HR 79 | Ht 71.0 in | Wt 284.8 lb

## 2019-12-29 DIAGNOSIS — I2510 Atherosclerotic heart disease of native coronary artery without angina pectoris: Secondary | ICD-10-CM | POA: Insufficient documentation

## 2019-12-29 DIAGNOSIS — G4733 Obstructive sleep apnea (adult) (pediatric): Secondary | ICD-10-CM | POA: Diagnosis not present

## 2019-12-29 DIAGNOSIS — I4892 Unspecified atrial flutter: Secondary | ICD-10-CM | POA: Insufficient documentation

## 2019-12-29 DIAGNOSIS — Z950 Presence of cardiac pacemaker: Secondary | ICD-10-CM | POA: Insufficient documentation

## 2019-12-29 DIAGNOSIS — Z87891 Personal history of nicotine dependence: Secondary | ICD-10-CM | POA: Insufficient documentation

## 2019-12-29 DIAGNOSIS — I10 Essential (primary) hypertension: Secondary | ICD-10-CM | POA: Insufficient documentation

## 2019-12-29 DIAGNOSIS — I480 Paroxysmal atrial fibrillation: Secondary | ICD-10-CM | POA: Insufficient documentation

## 2019-12-29 DIAGNOSIS — Z7901 Long term (current) use of anticoagulants: Secondary | ICD-10-CM | POA: Diagnosis not present

## 2019-12-29 DIAGNOSIS — Z79899 Other long term (current) drug therapy: Secondary | ICD-10-CM | POA: Insufficient documentation

## 2019-12-29 DIAGNOSIS — Z6839 Body mass index (BMI) 39.0-39.9, adult: Secondary | ICD-10-CM | POA: Insufficient documentation

## 2019-12-29 DIAGNOSIS — E669 Obesity, unspecified: Secondary | ICD-10-CM | POA: Insufficient documentation

## 2019-12-29 DIAGNOSIS — Z8249 Family history of ischemic heart disease and other diseases of the circulatory system: Secondary | ICD-10-CM | POA: Insufficient documentation

## 2019-12-29 DIAGNOSIS — D6869 Other thrombophilia: Secondary | ICD-10-CM | POA: Insufficient documentation

## 2019-12-29 LAB — BASIC METABOLIC PANEL
Anion gap: 9 (ref 5–15)
BUN: 15 mg/dL (ref 8–23)
CO2: 23 mmol/L (ref 22–32)
Calcium: 9 mg/dL (ref 8.9–10.3)
Chloride: 107 mmol/L (ref 98–111)
Creatinine, Ser: 1.11 mg/dL (ref 0.61–1.24)
GFR calc Af Amer: >60 mL/min (ref >60)
GFR calc non Af Amer: >60 mL/min (ref >60)
Glucose, Bld: 91 mg/dL (ref 70–99)
Potassium: 4.2 mmol/L (ref 3.5–5.1)
Sodium: 139 mmol/L (ref 135–145)

## 2019-12-29 LAB — CBC
HCT: 44.9 % (ref 39.0–52.0)
Hemoglobin: 14.7 g/dL (ref 13.0–17.0)
MCH: 29.5 pg (ref 26.0–34.0)
MCHC: 32.7 g/dL (ref 30.0–36.0)
MCV: 90 fL (ref 80.0–100.0)
Platelets: 192 10*3/uL (ref 150–400)
RBC: 4.99 MIL/uL (ref 4.22–5.81)
RDW: 12.3 % (ref 11.5–15.5)
WBC: 5.8 10*3/uL (ref 4.0–10.5)
nRBC: 0 % (ref 0.0–0.2)

## 2019-12-29 LAB — TSH: TSH: 0.949 u[IU]/mL (ref 0.350–4.500)

## 2019-12-29 NOTE — Progress Notes (Signed)
Primary Care Physician: Donita Brooks, MD Primary Cardiologist: Dr Duke Salvia (remotely) Primary Electrophysiologist: Dr Elberta Fortis Referring Physician: Dr Bartolo D[facility] clinic   Harold Robertson is a 69 y.o. male with a history of HTN, OSA, CHB s/p PPM, non obstructive CAD LHC 2017, NICM, and atrial fibrillation who presents for follow up in the [facility].  The patient was initially diagnosed with atrial fibrillation on a device alert 12/24/19. The strips were reviewed by Dr Ladona Ridgel who stopped ASA and started Eliquis. Patient did have some fatigue and mild dyspnea with exertion but was otherwise unaware of his arrhythmia. Patient is on Eliquis for a CHADS2VASC score of 4. He reports compliance with his CPAP therapy and denies significant alcohol use.   Today, he denies symptoms of palpitations, chest pain, orthopnea, PND, lower extremity edema, dizziness, presyncope, syncope, bleeding, or neurologic sequela. The patient is tolerating medications without difficulties and is otherwise without complaint today.    Atrial Fibrillation Risk Factors:  he does have symptoms or diagnosis of sleep apnea. he is compliant with CPAP therapy. he does not have a history of rheumatic fever. he does not have a history of alcohol use. The patient does not have a history of early familial atrial fibrillation or other arrhythmias.  he has a BMI of Body mass index is 39.72 kg/m.Marland Kitchen Filed Weights   12/29/19 1342  Weight: 129.2 kg    Family History  Problem Relation Age of Onset  . Heart disease Father 62       AMI  . Liver cancer Mother   . Cancer Mother 34       Colon cancer with liver mets  . Heart disease Brother 50       AMI  . Heart disease Maternal Grandmother      Atrial Fibrillation Management history:  Previous antiarrhythmic drugs: none Previous cardioversions: none Previous ablations: none CHADS2VASC score: 4 Anticoagulation history:  Eliquis   Past Medical History:  Diagnosis Date  . Arthritis   . Complete heart block (HCC)   . Hypertension   . OSA on CPAP   . Presence of permanent cardiac pacemaker    Past Surgical History:  Procedure Laterality Date  . APPENDECTOMY  1960s  . CARDIAC CATHETERIZATION Right 09/08/2015   Procedure: Temporary Pacemaker;  Surgeon: Will Jorja Loa, MD;  Location: [facility] INVASIVE CV LAB;  Service: Cardiovascular;  Laterality: Right;  . CARDIAC CATHETERIZATION N/A 09/09/2015   Procedure: Left Heart Cath and Coronary Angiography;  Surgeon: Peter M Swaziland, MD;  Location: [facility] INVASIVE CV LAB;  Service: Cardiovascular;  Laterality: N/A;  . CHEILECTOMY Left 03/15/2018   Procedure: CHEILECTOMY LEFT FOOT;  Surgeon: Sheral Apley, MD;  Location: [facility];  Service: Orthopedics;  Laterality: Left;  . COLONOSCOPY    . EP IMPLANTABLE DEVICE N/A 09/09/2015   Procedure: BiV Pacemaker Insertion CRT-P;  Surgeon: Will Jorja Loa, MD;  Location: [facility]VAS[facility] CV LAB;  Service: Cardiovascular;  Laterality: N/A;  . KNEE SURGERY Right   . TOTAL KNEE ARTHROPLASTY Right 07/11/2016  . TOTAL KNEE ARTHROPLASTY Right 07/11/2016   Procedure: TOTAL KNEE ARTHROPLASTY;  Surgeon: Sheral Apley, MD;  Location: [facility] OR;  Service: Orthopedics;  Laterality: Right;    Current Outpatient Medications  Medication Sig Dispense Refill  . acetaminophen (TYLENOL) 650 MG CR tablet Take 650 mg by mouth every 8 (eight) hours as needed for pain.    Marland Kitchen apixaban (ELIQUIS) 5 MG TABS tablet Take 1 tablet (  5 mg total) by mouth 2 (two) times daily. 60 tablet 3  . atorvastatin (LIPITOR) 40 MG tablet TAKE 1 TABLET BY MOUTH EVERY DAY AT 6PM 90 tablet 3  . carvedilol (COREG) 12.5 MG tablet TAKE 1 TABLET BY MOUTH TWICE A DAY 180 tablet 3  . ibuprofen (ADVIL) 200 MG tablet Take 200 mg by mouth every 6 (six) hours as needed.    Marland Kitchen lisinopril (ZESTRIL) 40 MG tablet TAKE 1 TABLET BY MOUTH EVERY DAY 90 tablet 3   No current  facility-administered medications for this encounter.    No Known Allergies  Social History   Socioeconomic History  . Marital status: Married    Spouse name: Not on file  . Number of children: Not on file  . Years of education: Not on file  . Highest education level: Not on file  Occupational History  . Occupation: truck Hospital doctor  Tobacco Use  . Smoking status: Former Smoker    Years: 6.00    Types: Cigars  . Smokeless tobacco: Current User    Types: Snuff  Substance and Sexual Activity  . Alcohol use: Yes    Alcohol/week: 1.0 - 2.0 standard drink    Types: 1 - 2 Cans of beer per week    Comment: occ  . Drug use: No  . Sexual activity: Yes  Other Topics Concern  . Not on file  Social History Narrative   Marital status: Married x 30 years; first marriage     Children: 2 children; no grandchildren     Lives: lives with wife      Employment: truck driver x 14 years; [place].        Tobacco:  None; pipes and cigars; chews tobacco.       Alcohol:  Weekends.      Drugs:  None      Exercise:  Rarely.      Seatbelt:  100%      Guns:  Loaded and secured.   Social Determinants of Health   Financial Resource Strain:   . Difficulty of Paying Living Expenses: Not on file  Food Insecurity:   . Worried About Programme researcher, broadcasting/film/video in the Last Year: Not on file  . Ran Out of Food in the Last Year: Not on file  Transportation Needs:   . Lack of Transportation (Medical): Not on file  . Lack of Transportation (Non-Medical): Not on file  Physical Activity:   . Days of Exercise per Week: Not on file  . Minutes of Exercise per Session: Not on file  Stress:   . Feeling of Stress : Not on file  Social Connections:   . Frequency of Communication with Friends and Family: Not on file  . Frequency of Social Gatherings with Friends and Family: Not on file  . Attends Religious Services: Not on file  . Active Member of Clubs or Organizations: Not on file  . Attends Banker  Meetings: Not on file  . Marital Status: Not on file  Intimate Partner Violence:   . Fear of Current or Ex-Partner: Not on file  . Emotionally Abused: Not on file  . Physically Abused: Not on file  . Sexually Abused: Not on file     ROS- All systems are reviewed and negative except as per the HPI above.  Physical Exam: Vitals:   12/29/19 1342  BP: 118/82  Pulse: 79  Weight: 129.2 kg  Height: 5\' 11"  (1.803 m)    GEN-  The patient is well appearing obese male, alert and oriented x 3 today.   Head- normocephalic, atraumatic Eyes-  Sclera clear, conjunctiva pink Ears- hearing intact Oropharynx- clear Neck- supple  Lungs- Clear to ausculation bilaterally, normal work of breathing Heart- Regular rate and rhythm, no murmurs, rubs or gallops  GI- soft, NT, ND, + BS Extremities- no clubbing, cyanosis, or edema MS- no significant deformity or atrophy Skin- no rash or lesion Psych- euthymic mood, full affect Neuro- strength and sensation are intact  Wt Readings from Last 3 Encounters:  12/29/19 129.2 kg  05/19/19 (!) 137.4 kg  02/27/19 (!) 136.2 kg    EKG today demonstrates atrial flutter with 3:1 block HR 79, LBBB, PR 180, QRS 156, QTc 472  Echo 02/01/18 demonstrated  - Left ventricle: Abnormal septal motion. The cavity size was  normal. Wall thickness was increased in a pattern of mild LVH.  Systolic function was normal. The estimated ejection fraction was  in the range of 55% to 60%. Wall motion was normal; there were no  regional wall motion abnormalities. Doppler parameters are  consistent with abnormal left ventricular relaxation (grade 1  diastolic dysfunction).  - Left atrium: The atrium was mildly dilated.  - Atrial septum: No defect or patent foramen ovale was identified.  Epic records are reviewed at length today  CHA2DS2-VASc Score = 4  The patient's score is based upon: CHF History: 1 HTN History: 1 Age : 1 Diabetes History: 0 Stroke History:  0 Vascular Disease History: 1 Gender: 0      ASSESSMENT AND PLAN: 1. Paroxysmal Atrial Fibrillation/atrial flutter The patient's CHA2DS2-VASc score is 4, indicating a 4.8% annual risk of stroke.   General education about afib and atrial flutter provided and questions answered. We also discussed his stroke risk and the risks and benefits of anticoagulation. Continue Eliquis 5 mg BID Check bmet/CBC/TSH today. Will plan on DCCV after 3 weeks of uninterrupted anticoagulation.   2. Secondary Hypercoagulable State (ICD10:  D68.69) The patient is at significant risk for stroke/thromboembolism based upon his CHA2DS2-VASc Score of 4.  Continue Apixaban (Eliquis).   3. Obesity Body mass index is 39.72 kg/m. Lifestyle modification was discussed at length including regular exercise and weight reduction.  4. Obstructive sleep apnea The importance of adequate treatment of sleep apnea was discussed today in order to improve our ability to maintain sinus rhythm long term. Patient reports compliance with CPAP therapy.  5. HTN Stable, no changes today.  6. CAD No anginal symptoms.  7. H/o NICM EF recovered to 55-60 on echo 2019. No signs or symptoms of fluid overload.   Follow up in the the AF clinic in 3 weeks.   Jorja Loa PA-C Af[facility] [address] [place], [place] [address] [phone] 12/29/2019 3:58 PM

## 2019-12-30 DIAGNOSIS — Z23 Encounter for immunization: Secondary | ICD-10-CM | POA: Diagnosis not present

## 2020-01-05 ENCOUNTER — Telehealth (HOSPITAL_COMMUNITY): Payer: Self-pay | Admitting: Physician Assistant

## 2020-01-05 NOTE — Telephone Encounter (Signed)
Called and left message for patient to call back.  Need to reschedule his appt 01/19/20 at 1:30 pm as Clide Cliff will be out of the office that afternoon.

## 2020-01-19 ENCOUNTER — Ambulatory Visit (HOSPITAL_COMMUNITY)
Admission: RE | Admit: 2020-01-19 | Discharge: 2020-01-19 | Disposition: A | Discharge status: Home / Self Care | Payer: Medicare Other | Source: Ambulatory Visit | Attending: Physician Assistant | Admitting: Physician Assistant

## 2020-01-19 ENCOUNTER — Encounter (HOSPITAL_COMMUNITY): Payer: Self-pay | Admitting: Physician Assistant

## 2020-01-19 ENCOUNTER — Other Ambulatory Visit: Payer: Self-pay

## 2020-01-19 ENCOUNTER — Other Ambulatory Visit (HOSPITAL_COMMUNITY): Payer: Self-pay | Admitting: *Deleted

## 2020-01-19 VITALS — BP 142/84 | HR 70 | Ht 71.0 in | Wt 287.8 lb

## 2020-01-19 DIAGNOSIS — I484 Atypical atrial flutter: Secondary | ICD-10-CM | POA: Diagnosis not present

## 2020-01-19 DIAGNOSIS — G4733 Obstructive sleep apnea (adult) (pediatric): Secondary | ICD-10-CM | POA: Diagnosis not present

## 2020-01-19 DIAGNOSIS — Z87891 Personal history of nicotine dependence: Secondary | ICD-10-CM | POA: Insufficient documentation

## 2020-01-19 DIAGNOSIS — E669 Obesity, unspecified: Secondary | ICD-10-CM | POA: Insufficient documentation

## 2020-01-19 DIAGNOSIS — Z7901 Long term (current) use of anticoagulants: Secondary | ICD-10-CM | POA: Diagnosis not present

## 2020-01-19 DIAGNOSIS — Z6841 Body Mass Index (BMI) 40.0 and over, adult: Secondary | ICD-10-CM | POA: Insufficient documentation

## 2020-01-19 DIAGNOSIS — D6869 Other thrombophilia: Secondary | ICD-10-CM | POA: Diagnosis not present

## 2020-01-19 DIAGNOSIS — I2510 Atherosclerotic heart disease of native coronary artery without angina pectoris: Secondary | ICD-10-CM | POA: Insufficient documentation

## 2020-01-19 DIAGNOSIS — I4892 Unspecified atrial flutter: Secondary | ICD-10-CM | POA: Insufficient documentation

## 2020-01-19 DIAGNOSIS — I480 Paroxysmal atrial fibrillation: Secondary | ICD-10-CM | POA: Insufficient documentation

## 2020-01-19 DIAGNOSIS — I10 Essential (primary) hypertension: Secondary | ICD-10-CM | POA: Insufficient documentation

## 2020-01-19 LAB — BASIC METABOLIC PANEL
Anion gap: 8 (ref 5–15)
BUN: 14 mg/dL (ref 8–23)
CO2: 27 mmol/L (ref 22–32)
Calcium: 9.5 mg/dL (ref 8.9–10.3)
Chloride: 105 mmol/L (ref 98–111)
Creatinine, Ser: 1.33 mg/dL — ABNORMAL HIGH (ref 0.61–1.24)
GFR, Estimated: 58 mL/min — ABNORMAL LOW (ref >60)
Glucose, Bld: 106 mg/dL — ABNORMAL HIGH (ref 70–99)
Potassium: 4.8 mmol/L (ref 3.5–5.1)
Sodium: 140 mmol/L (ref 135–145)

## 2020-01-19 LAB — CBC
HCT: 45.1 % (ref 39.0–52.0)
Hemoglobin: 14.9 g/dL (ref 13.0–17.0)
MCH: 30 pg (ref 26.0–34.0)
MCHC: 33 g/dL (ref 30.0–36.0)
MCV: 90.7 fL (ref 80.0–100.0)
Platelets: 172 10*3/uL (ref 150–400)
RBC: 4.97 MIL/uL (ref 4.22–5.81)
RDW: 12.4 % (ref 11.5–15.5)
WBC: 6.5 10*3/uL (ref 4.0–10.5)
nRBC: 0 % (ref 0.0–0.2)

## 2020-01-19 NOTE — Progress Notes (Signed)
Primary Care Physician: Donita Brooks, MD Primary Cardiologist: Dr Duke Salvia (remotely) Primary Electrophysiologist: Dr Elberta Fortis Referring Physician: Dr Bartolo D[facility] clinic   Harold Robertson is a 69 y.o. male with a history of HTN, OSA, CHB s/p PPM, non obstructive CAD LHC 2017, NICM, and atrial fibrillation who presents for follow up in the [facility].  The patient was initially diagnosed with atrial fibrillation on a device alert 12/24/19. The strips were reviewed by Dr Ladona Ridgel who stopped ASA and started Eliquis. Patient did have some fatigue and mild dyspnea with exertion but was otherwise unaware of his arrhythmia. Patient is on Eliquis for a CHADS2VASC score of 4. He reports compliance with his CPAP therapy and denies significant alcohol use.   On follow up today, patient reports he has done reasonably well since his last visit. He is tolerating anticoagulation without bleeding issues. He remains in rate controlled atrial flutter today.   Today, he denies symptoms of palpitations, chest pain, orthopnea, PND, lower extremity edema, dizziness, presyncope, syncope, bleeding, or neurologic sequela. The patient is tolerating medications without difficulties and is otherwise without complaint today.    Atrial Fibrillation Risk Factors:  he does have symptoms or diagnosis of sleep apnea. he is compliant with CPAP therapy. he does not have a history of rheumatic fever. he does not have a history of alcohol use. The patient does not have a history of early familial atrial fibrillation or other arrhythmias.  he has a BMI of Body mass index is 40.14 kg/m.Marland Kitchen Filed Weights   01/19/20 1105  Weight: 130.5 kg    Family History  Problem Relation Age of Onset  . Heart disease Father 84       AMI  . Liver cancer Mother   . Cancer Mother 59       Colon cancer with liver mets  . Heart disease Brother 50       AMI  . Heart disease Maternal Grandmother       Atrial Fibrillation Management history:  Previous antiarrhythmic drugs: none Previous cardioversions: none Previous ablations: none CHADS2VASC score: 4 Anticoagulation history: Eliquis   Past Medical History:  Diagnosis Date  . Arthritis   . Complete heart block (HCC)   . Hypertension   . OSA on CPAP   . Presence of permanent cardiac pacemaker    Past Surgical History:  Procedure Laterality Date  . APPENDECTOMY  1960s  . CARDIAC CATHETERIZATION Right 09/08/2015   Procedure: Temporary Pacemaker;  Surgeon: Will Jorja Loa, MD;  Location: [facility] INV[facility] CV LAB;  Service: Cardiovascular;  Laterality: Right;  . CARDIAC CATHETERIZATION N/A 09/09/2015   Procedure: Left Heart Cath and Coronary Angiography;  Surgeon: Peter M Swaziland, MD;  Location: [facility] INVASIVE CV LAB;  Service: Cardiovascular;  Laterality: N/A;  . CHEILECTOMY Left 03/15/2018   Procedure: CHEILECTOMY LEFT FOOT;  Surgeon: Sheral Apley, MD;  Location: [facility];  Service: Orthopedics;  Laterality: Left;  . COLONOSCOPY    . EP IMPLANTABLE DEVICE N/A 09/09/2015   Procedure: BiV Pacemaker Insertion CRT-P;  Surgeon: Will Jorja Loa, MD;  Location: [facility] INVASIVE CV LAB;  Service: Cardiovascular;  Laterality: N/A;  . KNEE SURGERY Right   . TOTAL KNEE ARTHROPLASTY Right 07/11/2016  . TOTAL KNEE ARTHROPLASTY Right 07/11/2016   Procedure: TOTAL KNEE ARTHROPLASTY;  Surgeon: Sheral Apley, MD;  Location: [facility] OR;  Service: Orthopedics;  Laterality: Right;    Current Outpatient Medications  Medication Sig Dispense Refill  .  acetaminophen (TYLENOL) 650 MG CR tablet Take 650 mg by mouth every 8 (eight) hours as needed for pain.    Marland Kitchen apixaban (ELIQUIS) 5 MG TABS tablet Take 1 tablet (5 mg total) by mouth 2 (two) times daily. 60 tablet 3  . atorvastatin (LIPITOR) 40 MG tablet TAKE 1 TABLET BY MOUTH EVERY DAY AT 6PM 90 tablet 3  . carvedilol (COREG) 12.5 MG tablet TAKE 1 TABLET BY MOUTH TWICE A DAY 180 tablet 3   . ibuprofen (ADVIL) 200 MG tablet Take 200 mg by mouth every 6 (six) hours as needed.    Marland Kitchen lisinopril (ZESTRIL) 40 MG tablet TAKE 1 TABLET BY MOUTH EVERY DAY 90 tablet 3   No current facility-administered medications for this encounter.    No Known Allergies  Social History   Socioeconomic History  . Marital status: Married    Spouse name: Not on file  . Number of children: Not on file  . Years of education: Not on file  . Highest education level: Not on file  Occupational History  . Occupation: truck Hospital doctor  Tobacco Use  . Smoking status: Former Smoker    Years: 6.00    Types: Cigars  . Smokeless tobacco: Current User    Types: Snuff  Substance and Sexual Activity  . Alcohol use: Yes    Alcohol/week: 1.0 - 2.0 standard drink    Types: 1 - 2 Cans of beer per week    Comment: occ  . Drug use: No  . Sexual activity: Yes  Other Topics Concern  . Not on file  Social History Narrative   Marital status: Married x 30 years; first marriage     Children: 2 children; no grandchildren     Lives: lives with wife      Employment: truck driver x 14 years; [place].        Tobacco:  None; pipes and cigars; chews tobacco.       Alcohol:  Weekends.      Drugs:  None      Exercise:  Rarely.      Seatbelt:  100%      Guns:  Loaded and secured.   Social Determinants of Health   Financial Resource Strain:   . Difficulty of Paying Living Expenses: Not on file  Food Insecurity:   . Worried About Programme researcher, broadcasting/film/video in the Last Year: Not on file  . Ran Out of Food in the Last Year: Not on file  Transportation Needs:   . Lack of Transportation (Medical): Not on file  . Lack of Transportation (Non-Medical): Not on file  Physical Activity:   . Days of Exercise per Week: Not on file  . Minutes of Exercise per Session: Not on file  Stress:   . Feeling of Stress : Not on file  Social Connections:   . Frequency of Communication with Friends and Family: Not on file  . Frequency of  Social Gatherings with Friends and Family: Not on file  . Attends Religious Services: Not on file  . Active Member of Clubs or Organizations: Not on file  . Attends Banker Meetings: Not on file  . Marital Status: Not on file  Intimate Partner Violence:   . Fear of Current or Ex-Partner: Not on file  . Emotionally Abused: Not on file  . Physically Abused: Not on file  . Sexually Abused: Not on file     ROS- All systems are reviewed and negative except as  per the HPI above.  Physical Exam: Vitals:   01/19/20 1105  BP: (!) 142/84  Pulse: 70  Weight: 130.5 kg  Height: 5\' 11"  (1.803 m)    GEN- The patient is well appearing obese male, alert and oriented x 3 today.   HEENT-head normocephalic, atraumatic, sclera clear, conjunctiva pink, hearing intact, trachea midline. Lungs- Clear to ausculation bilaterally, normal work of breathing Heart- irregular rate and rhythm, no murmurs, rubs or gallops  GI- soft, NT, ND, + BS Extremities- no clubbing, cyanosis, or edema MS- no significant deformity or atrophy Skin- no rash or lesion Psych- euthymic mood, full affect Neuro- strength and sensation are intact   Wt Readings from Last 3 Encounters:  01/19/20 130.5 kg  12/29/19 129.2 kg  05/19/19 (!) 137.4 kg    EKG today demonstrates V paced rhythm with underlying atrial flutter HR 70, QRS 164, QTc 501  Echo 02/01/18 demonstrated  - Left ventricle: Abnormal septal motion. The cavity size was  normal. Wall thickness was increased in a pattern of mild LVH.  Systolic function was normal. The estimated ejection fraction was  in the range of 55% to 60%. Wall motion was normal; there were no  regional wall motion abnormalities. Doppler parameters are  consistent with abnormal left ventricular relaxation (grade 1  diastolic dysfunction).  - Left atrium: The atrium was mildly dilated.  - Atrial septum: No defect or patent foramen ovale was identified.  Epic records  are reviewed at length today  CHA2DS2-VASc Score = 4  The patient's score is based upon: CHF History: 1 HTN History: 1 Diabetes History: 0 Stroke History: 0 Vascular Disease History: 1      ASSESSMENT AND PLAN: 1. Paroxysmal Atrial Fibrillation/atrial flutter The patient's CHA2DS2-VASc score is 4, indicating a 4.8% annual risk of stroke.   We discussed therapeutic options today. Will arrange for DCCV now that he has been on anticoagulation for 3 weeks.  Check bmet/CBC Continue Eliquis 5 mg BID  2. Secondary Hypercoagulable State (ICD10:  D68.69) The patient is at significant risk for stroke/thromboembolism based upon his CHA2DS2-VASc Score of 4.  Continue Apixaban (Eliquis).   3. Obesity Body mass index is 40.14 kg/m. Lifestyle modification was discussed and encouraged including regular physical activity and weight reduction.  4. Obstructive sleep apnea Patient reports compliance with CPAP therapy.  5. HTN Stable, no anginal symptoms.  6. CAD No anginal symptoms.  7. H/o NICM EF recovered to 55-60 on echo 2019. No signs or symptoms of fluid overload.   Follow up in the AF clinic one week post DCCV.    2020 PA-C Af[facility] [address] [place], [place] [phone] 01/19/2020 11:22 AM

## 2020-01-19 NOTE — H&P (View-Only) (Signed)
Primary Care Physician: Donita Brooks, MD Primary Cardiologist: Dr Duke Salvia (remotely) Primary Electrophysiologist: Dr Elberta Fortis Referring Physician: Dr Bartolo Darter clinic   Harold Robertson is a 69 y.o. male with a history of HTN, OSA, CHB s/p PPM, non obstructive CAD LHC 2017, NICM, and atrial fibrillation who presents for follow up in the [facility].  The patient was initially diagnosed with atrial fibrillation on a device alert 12/24/19. The strips were reviewed by Dr Ladona Ridgel who stopped ASA and started Eliquis. Patient did have some fatigue and mild dyspnea with exertion but was otherwise unaware of his arrhythmia. Patient is on Eliquis for a CHADS2VASC score of 4. He reports compliance with his CPAP therapy and denies significant alcohol use.   On follow up today, patient reports he has done reasonably well since his last visit. He is tolerating anticoagulation without bleeding issues. He remains in rate controlled atrial flutter today.   Today, he denies symptoms of palpitations, chest pain, orthopnea, PND, lower extremity edema, dizziness, presyncope, syncope, bleeding, or neurologic sequela. The patient is tolerating medications without difficulties and is otherwise without complaint today.    Atrial Fibrillation Risk Factors:  he does have symptoms or diagnosis of sleep apnea. he is compliant with CPAP therapy. he does not have a history of rheumatic fever. he does not have a history of alcohol use. The patient does not have a history of early familial atrial fibrillation or other arrhythmias.  he has a BMI of Body mass index is 40.14 kg/m.Marland Kitchen Filed Weights   01/19/20 1105  Weight: 130.5 kg    Family History  Problem Relation Age of Onset  . Heart disease Father 84       AMI  . Liver cancer Mother   . Cancer Mother 59       Colon cancer with liver mets  . Heart disease Brother 50       AMI  . Heart disease Maternal Grandmother       Atrial Fibrillation Management history:  Previous antiarrhythmic drugs: none Previous cardioversions: none Previous ablations: none CHADS2VASC score: 4 Anticoagulation history: Eliquis   Past Medical History:  Diagnosis Date  . Arthritis   . Complete heart block (HCC)   . Hypertension   . OSA on CPAP   . Presence of permanent cardiac pacemaker    Past Surgical History:  Procedure Laterality Date  . APPENDECTOMY  1960s  . CARDIAC CATHETERIZATION Right 09/08/2015   Procedure: Temporary Pacemaker;  Surgeon: Will Jorja Loa, MD;  Location: [facility] INVAS[facility] CV LAB;  Service: Cardiovascular;  Laterality: Right;  . CARDIAC CATHETERIZATION N/A 09/09/2015   Procedure: Left Heart Cath and Coronary Angiography;  Surgeon: Peter M Swaziland, MD;  Location: [facility] INVASIVE CV LAB;  Service: Cardiovascular;  Laterality: N/A;  . CHEILECTOMY Left 03/15/2018   Procedure: CHEILECTOMY LEFT FOOT;  Surgeon: Sheral Apley, MD;  Location: [facility];  Service: Orthopedics;  Laterality: Left;  . COLONOSCOPY    . EP IMPLANTABLE DEVICE N/A 09/09/2015   Procedure: BiV Pacemaker Insertion CRT-P;  Surgeon: Will Jorja Loa, MD;  Location: [facility] CV [facility];  Service: Cardiovascular;  Laterality: N/A;  . KNEE SURGERY Right   . TOTAL KNEE ARTHROPLASTY Right 07/11/2016  . TOTAL KNEE ARTHROPLASTY Right 07/11/2016   Procedure: TOTAL KNEE ARTHROPLASTY;  Surgeon: Sheral Apley, MD;  Location: [facility] OR;  Service: Orthopedics;  Laterality: Right;    Current Outpatient Medications  Medication Sig Dispense Refill  .  acetaminophen (TYLENOL) 650 MG CR tablet Take 650 mg by mouth every 8 (eight) hours as needed for pain.    Marland Kitchen apixaban (ELIQUIS) 5 MG TABS tablet Take 1 tablet (5 mg total) by mouth 2 (two) times daily. 60 tablet 3  . atorvastatin (LIPITOR) 40 MG tablet TAKE 1 TABLET BY MOUTH EVERY DAY AT 6PM 90 tablet 3  . carvedilol (COREG) 12.5 MG tablet TAKE 1 TABLET BY MOUTH TWICE A DAY 180 tablet 3   . ibuprofen (ADVIL) 200 MG tablet Take 200 mg by mouth every 6 (six) hours as needed.    Marland Kitchen lisinopril (ZESTRIL) 40 MG tablet TAKE 1 TABLET BY MOUTH EVERY DAY 90 tablet 3   No current facility-administered medications for this encounter.    No Known Allergies  Social History   Socioeconomic History  . Marital status: Married    Spouse name: Not on file  . Number of children: Not on file  . Years of education: Not on file  . Highest education level: Not on file  Occupational History  . Occupation: truck Hospital doctor  Tobacco Use  . Smoking status: Former Smoker    Years: 6.00    Types: Cigars  . Smokeless tobacco: Current User    Types: Snuff  Substance and Sexual Activity  . Alcohol use: Yes    Alcohol/week: 1.0 - 2.0 standard drink    Types: 1 - 2 Cans of beer per week    Comment: occ  . Drug use: No  . Sexual activity: Yes  Other Topics Concern  . Not on file  Social History Narrative   Marital status: Married x 30 years; first marriage     Children: 2 children; no grandchildren     Lives: lives with wife      Employment: truck driver x 14 years; [place].        Tobacco:  None; pipes and cigars; chews tobacco.       Alcohol:  Weekends.      Drugs:  None      Exercise:  Rarely.      Seatbelt:  100%      Guns:  Loaded and secured.   Social Determinants of Health   Financial Resource Strain:   . Difficulty of Paying Living Expenses: Not on file  Food Insecurity:   . Worried About Programme researcher, broadcasting/film/video in the Last Year: Not on file  . Ran Out of Food in the Last Year: Not on file  Transportation Needs:   . Lack of Transportation (Medical): Not on file  . Lack of Transportation (Non-Medical): Not on file  Physical Activity:   . Days of Exercise per Week: Not on file  . Minutes of Exercise per Session: Not on file  Stress:   . Feeling of Stress : Not on file  Social Connections:   . Frequency of Communication with Friends and Family: Not on file  . Frequency of  Social Gatherings with Friends and Family: Not on file  . Attends Religious Services: Not on file  . Active Member of Clubs or Organizations: Not on file  . Attends Banker Meetings: Not on file  . Marital Status: Not on file  Intimate Partner Violence:   . Fear of Current or Ex-Partner: Not on file  . Emotionally Abused: Not on file  . Physically Abused: Not on file  . Sexually Abused: Not on file     ROS- All systems are reviewed and negative except as  per the HPI above.  Physical Exam: Vitals:   01/19/20 1105  BP: (!) 142/84  Pulse: 70  Weight: 130.5 kg  Height: 5\' 11"  (1.803 m)    GEN- The patient is well appearing obese male, alert and oriented x 3 today.   HEENT-head normocephalic, atraumatic, sclera clear, conjunctiva pink, hearing intact, trachea midline. Lungs- Clear to ausculation bilaterally, normal work of breathing Heart- irregular rate and rhythm, no murmurs, rubs or gallops  GI- soft, NT, ND, + BS Extremities- no clubbing, cyanosis, or edema MS- no significant deformity or atrophy Skin- no rash or lesion Psych- euthymic mood, full affect Neuro- strength and sensation are intact   Wt Readings from Last 3 Encounters:  01/19/20 130.5 kg  12/29/19 129.2 kg  05/19/19 (!) 137.4 kg    EKG today demonstrates V paced rhythm with underlying atrial flutter HR 70, QRS 164, QTc 501  Echo 02/01/18 demonstrated  - Left ventricle: Abnormal septal motion. The cavity size was  normal. Wall thickness was increased in a pattern of mild LVH.  Systolic function was normal. The estimated ejection fraction was  in the range of 55% to 60%. Wall motion was normal; there were no  regional wall motion abnormalities. Doppler parameters are  consistent with abnormal left ventricular relaxation (grade 1  diastolic dysfunction).  - Left atrium: The atrium was mildly dilated.  - Atrial septum: No defect or patent foramen ovale was identified.  Epic records  are reviewed at length today  CHA2DS2-VASc Score = 4  The patient's score is based upon: CHF History: 1 HTN History: 1 Diabetes History: 0 Stroke History: 0 Vascular Disease History: 1      ASSESSMENT AND PLAN: 1. Paroxysmal Atrial Fibrillation/atrial flutter The patient's CHA2DS2-VASc score is 4, indicating a 4.8% annual risk of stroke.   We discussed therapeutic options today. Will arrange for DCCV now that he has been on anticoagulation for 3 weeks.  Check bmet/CBC Continue Eliquis 5 mg BID  2. Secondary Hypercoagulable State (ICD10:  D68.69) The patient is at significant risk for stroke/thromboembolism based upon his CHA2DS2-VASc Score of 4.  Continue Apixaban (Eliquis).   3. Obesity Body mass index is 40.14 kg/m. Lifestyle modification was discussed and encouraged including regular physical activity and weight reduction.  4. Obstructive sleep apnea Patient reports compliance with CPAP therapy.  5. HTN Stable, no anginal symptoms.  6. CAD No anginal symptoms.  7. H/o NICM EF recovered to 55-60 on echo 2019. No signs or symptoms of fluid overload.   Follow up in the AF clinic one week post DCCV.    2020 PA-C Af[facility] [address] [place], [place] [phone] 01/19/2020 11:22 AM

## 2020-01-26 ENCOUNTER — Other Ambulatory Visit
Admission: RE | Admit: 2020-01-26 | Discharge: 2020-01-26 | Disposition: A | Discharge status: Home / Self Care | Payer: Medicare Other | Source: Ambulatory Visit | Attending: Cardiology | Admitting: Cardiology

## 2020-01-26 ENCOUNTER — Other Ambulatory Visit: Payer: Self-pay

## 2020-01-26 DIAGNOSIS — Z20822 Contact with and (suspected) exposure to covid-19: Secondary | ICD-10-CM | POA: Insufficient documentation

## 2020-01-26 DIAGNOSIS — Z01818 Encounter for other preprocedural examination: Secondary | ICD-10-CM | POA: Insufficient documentation

## 2020-01-26 LAB — SARS CORONAVIRUS 2 (TAT 6-24 HRS): SARS Coronavirus 2: NEGATIVE

## 2020-01-28 ENCOUNTER — Other Ambulatory Visit: Payer: Self-pay

## 2020-01-28 ENCOUNTER — Ambulatory Visit (HOSPITAL_COMMUNITY): Payer: Medicare Other | Admitting: Anesthesiology

## 2020-01-28 ENCOUNTER — Ambulatory Visit (HOSPITAL_COMMUNITY)
Admission: RE | Admit: 2020-01-28 | Discharge: 2020-01-28 | Disposition: A | Discharge status: Home / Self Care | Payer: Medicare Other | Attending: Cardiology | Admitting: Cardiology

## 2020-01-28 ENCOUNTER — Encounter (HOSPITAL_COMMUNITY)
Admission: RE | Disposition: A | Discharge status: Home / Self Care | Payer: Self-pay | Source: Home / Self Care | Attending: Cardiology

## 2020-01-28 DIAGNOSIS — F17290 Nicotine dependence, other tobacco product, uncomplicated: Secondary | ICD-10-CM | POA: Diagnosis not present

## 2020-01-28 DIAGNOSIS — I4892 Unspecified atrial flutter: Secondary | ICD-10-CM | POA: Diagnosis not present

## 2020-01-28 DIAGNOSIS — I428 Other cardiomyopathies: Secondary | ICD-10-CM | POA: Diagnosis not present

## 2020-01-28 DIAGNOSIS — I480 Paroxysmal atrial fibrillation: Secondary | ICD-10-CM | POA: Diagnosis not present

## 2020-01-28 DIAGNOSIS — I4891 Unspecified atrial fibrillation: Secondary | ICD-10-CM | POA: Diagnosis not present

## 2020-01-28 DIAGNOSIS — I10 Essential (primary) hypertension: Secondary | ICD-10-CM | POA: Insufficient documentation

## 2020-01-28 DIAGNOSIS — E669 Obesity, unspecified: Secondary | ICD-10-CM | POA: Diagnosis not present

## 2020-01-28 DIAGNOSIS — Z6841 Body Mass Index (BMI) 40.0 and over, adult: Secondary | ICD-10-CM | POA: Insufficient documentation

## 2020-01-28 DIAGNOSIS — I2510 Atherosclerotic heart disease of native coronary artery without angina pectoris: Secondary | ICD-10-CM | POA: Diagnosis not present

## 2020-01-28 DIAGNOSIS — G4733 Obstructive sleep apnea (adult) (pediatric): Secondary | ICD-10-CM | POA: Diagnosis not present

## 2020-01-28 DIAGNOSIS — D6869 Other thrombophilia: Secondary | ICD-10-CM | POA: Diagnosis not present

## 2020-01-28 DIAGNOSIS — M1711 Unilateral primary osteoarthritis, right knee: Secondary | ICD-10-CM | POA: Diagnosis not present

## 2020-01-28 HISTORY — PX: CARDIOVERSION: SHX1299

## 2020-01-28 SURGERY — CARDIOVERSION
Anesthesia: General

## 2020-01-28 MED ORDER — PROPOFOL 10 MG/ML IV BOLUS
INTRAVENOUS | Status: DC | PRN
  Administered 2020-01-28: 60 mg via INTRAVENOUS

## 2020-01-28 MED ORDER — SODIUM CHLORIDE 0.9 % IV SOLN: INTRAVENOUS | Status: DC | PRN

## 2020-01-28 MED ORDER — LIDOCAINE 2% (20 MG/ML) 5 ML SYRINGE
INTRAMUSCULAR | Status: DC | PRN
  Administered 2020-01-28: 40 mg via INTRAVENOUS

## 2020-01-28 NOTE — Anesthesia Preprocedure Evaluation (Addendum)
Anesthesia Evaluation  Patient identified by MRN, date of birth, ID band Patient awake    Reviewed: Allergy & Precautions, NPO status , Patient's Chart, lab work & pertinent test results  Airway Mallampati: II  TM Distance: >3 FB Neck ROM: Full    Dental  (+) Teeth Intact, Dental Advisory Given   Pulmonary sleep apnea , former smoker,    breath sounds clear to auscultation       Cardiovascular hypertension, Pt. on home beta blockers + dysrhythmias Atrial Fibrillation + pacemaker  Rhythm:Irregular Rate:Abnormal     Neuro/Psych negative neurological ROS  negative psych ROS   GI/Hepatic negative GI ROS, Neg liver ROS,   Endo/Other  negative endocrine ROS  Renal/GU Renal InsufficiencyRenal disease     Musculoskeletal  (+) Arthritis ,   Abdominal (+) + obese,   Peds  Hematology   Anesthesia Other Findings   Reproductive/Obstetrics                            Anesthesia Physical Anesthesia Plan  ASA: III  Anesthesia Plan: General   Post-op Pain Management:    Induction: Intravenous  PONV Risk Score and Plan: 0 and Propofol infusion  Airway Management Planned: Natural Airway and Simple Face Mask  Additional Equipment: None  Intra-op Plan:   Post-operative Plan:   Informed Consent: I have reviewed the patients History and Physical, chart, labs and discussed the procedure including the risks, benefits and alternatives for the proposed anesthesia with the patient or authorized representative who has indicated his/her understanding and acceptance.       Plan Discussed with: CRNA  Anesthesia Plan Comments: (Echo (2019):  - Left ventricle: Abnormal septal motion. The cavity size was  normal. Wall thickness was increased in a pattern of mild LVH.  Systolic function was normal. The estimated ejection fraction was  in the range of 55% to 60%. Wall motion was normal; there were no   regional wall motion abnormalities. Doppler parameters are  consistent with abnormal left ventricular relaxation (grade 1  diastolic dysfunction).  - Left atrium: The atrium was mildly dilated.  - Atrial septum: No defect or patent foramen ovale was identified. )       Anesthesia Quick Evaluation

## 2020-01-28 NOTE — CV Procedure (Signed)
    Electrical Cardioversion Procedure Note KAVONTE BEARSE [id number] 05-30-1950  Procedure: Electrical Cardioversion Indications:  Atrial Fibrillation  Time Out: Verified patient identification, verified procedure,medications/allergies/relevent history reviewed, required imaging and test results available.  Performed  Procedure Details  The patient was NPO after midnight. Anesthesia was administered at the beside  by Dr.Hollis with propofol.  Cardioversion was performed with synchronized biphasic defibrillation via AP pads with 120 joules.  1 attempt(s) were performed.  The patient converted to normal sinus rhythm. The patient tolerated the procedure well   IMPRESSION:  Successful cardioversion of atrial fibrillation St. Jude pacemaker interrogated to verify SR.    Donato Schultz 01/28/2020, 10:57 AM

## 2020-01-28 NOTE — Transfer of Care (Signed)
Immediate Anesthesia Transfer of Care Note  Patient: Harold Robertson  Procedure(s) Performed: CARDIOVERSION (N/A )  Patient Location: Endoscopy Unit  Anesthesia Type:General  Level of Consciousness: awake, alert  and oriented  Airway & Oxygen Therapy: Patient Spontanous Breathing  Post-op Assessment: Report given to RN, Post -op Vital signs reviewed and stable and Patient moving all extremities X 4  Post vital signs: Reviewed and stable  Last Vitals:  Vitals Value Taken Time  BP 148/89 01/28/20 1100  Temp    Pulse 59 01/28/20 1101  Resp 20 01/28/20 1101  SpO2 98 % 01/28/20 1101  Vitals shown include unvalidated device data.  Last Pain:  Vitals:   01/28/20 1008  PainSc: 0-No pain         Complications: No complications documented.

## 2020-01-28 NOTE — Anesthesia Procedure Notes (Signed)
Procedure Name: General with mask airway Date/Time: 01/28/2020 10:53 AM Performed by: Quentin Ore, CRNA Pre-anesthesia Checklist: Patient identified, Emergency Drugs available, Suction available, Patient being monitored and Timeout performed Patient Re-evaluated:Patient Re-evaluated prior to induction Oxygen Delivery Method: Ambu bag Preoxygenation: Pre-oxygenation with 100% oxygen Induction Type: IV induction Ventilation: Mask ventilation without difficulty Placement Confirmation: positive ETCO2 Dental Injury: Teeth and Oropharynx as per pre-operative assessment

## 2020-01-28 NOTE — Anesthesia Postprocedure Evaluation (Signed)
Anesthesia Post Note  Patient: QUASIM DOYON  Procedure(s) Performed: CARDIOVERSION (N/A )     Patient location during evaluation: PACU Anesthesia Type: General Level of consciousness: awake and alert Pain management: pain level controlled Vital Signs Assessment: post-procedure vital signs reviewed and stable Respiratory status: spontaneous breathing, nonlabored ventilation, respiratory function stable and patient connected to nasal cannula oxygen Cardiovascular status: blood pressure returned to baseline and stable Postop Assessment: no apparent nausea or vomiting Anesthetic complications: no   No complications documented.  Last Vitals:  Vitals:   01/28/20 1110 01/28/20 1120  BP: (!) 142/89 (!) 147/96  Pulse: (!) 59 (!) 58  Resp: 17 14  Temp:    SpO2: 99% 98%    Last Pain:  Vitals:   01/28/20 1120  TempSrc:   PainSc: 0-No pain                 Shelton Silvas

## 2020-01-28 NOTE — Interval H&P Note (Signed)
History and Physical Interval Note:  01/28/2020 10:44 AM  Harold Robertson  has presented today for surgery, with the diagnosis of A-FIB.  The various methods of treatment have been discussed with the patient and family. After consideration of risks, benefits and other options for treatment, the patient has consented to  Procedure(s): CARDIOVERSION (N/A) as a surgical intervention.  The patient's history has been reviewed, patient examined, no change in status, stable for surgery.  I have reviewed the patient's chart and labs.  Questions were answered to the patient's satisfaction.     [employer]

## 2020-01-28 NOTE — Discharge Instructions (Signed)
Electrical Cardioversion Electrical cardioversion is the delivery of a jolt of electricity to restore a normal rhythm to the heart. A rhythm that is too fast or is not regular keeps the heart from pumping well. In this procedure, sticky patches or metal paddles are placed on the chest to deliver electricity to the heart from a device. This procedure may be done in an emergency if:  There is low or no blood pressure as a result of the heart rhythm.  Normal rhythm must be restored as fast as possible to protect the brain and heart from further damage.  It may save a life. This may also be a scheduled procedure for irregular or fast heart rhythms that are not immediately life-threatening. Tell a health care provider about:  Any allergies you have.  All medicines you are taking, including vitamins, herbs, eye drops, creams, and over-the-counter medicines.  Any problems you or family members have had with anesthetic medicines.  Any blood disorders you have.  Any surgeries you have had.  Any medical conditions you have.  Whether you are pregnant or may be pregnant. What are the risks? Generally, this is a safe procedure. However, problems may occur, including:  Allergic reactions to medicines.  A blood clot that breaks free and travels to other parts of your body.  The possible return of an abnormal heart rhythm within hours or days after the procedure.  Your heart stopping (cardiac arrest). This is rare. What happens before the procedure? Medicines  Your health care provider may have you start taking: ? Blood-thinning medicines (anticoagulants) so your blood does not clot as easily. ? Medicines to help stabilize your heart rate and rhythm.  Ask your health care provider about: ? Changing or stopping your regular medicines. This is especially important if you are taking diabetes medicines or blood thinners. ? Taking medicines such as aspirin and ibuprofen. These medicines can  thin your blood. Do not take these medicines unless your health care provider tells you to take them. ? Taking over-the-counter medicines, vitamins, herbs, and supplements. General instructions  Follow instructions from your health care provider about eating or drinking restrictions.  Plan to have someone take you home from the hospital or clinic.  If you will be going home right after the procedure, plan to have someone with you for 24 hours.  Ask your health care provider what steps will be taken to help prevent infection. These may include washing your skin with a germ-killing soap. What happens during the procedure?   An IV will be inserted into one of your veins.  Sticky patches (electrodes) or metal paddles may be placed on your chest.  You will be given a medicine to help you relax (sedative).  An electrical shock will be delivered. The procedure may vary among health care providers and hospitals. What can I expect after the procedure?  Your blood pressure, heart rate, breathing rate, and blood oxygen level will be monitored until you leave the hospital or clinic.  Your heart rhythm will be watched to make sure it does not change.  You may have some redness on the skin where the shocks were given. Follow these instructions at home:  Do not drive for 24 hours if you were given a sedative during your procedure.  Take over-the-counter and prescription medicines only as told by your health care provider.  Ask your health care provider how to check your pulse. Check it often.  Rest for 48 hours after the procedure or   as told by your health care provider.  Avoid or limit your caffeine use as told by your health care provider.  Keep all follow-up visits as told by your health care provider. This is important. Contact a health care provider if:  You feel like your heart is beating too quickly or your pulse is not regular.  You have a serious muscle cramp that does not go  away. Get help right away if:  You have discomfort in your chest.  You are dizzy or you feel faint.  You have trouble breathing or you are short of breath.  Your speech is slurred.  You have trouble moving an arm or leg on one side of your body.  Your fingers or toes turn cold or blue. Summary  Electrical cardioversion is the delivery of a jolt of electricity to restore a normal rhythm to the heart.  This procedure may be done right away in an emergency or may be a scheduled procedure if the condition is not an emergency.  Generally, this is a safe procedure.  After the procedure, check your pulse often as told by your health care provider. This information is not intended to replace advice given to you by your health care provider. Make sure you discuss any questions you have with your health care provider. Document Revised: 10/14/2018 Document Reviewed: 10/14/2018 Elsevier Patient Education  2020 Elsevier Inc.  

## 2020-01-30 ENCOUNTER — Encounter (HOSPITAL_COMMUNITY): Payer: Self-pay | Admitting: Cardiology

## 2020-02-04 ENCOUNTER — Ambulatory Visit (HOSPITAL_COMMUNITY)
Admission: RE | Admit: 2020-02-04 | Discharge: 2020-02-04 | Disposition: A | Discharge status: Home / Self Care | Payer: Medicare Other | Source: Ambulatory Visit | Attending: Physician Assistant | Admitting: Physician Assistant

## 2020-02-04 ENCOUNTER — Other Ambulatory Visit: Payer: Self-pay

## 2020-02-04 VITALS — BP 166/102 | HR 60 | Ht 71.0 in | Wt 292.8 lb

## 2020-02-04 DIAGNOSIS — I10 Essential (primary) hypertension: Secondary | ICD-10-CM | POA: Insufficient documentation

## 2020-02-04 DIAGNOSIS — I480 Paroxysmal atrial fibrillation: Secondary | ICD-10-CM | POA: Diagnosis not present

## 2020-02-04 DIAGNOSIS — I484 Atypical atrial flutter: Secondary | ICD-10-CM

## 2020-02-04 DIAGNOSIS — I2510 Atherosclerotic heart disease of native coronary artery without angina pectoris: Secondary | ICD-10-CM | POA: Diagnosis not present

## 2020-02-04 DIAGNOSIS — Z6841 Body Mass Index (BMI) 40.0 and over, adult: Secondary | ICD-10-CM | POA: Insufficient documentation

## 2020-02-04 DIAGNOSIS — D6869 Other thrombophilia: Secondary | ICD-10-CM | POA: Insufficient documentation

## 2020-02-04 DIAGNOSIS — Z79899 Other long term (current) drug therapy: Secondary | ICD-10-CM | POA: Insufficient documentation

## 2020-02-04 DIAGNOSIS — Z7901 Long term (current) use of anticoagulants: Secondary | ICD-10-CM | POA: Diagnosis not present

## 2020-02-04 DIAGNOSIS — G4733 Obstructive sleep apnea (adult) (pediatric): Secondary | ICD-10-CM | POA: Diagnosis not present

## 2020-02-04 DIAGNOSIS — I4892 Unspecified atrial flutter: Secondary | ICD-10-CM | POA: Diagnosis not present

## 2020-02-04 DIAGNOSIS — Z8249 Family history of ischemic heart disease and other diseases of the circulatory system: Secondary | ICD-10-CM | POA: Insufficient documentation

## 2020-02-04 DIAGNOSIS — E669 Obesity, unspecified: Secondary | ICD-10-CM | POA: Diagnosis not present

## 2020-02-04 DIAGNOSIS — Z87891 Personal history of nicotine dependence: Secondary | ICD-10-CM | POA: Diagnosis not present

## 2020-02-04 NOTE — Progress Notes (Signed)
Primary Care Physician: Donita Brooks, MD Primary Cardiologist: Dr Duke Salvia (remotely) Primary Electrophysiologist: Dr Elberta Fortis Referring Physician: Dr Bartolo [facility]   Harold Robertson is a 69 y.o. male with a history of HTN, OSA, CHB s/p PPM, non obstructive CAD LHC 2017, NICM, and atrial fibrillation who presents for follow up in the [facility].  The patient was initially diagnosed with atrial fibrillation on a device alert 12/24/19. The strips were reviewed by Dr Ladona Ridgel who stopped ASA and started Eliquis. Patient did have some fatigue and mild dyspnea with exertion but was otherwise unaware of his arrhythmia. Patient is on Eliquis for a CHADS2VASC score of 4. He reports compliance with his CPAP therapy and denies significant alcohol use.   On follow up today, patient is s/p DCCV on 01/28/20. He tolerated the procedure well and is feeling less fatigue. He denies any bleeding issues on anticoagulation.   Today, he denies symptoms of palpitations, chest pain, orthopnea, PND, lower extremity edema, dizziness, presyncope, syncope, bleeding, or neurologic sequela. The patient is tolerating medications without difficulties and is otherwise without complaint today.    Atrial Fibrillation Risk Factors:  he does have symptoms or diagnosis of sleep apnea. he is compliant with CPAP therapy. he does not have a history of rheumatic fever. he does not have a history of alcohol use. The patient does not have a history of early familial atrial fibrillation or other arrhythmias.  he has a BMI of Body mass index is 40.84 kg/m.Marland Kitchen Filed Weights   02/04/20 1052  Weight: 132.8 kg    Family History  Problem Relation Age of Onset   Heart disease Father 36       AMI   Liver cancer Mother    Cancer Mother 42       Colon cancer with liver mets   Heart disease Brother 90       AMI   Heart disease Maternal Grandmother      Atrial Fibrillation Management  history:  Previous antiarrhythmic drugs: none Previous cardioversions: 01/28/20 Previous ablations: none CHADS2VASC score: 4 Anticoagulation history: Eliquis   Past Medical History:  Diagnosis Date   Arthritis    Complete heart block (HCC)    Hypertension    OSA on CPAP    Presence of permanent cardiac pacemaker    Past Surgical History:  Procedure Laterality Date   APPENDECTOMY  1960s   CARDIAC CATHETERIZATION Right 09/08/2015   Procedure: Temporary Pacemaker;  Surgeon: Will Jorja Loa, MD;  Location: [facility] INVASIVE CV LAB;  Service: Cardiovascular;  Laterality: Right;   CARDIAC CATHETERIZATION N/A 09/09/2015   Procedure: Left Heart Cath and Coronary Angiography;  Surgeon: Peter M Swaziland, MD;  Location: [facility] INVASIVE CV LAB;  Service: Cardiovascular;  Laterality: N/A;   CARDIOVERSION N/A 01/28/2020   Procedure: CARDIOVERSION;  Surgeon: Jake Bathe, MD;  Location: [facility] ENDOSCOPY;  Service: Cardiovascular;  Laterality: N/A;   CHEILECTOMY Left 03/15/2018   Procedure: CHEILECTOMY LEFT FOOT;  Surgeon: Sheral Apley, MD;  Location: [facility];  Service: Orthopedics;  Laterality: Left;   COLONOSCOPY     EP IMPLANTABLE DEVICE N/A 09/09/2015   Procedure: BiV Pacemaker Insertion CRT-P;  Surgeon: Will Jorja Loa, MD;  Location: [facility] INVASIVE CV LAB;  Service: Cardiovascular;  Laterality: N/A;   KNEE SURGERY Right    TOTAL KNEE ARTHROPLASTY Right 07/11/2016   TOTAL KNEE ARTHROPLASTY Right 07/11/2016   Procedure: TOTAL KNEE ARTHROPLASTY;  Surgeon: Sheral Apley, MD;  Location: [facility] OR;  Service: Orthopedics;  Laterality: Right;    Current Outpatient Medications  Medication Sig Dispense Refill   acetaminophen (TYLENOL) 650 MG CR tablet Take 650 mg by mouth every 8 (eight) hours as needed for pain.     apixaban (ELIQUIS) 5 MG TABS tablet Take 1 tablet (5 mg total) by mouth 2 (two) times daily. 60 tablet 3   atorvastatin (LIPITOR) 40 MG tablet TAKE 1 TABLET  BY MOUTH EVERY DAY AT 6PM (Patient taking differently: Take 40 mg by mouth daily. ) 90 tablet 3   carvedilol (COREG) 12.5 MG tablet TAKE 1 TABLET BY MOUTH TWICE A DAY (Patient taking differently: Take 12.5 mg by mouth in the morning and at bedtime. ) 180 tablet 3   cholecalciferol (VITAMIN D) 25 MCG (1000 UNIT) tablet Take 1,000 Units by mouth daily.     ibuprofen (ADVIL) 200 MG tablet Take 200 mg by mouth daily.     lisinopril (ZESTRIL) 40 MG tablet TAKE 1 TABLET BY MOUTH EVERY DAY (Patient taking differently: Take 40 mg by mouth daily. ) 90 tablet 3   No current facility-administered medications for this encounter.    No Known Allergies  Social History   Socioeconomic History   Marital status: Married    Spouse name: Not on file   Number of children: Not on file   Years of education: Not on file   Highest education level: Not on file  Occupational History   Occupation: truck driver  Tobacco Use   Smoking status: Former Smoker    Years: 6.00    Types: Cigars   Smokeless tobacco: Current User    Types: Snuff  Substance and Sexual Activity   Alcohol use: Yes    Alcohol/week: 1.0 - 2.0 standard drink    Types: 1 - 2 Cans of beer per week    Comment: occ   Drug use: No   Sexual activity: Yes  Other Topics Concern   Not on file  Social History Narrative   Marital status: Married x 30 years; first marriage     Children: 2 children; no grandchildren     Lives: lives with wife      Employment: truck driver x 14 years; [place]        Tobacco:  None; pipes and cigars; chews tobacco.       Alcohol:  Weekends.      Drugs:  None      Exercise:  Rarely.      Seatbelt:  100%      Guns:  Loaded and secured.   Social Determinants of Health   Financial Resource Strain:    Difficulty of Paying Living Expenses: Not on file  Food Insecurity:    Worried About Programme researcher, broadcasting/film/video in the Last Year: Not on file   [employer] of Food in the Last Year: Not on file    Transportation Needs:    Lack of Transportation (Medical): Not on file   Lack of Transportation (Non-Medical): Not on file  Physical Activity:    Days of Exercise per Week: Not on file   Minutes of Exercise per Session: Not on file  Stress:    Feeling of Stress : Not on file  Social Connections:    Frequency of Communication with Friends and Family: Not on file   Frequency of Social Gatherings with Friends and Family: Not on file   Attends Religious Services: Not on file   Active Member of Clubs  or Organizations: Not on file   Attends Banker Meetings: Not on file   Marital Status: Not on file  Intimate Partner Violence:    Fear of Current or Ex-Partner: Not on file   Emotionally Abused: Not on file   Physically Abused: Not on file   Sexually Abused: Not on file     ROS- All systems are reviewed and negative except as per the HPI above.  Physical Exam: Vitals:   02/04/20 1052  BP: (!) 166/102  Pulse: 60  Weight: 132.8 kg  Height: 5\' 11"  (1.803 m)    GEN- The patient is well appearing obese male, alert and oriented x 3 today.   HEENT-head normocephalic, atraumatic, sclera clear, conjunctiva pink, hearing intact, trachea midline. Lungs- Clear to ausculation bilaterally, normal work of breathing Heart- Regular rate and rhythm, no murmurs, rubs or gallops  GI- soft, NT, ND, + BS Extremities- no clubbing, cyanosis, or edema MS- no significant deformity or atrophy Skin- no rash or lesion Psych- euthymic mood, full affect Neuro- strength and sensation are intact   Wt Readings from Last 3 Encounters:  02/04/20 132.8 kg  01/28/20 127 kg  01/19/20 130.5 kg    EKG today demonstrates A sense V paced rhythm HR 60, PR 168, QRS 152, QTc 468  Echo 02/01/18 demonstrated  - Left ventricle: Abnormal septal motion. The cavity size was  normal. Wall thickness was increased in a pattern of mild LVH.  Systolic function was normal. The estimated  ejection fraction was  in the range of 55% to 60%. Wall motion was normal; there were no  regional wall motion abnormalities. Doppler parameters are  consistent with abnormal left ventricular relaxation (grade 1  diastolic dysfunction).  - Left atrium: The atrium was mildly dilated.  - Atrial septum: No defect or patent foramen ovale was identified.  Epic records are reviewed at length today  CHA2DS2-VASc Score = 4  The patient's score is based upon: CHF History: 1 HTN History: 1 Diabetes History: 0 Stroke History: 0 Vascular Disease History: 1      ASSESSMENT AND PLAN: 1. Paroxysmal Atrial Fibrillation/atrial flutter The patient's CHA2DS2-VASc score is 4, indicating a 4.8% annual risk of stroke.   S/p DCCV on 01/28/20 Patient appears to be maintaining SR. Continue Eliquis 5 mg BID Can consider AAD if his afib becomes more persistent.   2. Secondary Hypercoagulable State (ICD10:  D68.69) The patient is at significant risk for stroke/thromboembolism based upon his CHA2DS2-VASc Score of 4.  Continue Apixaban (Eliquis).   3. Obesity Body mass index is 40.84 kg/m. Lifestyle modification was discussed and encouraged including regular physical activity and weight reduction.  4. Obstructive sleep apnea Patient reports compliance with CPAP therapy.  5. HTN Elevated today, well controlled at previous visits and at home. No changes today.   6. CAD No anginal symptoms.  7. H/o NICM EF recovered to 55-60 on echo 2019. No signs or symptoms of fluid overload.   Follow up with Dr 2020 as scheduled.    Elberta Fortis PA-C [facility] [address] [place], [place] [phone] 02/04/2020 11:33 AM

## 2020-02-24 ENCOUNTER — Ambulatory Visit (INDEPENDENT_AMBULATORY_CARE_PROVIDER_SITE_OTHER): Payer: Medicare Other

## 2020-02-24 DIAGNOSIS — I442 Atrioventricular block, complete: Secondary | ICD-10-CM | POA: Diagnosis not present

## 2020-02-24 LAB — CUP PACEART REMOTE DEVICE CHECK
Battery Remaining Longevity: 47 mo
Battery Remaining Percentage: 71 %
Battery Voltage: 2.92 V
Brady Statistic AP VP Percent: 90 %
Brady Statistic AP VS Percent: 1 %
Brady Statistic AS VP Percent: 9.6 %
Brady Statistic AS VS Percent: 1 %
Brady Statistic RA Percent Paced: 81 %
Date Time Interrogation Session: 20211130074251
Implantable Lead Implant Date: 20170615
Implantable Lead Implant Date: 20170615
Implantable Lead Implant Date: 20170615
Implantable Lead Location: 753858
Implantable Lead Location: 753859
Implantable Lead Location: 753860
Implantable Pulse Generator Implant Date: 20170615
Lead Channel Impedance Value: 460 Ohm
Lead Channel Impedance Value: 480 Ohm
Lead Channel Impedance Value: 910 Ohm
Lead Channel Pacing Threshold Amplitude: 0.75 V
Lead Channel Pacing Threshold Amplitude: 0.75 V
Lead Channel Pacing Threshold Amplitude: 1.625 V
Lead Channel Pacing Threshold Pulse Width: 0.4 ms
Lead Channel Pacing Threshold Pulse Width: 0.4 ms
Lead Channel Pacing Threshold Pulse Width: 0.8 ms
Lead Channel Sensing Intrinsic Amplitude: 12 mV
Lead Channel Sensing Intrinsic Amplitude: 5 mV
Lead Channel Setting Pacing Amplitude: 1.75 V
Lead Channel Setting Pacing Amplitude: 2 V
Lead Channel Setting Pacing Amplitude: 2.625
Lead Channel Setting Pacing Pulse Width: 0.4 ms
Lead Channel Setting Pacing Pulse Width: 0.8 ms
Lead Channel Setting Sensing Sensitivity: 5 mV
Pulse Gen Model: 3262
Pulse Gen Serial Number: 7894586

## 2020-03-01 NOTE — Progress Notes (Signed)
Remote pacemaker transmission.   

## 2020-03-25 ENCOUNTER — Ambulatory Visit (INDEPENDENT_AMBULATORY_CARE_PROVIDER_SITE_OTHER): Payer: Medicare Other | Admitting: Cardiology

## 2020-03-25 ENCOUNTER — Encounter: Payer: Self-pay | Admitting: Cardiology

## 2020-03-25 ENCOUNTER — Other Ambulatory Visit: Payer: Self-pay | Admitting: Cardiology

## 2020-03-25 ENCOUNTER — Other Ambulatory Visit: Payer: Self-pay

## 2020-03-25 VITALS — BP 142/92 | HR 60 | Ht 71.0 in | Wt 291.0 lb

## 2020-03-25 DIAGNOSIS — I442 Atrioventricular block, complete: Secondary | ICD-10-CM

## 2020-03-25 LAB — CUP PACEART INCLINIC DEVICE CHECK
Battery Remaining Longevity: 40 mo
Battery Voltage: 2.92 V
Brady Statistic RA Percent Paced: 83 %
Brady Statistic RV Percent Paced: 97 %
Date Time Interrogation Session: 20211230170802
Implantable Lead Implant Date: 20170615
Implantable Lead Implant Date: 20170615
Implantable Lead Implant Date: 20170615
Implantable Lead Location: 753858
Implantable Lead Location: 753859
Implantable Lead Location: 753860
Implantable Pulse Generator Implant Date: 20170615
Lead Channel Impedance Value: 462.5 Ohm
Lead Channel Impedance Value: 475 Ohm
Lead Channel Impedance Value: 800 Ohm
Lead Channel Pacing Threshold Amplitude: 0.625 V
Lead Channel Pacing Threshold Amplitude: 0.875 V
Lead Channel Pacing Threshold Amplitude: 1.75 V
Lead Channel Pacing Threshold Pulse Width: 0.4 ms
Lead Channel Pacing Threshold Pulse Width: 0.4 ms
Lead Channel Pacing Threshold Pulse Width: 0.8 ms
Lead Channel Sensing Intrinsic Amplitude: 12 mV
Lead Channel Sensing Intrinsic Amplitude: 4.4 mV
Lead Channel Setting Pacing Amplitude: 1.625
Lead Channel Setting Pacing Amplitude: 2 V
Lead Channel Setting Pacing Amplitude: 2.75 V
Lead Channel Setting Pacing Pulse Width: 0.4 ms
Lead Channel Setting Pacing Pulse Width: 0.8 ms
Lead Channel Setting Sensing Sensitivity: 5 mV
Pulse Gen Model: 3262
Pulse Gen Serial Number: 7894586

## 2020-03-25 NOTE — Progress Notes (Signed)
Electrophysiology Office Note   Date:  03/25/2020   ID:  Harold Robertson, DOB January 31, 1951, MRN [id number]  PCP:  Harold Brooks, MD  Cardiologist:  Harold Robertson Primary Electrophysiologist:  Harold Robertson Harold Loa, MD    No chief complaint on file.    History of Present Illness: Harold Robertson is a 69 y.o. male who presents today for electrophysiology evaluation.     He has a history significant for hypertension, obesity, OSA on CPAP.  He was hospitalized with complete heart block.  He is status post Saint Jude CRT-P implanted 09/09/2015.  His ejection fraction at the time was 40 to 45%.  Left heart catheterization showed evidence for nonobstructive coronary artery disease.  He was diagnosed with atrial fibrillation after device alert 12/24/2019.  He was started on Eliquis at the time.  He had a cardioversion 01/28/2020.  Today, denies symptoms of palpitations, chest pain, shortness of breath, orthopnea, PND, lower extremity edema, claudication, dizziness, presyncope, syncope, bleeding, or neurologic sequela. The patient is tolerating medications without difficulties.  Since last being seen he has done well.  Unfortunately has been diagnosed with atrial fibrillation as above.  He is not noted any further episodes since his cardioversion.     Past Medical History:  Diagnosis Date  . Arthritis   . Complete heart block (HCC)   . Hypertension   . OSA on CPAP   . Presence of permanent cardiac pacemaker    Past Surgical History:  Procedure Laterality Date  . APPENDECTOMY  1960s  . CARDIAC CATHETERIZATION Right 09/08/2015   Procedure: Temporary Pacemaker;  Surgeon: Harold Robertson Harold Loa, MD;  Location: [facility] INVASIVE CV LAB;  Service: Cardiovascular;  Laterality: Right;  . CARDIAC CATHETERIZATION N/A 09/09/2015   Procedure: Left Heart Cath and Coronary Angiography;  Surgeon: Harold M Swaziland, MD;  Location: [facility] INVASIVE CV LAB;  Service: Cardiovascular;  Laterality: N/A;  . CARDIOVERSION N/A  01/28/2020   Procedure: CARDIOVERSION;  Surgeon: Harold Bathe, MD;  Location: [facility] ENDOSCOPY;  Service: Cardiovascular;  Laterality: N/A;  . CHEILECTOMY Left 03/15/2018   Procedure: CHEILECTOMY LEFT FOOT;  Surgeon: Harold Apley, MD;  Location: [facility];  Service: Orthopedics;  Laterality: Left;  . COLONOSCOPY    . EP IMPLANTABLE DEVICE N/A 09/09/2015   Procedure: BiV Pacemaker Insertion CRT-P;  Surgeon: Harold Robertson Harold Loa, MD;  Location: [facility] INVASIVE CV LAB;  Service: Cardiovascular;  Laterality: N/A;  . KNEE SURGERY Right   . TOTAL KNEE ARTHROPLASTY Right 07/11/2016  . TOTAL KNEE ARTHROPLASTY Right 07/11/2016   Procedure: TOTAL KNEE ARTHROPLASTY;  Surgeon: Harold Apley, MD;  Location: [facility] OR;  Service: Orthopedics;  Laterality: Right;     Current Outpatient Medications  Medication Sig Dispense Refill  . acetaminophen (TYLENOL) 650 MG CR tablet Take 650 mg by mouth every 8 (eight) hours as needed for pain.    Marland Kitchen apixaban (ELIQUIS) 5 MG TABS tablet Take 1 tablet (5 mg total) by mouth 2 (two) times daily. 60 tablet 3  . atorvastatin (LIPITOR) 40 MG tablet TAKE 1 TABLET BY MOUTH EVERY DAY AT 6PM 90 tablet 3  . carvedilol (COREG) 12.5 MG tablet TAKE 1 TABLET BY MOUTH TWICE A DAY 180 tablet 3  . cholecalciferol (VITAMIN D) 25 MCG (1000 UNIT) tablet Take 1,000 Units by mouth daily.    Marland Kitchen ibuprofen (ADVIL) 200 MG tablet Take 200 mg by mouth daily.    Marland Kitchen lisinopril (ZESTRIL) 40 MG tablet TAKE 1 TABLET BY MOUTH EVERY DAY  90 tablet 3   No current facility-administered medications for this visit.    Allergies:   Patient has no known allergies.   Social History:  The patient  reports that he has quit smoking. His smoking use included cigars. He quit after 6.00 years of use. His smokeless tobacco use includes snuff. He reports current alcohol use of about 1.0 - 2.0 standard drink of alcohol per week. He reports that he does not use drugs.   Family History:  The patient's family  history includes Cancer (age of onset: 71) in his mother; Heart disease in his maternal grandmother; Heart disease (age of onset: 29) in his brother; Heart disease (age of onset: 39) in his father; Liver cancer in his mother.   ROS:  Please see the history of present illness.   Otherwise, review of systems is positive for none.   All other systems are reviewed and negative.   PHYSICAL EXAM: VS:  BP (!) 142/92   Pulse 60   Ht 5\' 11"  (1.803 m)   Wt 291 lb (132 kg)   SpO2 96%   BMI 40.59 kg/m  , BMI Body mass index is 40.59 kg/m. GEN: Well nourished, well developed, in no acute distress  HEENT: normal  Neck: no JVD, carotid bruits, or masses Cardiac: RRR; no murmurs, rubs, or gallops,no edema  Respiratory:  clear to auscultation bilaterally, normal work of breathing GI: soft, nontender, nondistended, + BS MS: no deformity or atrophy  Skin: warm and dry, device site well healed Neuro:  Strength and sensation are intact Psych: euthymic mood, full affect  EKG:  EKG is ordered today. Personal review of the ekg ordered shows sinus rhythm, ventricular paced, rate 60  Personal review of the device interrogation today. Results in [facility]   Recent Labs: 05/19/2019: ALT 24 12/29/2019: TSH 0.949 01/19/2020: BUN 14; Creatinine, Ser 1.33; Hemoglobin 14.9; Platelets 172; Potassium 4.8; Sodium 140    Lipid Panel     Component Value Date/Time   CHOL 147 05/19/2019 0849   TRIG 135 05/19/2019 0849   HDL 38 (L) 05/19/2019 0849   CHOLHDL 3.9 05/19/2019 0849   VLDL 18 11/16/2016 0950   LDLCALC 86 05/19/2019 0849     Wt Readings from Last 3 Encounters:  03/25/20 291 lb (132 kg)  02/04/20 292 lb 12.8 oz (132.8 kg)  01/28/20 280 lb (127 kg)      Other studies Reviewed: Additional studies/ records that were reviewed today include: Cardiac cath 09/09/15  Review of the above records today demonstrates:   Prox RCA lesion, 20% stenosed.  Prox LAD to Mid LAD lesion, 40% stenosed.  Mid Cx  lesion, 20% stenosed.  Ost 2nd Mrg to 2nd Mrg lesion, 20% stenosed.  1st Diag lesion, 30% stenosed.  There is mild to moderate left ventricular systolic dysfunction.   1. Nonobstructive CAD 2. Mild to moderate LV dysfunction. EF 40-45%.  TTE 02/01/2018 - Left ventricle: Abnormal septal motion. The cavity size was  normal. Wall thickness was increased in a pattern of mild LVH.  Systolic function was normal. The estimated ejection fraction was  in the range of 55% to 60%. Wall motion was normal; there were no  regional wall motion abnormalities. Doppler parameters are  consistent with abnormal left ventricular relaxation (grade 1  diastolic dysfunction).  - Left atrium: The atrium was mildly dilated.  - Atrial septum: No defect or patent foramen ovale was identified.   ASSESSMENT AND PLAN:  1.  Complete heart block: Status post  Saint Jude CRT-P.  Device functioning appropriately.  No changes at this time.  2.  Nonobstructive coronary artery disease: No current chest pain.  Continue current management.  3.  Hypertension: Elevated today but usually well controlled.  No changes.  4.  Hyperlipidemia: Continue atorvastatin  5.  Paroxysmal atrial fibrillation/flutter: Status post cardioversion 01/28/2020.  Currently on Eliquis with a CHA2DS2-VASc of 4.  6.  Obstructive sleep apnea: CPAP compliance encouraged  7.  Preoperative evaluation: Patient is potentially planning left knee surgery.  He is currently minimally symptomatic without shortness of breath or chest pain.  Due to that, he would be at intermediate risk for this intermediate risk procedure.  He can hold his Eliquis for 3 days prior to his procedure.  Current medicines are reviewed at length with the patient today.   The patient does not have concerns regarding his medicines.  The following changes were made today: None  Labs/ tests ordered today include:  Orders Placed This Encounter  Procedures  . EKG 12-Lead      Disposition:   FU with Quincy Boy 1 year  Signed, Brandin Stetzer Harold Loa, MD  03/25/2020 3:31 PM     [facility] [address] [place] [address] [phone] (office) [phone] (fax)

## 2020-03-25 NOTE — Patient Instructions (Signed)
Medication Instructions:  Your physician recommends that you continue on your current medications as directed. Please refer to the Current Medication list given to you today.  *If you need a refill on your cardiac medications before your next appointment, please call your pharmacy*   Lab Work: None ordered   Testing/Procedures: None ordered   Follow-Up: At [facility], you and your health needs are our priority.  As part of our continuing mission to provide you with exceptional heart care, we have created designated Provider Care Teams.  These Care Teams include your primary Cardiologist (physician) and Advanced Practice Providers (APPs -  Physician Assistants and Nurse Practitioners) who all work together to provide you with the care you need, when you need it.  Remote monitoring is used to monitor your Pacemaker or ICD from home. This monitoring reduces the number of office visits required to check your device to one time per year. It allows [place] to keep an eye on the functioning of your device to ensure it is working properly. You are scheduled for a device check from home on 05/25/2020. You may send your transmission at any time that day. If you have a wireless device, the transmission will be sent automatically. After your physician reviews your transmission, you will receive a postcard with your next transmission date.  Your next appointment:   1 year(s)  The format for your next appointment:   In Person  Provider:   Loman Brooklyn, MD   Thank you for choosing [facility]!!   Dory Horn, RN [phone]

## 2020-05-25 ENCOUNTER — Ambulatory Visit (INDEPENDENT_AMBULATORY_CARE_PROVIDER_SITE_OTHER): Payer: Medicare Other

## 2020-05-25 DIAGNOSIS — I442 Atrioventricular block, complete: Secondary | ICD-10-CM

## 2020-05-25 LAB — CUP PACEART REMOTE DEVICE CHECK
Battery Remaining Longevity: 35 mo
Battery Remaining Percentage: 56 %
Battery Voltage: 2.89 V
Brady Statistic AP VP Percent: 93 %
Brady Statistic AP VS Percent: 1 %
Brady Statistic AS VP Percent: 6.5 %
Brady Statistic AS VS Percent: 1 %
Brady Statistic RA Percent Paced: 93 %
Date Time Interrogation Session: 20220301020015
Implantable Lead Implant Date: 20170615
Implantable Lead Implant Date: 20170615
Implantable Lead Implant Date: 20170615
Implantable Lead Location: 753858
Implantable Lead Location: 753859
Implantable Lead Location: 753860
Implantable Pulse Generator Implant Date: 20170615
Lead Channel Impedance Value: 400 Ohm
Lead Channel Impedance Value: 440 Ohm
Lead Channel Impedance Value: 760 Ohm
Lead Channel Pacing Threshold Amplitude: 0.5 V
Lead Channel Pacing Threshold Amplitude: 0.875 V
Lead Channel Pacing Threshold Amplitude: 1.625 V
Lead Channel Pacing Threshold Pulse Width: 0.4 ms
Lead Channel Pacing Threshold Pulse Width: 0.4 ms
Lead Channel Pacing Threshold Pulse Width: 0.8 ms
Lead Channel Sensing Intrinsic Amplitude: 12 mV
Lead Channel Sensing Intrinsic Amplitude: 4.4 mV
Lead Channel Setting Pacing Amplitude: 1.5 V
Lead Channel Setting Pacing Amplitude: 2 V
Lead Channel Setting Pacing Amplitude: 2.625
Lead Channel Setting Pacing Pulse Width: 0.4 ms
Lead Channel Setting Pacing Pulse Width: 0.8 ms
Lead Channel Setting Sensing Sensitivity: 5 mV
Pulse Gen Model: 3262
Pulse Gen Serial Number: 7894586

## 2020-06-02 NOTE — Progress Notes (Signed)
Remote pacemaker transmission.   

## 2020-06-26 ENCOUNTER — Other Ambulatory Visit: Payer: Self-pay | Admitting: Internal Medicine

## 2020-06-28 NOTE — Telephone Encounter (Signed)
Eliquis 5mg  refill request received. Patient is 70 years old, weight-132kg, Crea-1.33 on 01/19/2020, Diagnosis-Afib, and last seen by Dr. 01/21/2020 on 03/25/2020. Dose is appropriate based on dosing criteria. Will send in refill to requested pharmacy.

## 2020-07-03 ENCOUNTER — Other Ambulatory Visit: Payer: Self-pay | Admitting: Cardiology

## 2020-07-08 DIAGNOSIS — Z23 Encounter for immunization: Secondary | ICD-10-CM | POA: Diagnosis not present

## 2020-08-24 ENCOUNTER — Ambulatory Visit (INDEPENDENT_AMBULATORY_CARE_PROVIDER_SITE_OTHER): Payer: Medicare Other

## 2020-08-24 DIAGNOSIS — I442 Atrioventricular block, complete: Secondary | ICD-10-CM | POA: Diagnosis not present

## 2020-08-25 LAB — CUP PACEART REMOTE DEVICE CHECK
Battery Remaining Longevity: 36 mo
Battery Remaining Percentage: 56 %
Battery Voltage: 2.89 V
Brady Statistic AP VP Percent: 93 %
Brady Statistic AP VS Percent: 1 %
Brady Statistic AS VP Percent: 6.4 %
Brady Statistic AS VS Percent: 1 %
Brady Statistic RA Percent Paced: 93 %
Date Time Interrogation Session: 20220531020033
Implantable Lead Implant Date: 20170615
Implantable Lead Implant Date: 20170615
Implantable Lead Implant Date: 20170615
Implantable Lead Location: 753858
Implantable Lead Location: 753859
Implantable Lead Location: 753860
Implantable Pulse Generator Implant Date: 20170615
Lead Channel Impedance Value: 440 Ohm
Lead Channel Impedance Value: 440 Ohm
Lead Channel Impedance Value: 830 Ohm
Lead Channel Pacing Threshold Amplitude: 0.625 V
Lead Channel Pacing Threshold Amplitude: 0.875 V
Lead Channel Pacing Threshold Amplitude: 1.625 V
Lead Channel Pacing Threshold Pulse Width: 0.4 ms
Lead Channel Pacing Threshold Pulse Width: 0.4 ms
Lead Channel Pacing Threshold Pulse Width: 0.8 ms
Lead Channel Sensing Intrinsic Amplitude: 12 mV
Lead Channel Sensing Intrinsic Amplitude: 4.2 mV
Lead Channel Setting Pacing Amplitude: 1.625
Lead Channel Setting Pacing Amplitude: 2 V
Lead Channel Setting Pacing Amplitude: 2.625
Lead Channel Setting Pacing Pulse Width: 0.4 ms
Lead Channel Setting Pacing Pulse Width: 0.8 ms
Lead Channel Setting Sensing Sensitivity: 5 mV
Pulse Gen Model: 3262
Pulse Gen Serial Number: 7894586

## 2020-09-15 NOTE — Progress Notes (Signed)
Remote pacemaker transmission.   

## 2020-11-23 ENCOUNTER — Ambulatory Visit (INDEPENDENT_AMBULATORY_CARE_PROVIDER_SITE_OTHER): Payer: Medicare Other

## 2020-11-23 DIAGNOSIS — I442 Atrioventricular block, complete: Secondary | ICD-10-CM

## 2020-11-23 LAB — CUP PACEART REMOTE DEVICE CHECK
Battery Remaining Longevity: 9 mo
Battery Remaining Percentage: 15 %
Battery Voltage: 2.86 V
Brady Statistic AP VP Percent: 92 %
Brady Statistic AP VS Percent: 1 %
Brady Statistic AS VP Percent: 8 %
Brady Statistic AS VS Percent: 1 %
Brady Statistic RA Percent Paced: 92 %
Date Time Interrogation Session: 20220830020012
Implantable Lead Implant Date: 20170615
Implantable Lead Implant Date: 20170615
Implantable Lead Implant Date: 20170615
Implantable Lead Location: 753858
Implantable Lead Location: 753859
Implantable Lead Location: 753860
Implantable Pulse Generator Implant Date: 20170615
Lead Channel Impedance Value: 440 Ohm
Lead Channel Impedance Value: 460 Ohm
Lead Channel Impedance Value: 800 Ohm
Lead Channel Pacing Threshold Amplitude: 0.625 V
Lead Channel Pacing Threshold Amplitude: 0.875 V
Lead Channel Pacing Threshold Amplitude: 2 V
Lead Channel Pacing Threshold Pulse Width: 0.4 ms
Lead Channel Pacing Threshold Pulse Width: 0.4 ms
Lead Channel Pacing Threshold Pulse Width: 0.8 ms
Lead Channel Sensing Intrinsic Amplitude: 12 mV
Lead Channel Sensing Intrinsic Amplitude: 5 mV
Lead Channel Setting Pacing Amplitude: 1.625
Lead Channel Setting Pacing Amplitude: 2 V
Lead Channel Setting Pacing Amplitude: 3 V
Lead Channel Setting Pacing Pulse Width: 0.4 ms
Lead Channel Setting Pacing Pulse Width: 0.8 ms
Lead Channel Setting Sensing Sensitivity: 5 mV
Pulse Gen Model: 3262
Pulse Gen Serial Number: 7894586

## 2020-12-06 NOTE — Progress Notes (Signed)
Remote pacemaker transmission.   

## 2020-12-28 ENCOUNTER — Emergency Department (HOSPITAL_COMMUNITY): Payer: Medicare Other

## 2020-12-28 ENCOUNTER — Telehealth: Payer: Self-pay | Admitting: Cardiology

## 2020-12-28 ENCOUNTER — Emergency Department (HOSPITAL_COMMUNITY)
Admission: EM | Admit: 2020-12-28 | Discharge: 2020-12-29 | Disposition: A | Discharge status: Home / Self Care | Payer: Medicare Other | Attending: Emergency Medicine | Admitting: Emergency Medicine

## 2020-12-28 ENCOUNTER — Other Ambulatory Visit: Payer: Self-pay

## 2020-12-28 ENCOUNTER — Encounter (HOSPITAL_COMMUNITY): Payer: Self-pay | Admitting: Emergency Medicine

## 2020-12-28 DIAGNOSIS — R0789 Other chest pain: Secondary | ICD-10-CM | POA: Diagnosis not present

## 2020-12-28 DIAGNOSIS — H9311 Tinnitus, right ear: Secondary | ICD-10-CM | POA: Insufficient documentation

## 2020-12-28 DIAGNOSIS — I10 Essential (primary) hypertension: Secondary | ICD-10-CM | POA: Insufficient documentation

## 2020-12-28 DIAGNOSIS — Z79899 Other long term (current) drug therapy: Secondary | ICD-10-CM | POA: Insufficient documentation

## 2020-12-28 DIAGNOSIS — I4892 Unspecified atrial flutter: Secondary | ICD-10-CM | POA: Diagnosis not present

## 2020-12-28 DIAGNOSIS — Z96651 Presence of right artificial knee joint: Secondary | ICD-10-CM | POA: Diagnosis not present

## 2020-12-28 DIAGNOSIS — R079 Chest pain, unspecified: Secondary | ICD-10-CM

## 2020-12-28 DIAGNOSIS — Z7901 Long term (current) use of anticoagulants: Secondary | ICD-10-CM | POA: Insufficient documentation

## 2020-12-28 DIAGNOSIS — Z87891 Personal history of nicotine dependence: Secondary | ICD-10-CM | POA: Diagnosis not present

## 2020-12-28 LAB — COMPREHENSIVE METABOLIC PANEL
ALT: 23 U/L (ref 0–44)
AST: 20 U/L (ref 15–41)
Albumin: 3.6 g/dL (ref 3.5–5.0)
Alkaline Phosphatase: 59 U/L (ref 38–126)
Anion gap: 8 (ref 5–15)
BUN: 7 mg/dL — ABNORMAL LOW (ref 8–23)
CO2: 26 mmol/L (ref 22–32)
Calcium: 9.1 mg/dL (ref 8.9–10.3)
Chloride: 106 mmol/L (ref 98–111)
Creatinine, Ser: 1.1 mg/dL (ref 0.61–1.24)
GFR, Estimated: >60 mL/min (ref >60)
Glucose, Bld: 120 mg/dL — ABNORMAL HIGH (ref 70–99)
Potassium: 4 mmol/L (ref 3.5–5.1)
Sodium: 140 mmol/L (ref 135–145)
Total Bilirubin: 1.4 mg/dL — ABNORMAL HIGH (ref 0.3–1.2)
Total Protein: 6.4 g/dL — ABNORMAL LOW (ref 6.5–8.1)

## 2020-12-28 LAB — CBC WITH DIFFERENTIAL/PLATELET
Abs Immature Granulocytes: 0.01 10*3/uL (ref 0.00–0.07)
Basophils Absolute: 0 10*3/uL (ref 0.0–0.1)
Basophils Relative: 0 %
Eosinophils Absolute: 0.2 10*3/uL (ref 0.0–0.5)
Eosinophils Relative: 3 %
HCT: 45.2 % (ref 39.0–52.0)
Hemoglobin: 15.1 g/dL (ref 13.0–17.0)
Immature Granulocytes: 0 %
Lymphocytes Relative: 22 %
Lymphs Abs: 1.2 10*3/uL (ref 0.7–4.0)
MCH: 29.5 pg (ref 26.0–34.0)
MCHC: 33.4 g/dL (ref 30.0–36.0)
MCV: 88.5 fL (ref 80.0–100.0)
Monocytes Absolute: 0.4 10*3/uL (ref 0.1–1.0)
Monocytes Relative: 8 %
Neutro Abs: 3.7 10*3/uL (ref 1.7–7.7)
Neutrophils Relative %: 67 %
Platelets: 172 10*3/uL (ref 150–400)
RBC: 5.11 MIL/uL (ref 4.22–5.81)
RDW: 12.6 % (ref 11.5–15.5)
WBC: 5.5 10*3/uL (ref 4.0–10.5)
nRBC: 0 % (ref 0.0–0.2)

## 2020-12-28 LAB — TROPONIN I (HIGH SENSITIVITY)
Troponin I (High Sensitivity): 5 ng/L (ref <18)
Troponin I (High Sensitivity): 5 ng/L (ref <18)

## 2020-12-28 MED ORDER — ASPIRIN 81 MG PO CHEW: 324.0000 mg | CHEWABLE_TABLET | Freq: Once | ORAL | Status: DC

## 2020-12-28 NOTE — ED Triage Notes (Signed)
Patient complains of an intermittent left sided chest pain that has increased in intensity over the last two weeks, also reports a "roaring sound" in his right ear. Describes pain as feeling like a pulled muscle with sharp pain, pain is exacerbated by exertion and movement. Denies nausea and vomiting.

## 2020-12-28 NOTE — Telephone Encounter (Signed)
Spoke with pt in regards to CP.  He reports pain started a couple of weeks ago and has progressively gotten worse.  CP is on the left side of chest and occurs with activity.  As long as he is sitting pain is not very noticeable.  He reports that he is currently sitting down but if he gets up CP is pretty intense.  I advised him to go to the ED for evaluation.  Pt is agreeable to this plan.

## 2020-12-28 NOTE — Telephone Encounter (Signed)
Pt c/o of Chest Pain: STAT if CP now or developed within 24 hours  1. Are you having CP right now? Yes  2. Are you experiencing any other symptoms (ex. SOB, nausea, vomiting, sweating)? SOB upon exertion   3. How long have you been experiencing CP? Week and a half, gotten more intense in last few days   4. Is your CP continuous or coming and going? Continuous   5. Have you taken Nitroglycerin? No     ?

## 2020-12-28 NOTE — ED Provider Notes (Signed)
Emergency Medicine Provider Triage Evaluation Note  Harold Robertson , a 70 y.o. male  was evaluated in triage.  Pt complains of chest pain and ringing in the right ear.  Symptoms have been present only over the last week and a half.  Symptoms have become worse over the last few days.  Patient reports that chest pain is exertional.  Chest pain is described as a "muscle strain."  Has associated shortness of breath.  Patient has pacemaker.  Is currently on Eliquis.  Review of Systems  Positive: Chest pain, shortness of breath, ringing anemia Negative: Abdominal pain, nausea, vomiting, diaphoresis  Physical Exam  BP 134/78 (BP Location: Left Arm)   Pulse (!) 58   Temp 98.5 F (36.9 C) (Oral)   Resp 18   SpO2 96%  Gen:   Awake, no distress   Resp:  Normal effort, lungs clear to auscultation bilaterally MSK:   Moves extremities without difficulty  Other:  +2 radial pulse bilaterally  Medical Decision Making  Medically screening exam initiated at 9:10 AM.  Appropriate orders placed.  [name] was informed that the remainder of the evaluation will be completed by another provider, this initial triage assessment does not replace that evaluation, and the importance of remaining in the ED until their evaluation is complete.  The patient appears stable so that the remainder of the work up may be completed by another provider.      [name], PA-C 12/28/20 2778    [name], DO 12/28/20 1116

## 2020-12-28 NOTE — ED Notes (Signed)
Patient stated his pain level is now a 7, triage RN aware.

## 2020-12-29 MED ORDER — CYCLOBENZAPRINE HCL 5 MG PO TABS: 5.0000 mg | ORAL_TABLET | Freq: Three times a day (TID) | ORAL | 0 refills | Status: DC | PRN

## 2020-12-29 NOTE — ED Notes (Signed)
Patient verbalizes understanding of discharge instructions. Opportunity for questioning and answers were provided. Armband removed by staff, pt discharged from ED ambulatory.   

## 2020-12-29 NOTE — ED Provider Notes (Signed)
[facility] EMERGENCY DEPARTMENT Provider Note   CSN: [id number] Arrival date & time: 12/28/20  3614     History Chief Complaint  Patient presents with   Chest Pain    Harold Robertson is a 70 y.o. male hx of HTN, afib on eliquis, heart block with pacemaker here presenting with chest pain.  Patient has intermittent left-sided chest pain is worse with movement and deep breaths for the last 2 weeks.  Patient denies any trauma or injury.  Patient states that he is going to [facility] and want to make sure everything is okay.  Patient also has some ringing in his ears especially on the right side.  Denies any dizziness or falls.  Patient denies any CAD or stents.  The history is provided by the patient.      Past Medical History:  Diagnosis Date   Arthritis    Complete heart block (HCC)    Hypertension    OSA on CPAP    Presence of permanent cardiac pacemaker     Patient Active Problem List   Diagnosis Date Noted   PAF (paroxysmal atrial fibrillation) (HCC)    Atrial flutter (HCC) 12/29/2019   Secondary hypercoagulable state (HCC) 12/29/2019   Hyperlipidemia 11/16/2016   Primary osteoarthritis of right knee 06/12/2016   Vitamin D deficiency 05/12/2016   Presence of permanent cardiac pacemaker 05/10/2016   Left ventricular dysfunction 05/10/2016   Sepsis associated hypotension (HCC) 01/26/2016   UTI (urinary tract infection) 01/26/2016   Sepsis (HCC) 01/26/2016   Complete heart block (HCC) 09/08/2015   Hypertension 07/17/2011   Left bundle branch block 07/17/2011   Obstructive sleep apnea 10/25/2009    Past Surgical History:  Procedure Laterality Date   APPENDECTOMY  1960s   CARDIAC CATHETERIZATION Right 09/08/2015   Procedure: Temporary Pacemaker;  Surgeon: Will Jorja Loa, MD;  Location: [facility] INVASIVE CV LAB;  Service: Cardiovascular;  Laterality: Right;   CARDIAC CATHETERIZATION N/A 09/09/2015   Procedure: Left Heart Cath and Coronary  Angiography;  Surgeon: Peter M Swaziland, MD;  Location: [facility] INVASIVE CV LAB;  Service: Cardiovascular;  Laterality: N/A;   CARDIOVERSION N/A 01/28/2020   Procedure: CARDIOVERSION;  Surgeon: Jake Bathe, MD;  Location: [facility] ENDOSCOPY;  Service: Cardiovascular;  Laterality: N/A;   CHEILECTOMY Left 03/15/2018   Procedure: CHEILECTOMY LEFT FOOT;  Surgeon: Sheral Apley, MD;  Location:  SURGERY CENTER;  Service: Orthopedics;  Laterality: Left;   COLONOSCOPY     EP IMPLANTABLE DEVICE N/A 09/09/2015   Procedure: BiV Pacemaker Insertion CRT-P;  Surgeon: Will Jorja Loa, MD;  Location: [facility] INVASIVE CV LAB;  Service: Cardiovascular;  Laterality: N/A;   KNEE SURGERY Right    TOTAL KNEE ARTHROPLASTY Right 07/11/2016   TOTAL KNEE ARTHROPLASTY Right 07/11/2016   Procedure: TOTAL KNEE ARTHROPLASTY;  Surgeon: Sheral Apley, MD;  Location: [facility] OR;  Service: Orthopedics;  Laterality: Right;       Family History  Problem Relation Age of Onset   Heart disease Father 66       AMI   Liver cancer Mother    Cancer Mother 31       Colon cancer with liver mets   Heart disease Brother 40       AMI   Heart disease Maternal Grandmother     Social History   Tobacco Use   Smoking status: Former    Types: Cigars   Smokeless tobacco: Current    Types: Snuff  Substance Use Topics  Alcohol use: Yes    Alcohol/week: 1.0 - 2.0 standard drink    Types: 1 - 2 Cans of beer per week    Comment: occ   Drug use: No    Home Medications Prior to Admission medications   Medication Sig Start Date End Date Taking? Authorizing Provider  acetaminophen (TYLENOL) 650 MG CR tablet Take 650 mg by mouth every 8 (eight) hours as needed for pain.    [provider]  atorvastatin (LIPITOR) 40 MG tablet TAKE 1 TABLET BY MOUTH EVERY DAY AT [facility] 07/06/20   Camnitz, Andree Coss, MD  carvedilol (COREG) 12.5 MG tablet TAKE 1 TABLET BY MOUTH TWICE A DAY 03/25/20   Camnitz, Andree Coss, MD  cholecalciferol  (VITAMIN D) 25 MCG (1000 UNIT) tablet Take 1,000 Units by mouth daily.    [provider]  ELIQUIS 5 MG TABS tablet TAKE 1 TABLET BY MOUTH TWICE A DAY 06/28/20   Camnitz, Andree Coss, MD  ibuprofen (ADVIL) 200 MG tablet Take 200 mg by mouth daily.    [provider]  lisinopril (ZESTRIL) 40 MG tablet TAKE 1 TABLET BY MOUTH EVERY DAY 07/06/20   Camnitz, Andree Coss, MD    Allergies    Patient has no known allergies.  Review of Systems   Review of Systems  Cardiovascular:  Positive for chest pain.  All other systems reviewed and are negative.  Physical Exam Updated Vital Signs BP (!) 140/96   Pulse 65   Temp 98.5 F (36.9 C) (Oral)   Resp 16   SpO2 100%   Physical Exam Vitals and nursing note reviewed.  HENT:     Head: Normocephalic.     Comments: Bilateral TMs normal.  No obvious effusion Eyes:     Extraocular Movements: Extraocular movements intact.     Pupils: Pupils are equal, round, and reactive to light.  Cardiovascular:     Rate and Rhythm: Normal rate and regular rhythm.     Heart sounds: Normal heart sounds.  Pulmonary:     Effort: Pulmonary effort is normal.     Breath sounds: Normal breath sounds.     Comments: Reproducible left chest wall tenderness Abdominal:     General: Bowel sounds are normal.     Palpations: Abdomen is soft.  Musculoskeletal:        General: Normal range of motion.     Cervical back: Normal range of motion and neck supple.  Skin:    General: Skin is warm.  Neurological:     General: No focal deficit present.     Mental Status: He is alert and oriented to person, place, and time.     Cranial Nerves: No cranial nerve deficit.     Comments: Cranial nerves II to XII intact, normal strength bilaterally.  Normal gait.  Psychiatric:        Mood and Affect: Mood normal.        Behavior: Behavior normal.    ED Results / Procedures / Treatments   Labs (all labs ordered are listed, but only abnormal results are  displayed) Labs Reviewed  COMPREHENSIVE METABOLIC PANEL - Abnormal; Notable for the following components:      Result Value   Glucose, Bld 120 (*)    BUN 7 (*)    Total Protein 6.4 (*)    Total Bilirubin 1.4 (*)    All other components within normal limits  CBC WITH DIFFERENTIAL/PLATELET  TROPONIN I (HIGH SENSITIVITY)  TROPONIN I (HIGH SENSITIVITY)  EKG EKG Interpretation  Date/Time:  Tuesday December 28 2020 09:00:58 EDT Ventricular Rate:  60 PR Interval:    QRS Duration: 138 QT Interval:  456 QTC Calculation: 456 R Axis:   129 Text Interpretation: AV dual-paced rhythm with occasional ventricular-paced complexes Biventricular pacemaker detected Abnormal ECG Confirmed by Margarita Grizzle [phone]) on 12/28/2020 3:17:50 PM  Radiology DG Chest 1 View  Result Date: 12/28/2020 CLINICAL DATA:  Chest pain for 10 days. EXAM: CHEST  1 VIEW COMPARISON:  01/26/2016. FINDINGS: Cardiac silhouette is normal in size. No mediastinal or hilar masses. Left anterior chest wall biventricular cardioverter-defibrillator is stable. Clear lungs.  No convincing pleural effusion or pneumothorax. Old healed rib fractures on the left. No acute skeletal abnormality. IMPRESSION: No active disease. Electronically Signed   By: Amie Portland M.D.   On: 12/28/2020 09:57    Procedures Procedures   Medications Ordered in ED Medications  aspirin chewable tablet 324 mg (has no administration in time range)    ED Course  I have reviewed the triage vital signs and the nursing notes.  Pertinent labs & imaging results that were available during my care of the patient were reviewed by me and considered in my medical decision making (see chart for details).    MDM Rules/Calculators/A&P                           Harold Robertson is a 70 y.o. male here presenting with chest pain and ringing in the right ear.  Chest pain is reproducible on palpation.  Patient had 2 negative troponins.  Patient is not tachycardic and  is already on Eliquis and I doubt PE.  Patient has ringing in his right ear has normal neuro exam.  Will not need any brain imaging currently.  We will have him follow-up with cardiology for stress test if he has persistent pain. Will give muscle relaxants as I think he is likely having MSK pain    Final Clinical Impression(s) / ED Diagnoses Final diagnoses:  Chest pain    Rx / DC Orders ED Discharge Orders     None        Charlynne Pander, MD 12/29/20 0045

## 2020-12-29 NOTE — Discharge Instructions (Addendum)
Your heart enzyme tests are normal right now.  I think you likely have muscle strain so you can take Flexeril as needed  If you have persistent pain you need to call cardiology to arrange for a stress test  Return to ER if you have worse chest pain, shortness of breath, dizziness, trouble speaking

## 2020-12-31 ENCOUNTER — Other Ambulatory Visit: Payer: Self-pay | Admitting: Cardiology

## 2020-12-31 NOTE — Telephone Encounter (Signed)
Pt last saw Dr Elberta Fortis 03/25/20, last labs 12/28/20 Creat 1.10, age 70, weight 132kg, based on specified criteria pt is on appropriate dosage of Eliquis 5mg  BID for afib.  Will refill rx.

## 2021-01-14 ENCOUNTER — Other Ambulatory Visit: Payer: Self-pay | Admitting: Cardiology

## 2021-02-22 ENCOUNTER — Ambulatory Visit (INDEPENDENT_AMBULATORY_CARE_PROVIDER_SITE_OTHER): Payer: Medicare Other

## 2021-02-22 DIAGNOSIS — I442 Atrioventricular block, complete: Secondary | ICD-10-CM | POA: Diagnosis not present

## 2021-02-22 LAB — CUP PACEART REMOTE DEVICE CHECK
Battery Remaining Longevity: 7 mo
Battery Remaining Percentage: 10 %
Battery Voltage: 2.83 V
Brady Statistic AP VP Percent: 91 %
Brady Statistic AP VS Percent: 1 %
Brady Statistic AS VP Percent: 9.1 %
Brady Statistic AS VS Percent: 1 %
Brady Statistic RA Percent Paced: 91 %
Date Time Interrogation Session: 20221129023641
Implantable Lead Implant Date: 20170615
Implantable Lead Implant Date: 20170615
Implantable Lead Implant Date: 20170615
Implantable Lead Location: 753858
Implantable Lead Location: 753859
Implantable Lead Location: 753860
Implantable Pulse Generator Implant Date: 20170615
Lead Channel Impedance Value: 440 Ohm
Lead Channel Impedance Value: 490 Ohm
Lead Channel Impedance Value: 810 Ohm
Lead Channel Pacing Threshold Amplitude: 0.5 V
Lead Channel Pacing Threshold Amplitude: 0.875 V
Lead Channel Pacing Threshold Amplitude: 2.125 V
Lead Channel Pacing Threshold Pulse Width: 0.4 ms
Lead Channel Pacing Threshold Pulse Width: 0.4 ms
Lead Channel Pacing Threshold Pulse Width: 0.8 ms
Lead Channel Sensing Intrinsic Amplitude: 12 mV
Lead Channel Sensing Intrinsic Amplitude: 4.9 mV
Lead Channel Setting Pacing Amplitude: 1.5 V
Lead Channel Setting Pacing Amplitude: 2 V
Lead Channel Setting Pacing Amplitude: 3.125
Lead Channel Setting Pacing Pulse Width: 0.4 ms
Lead Channel Setting Pacing Pulse Width: 0.8 ms
Lead Channel Setting Sensing Sensitivity: 5 mV
Pulse Gen Model: 3262
Pulse Gen Serial Number: 7894586

## 2021-03-01 ENCOUNTER — Emergency Department (HOSPITAL_BASED_OUTPATIENT_CLINIC_OR_DEPARTMENT_OTHER)
Admission: EM | Admit: 2021-03-01 | Discharge: 2021-03-01 | Disposition: A | Discharge status: Home / Self Care | Payer: Medicare Other | Attending: Emergency Medicine | Admitting: Emergency Medicine

## 2021-03-01 ENCOUNTER — Other Ambulatory Visit: Payer: Self-pay

## 2021-03-01 ENCOUNTER — Emergency Department (HOSPITAL_BASED_OUTPATIENT_CLINIC_OR_DEPARTMENT_OTHER): Payer: Medicare Other | Admitting: Radiology

## 2021-03-01 ENCOUNTER — Encounter (HOSPITAL_BASED_OUTPATIENT_CLINIC_OR_DEPARTMENT_OTHER): Payer: Self-pay | Admitting: *Deleted

## 2021-03-01 DIAGNOSIS — J101 Influenza due to other identified influenza virus with other respiratory manifestations: Secondary | ICD-10-CM

## 2021-03-01 DIAGNOSIS — Z79899 Other long term (current) drug therapy: Secondary | ICD-10-CM | POA: Insufficient documentation

## 2021-03-01 DIAGNOSIS — I10 Essential (primary) hypertension: Secondary | ICD-10-CM | POA: Insufficient documentation

## 2021-03-01 DIAGNOSIS — Z96651 Presence of right artificial knee joint: Secondary | ICD-10-CM | POA: Insufficient documentation

## 2021-03-01 DIAGNOSIS — J09X2 Influenza due to identified novel influenza A virus with other respiratory manifestations: Secondary | ICD-10-CM | POA: Insufficient documentation

## 2021-03-01 DIAGNOSIS — G4733 Obstructive sleep apnea (adult) (pediatric): Secondary | ICD-10-CM | POA: Diagnosis not present

## 2021-03-01 DIAGNOSIS — Z20822 Contact with and (suspected) exposure to covid-19: Secondary | ICD-10-CM | POA: Insufficient documentation

## 2021-03-01 DIAGNOSIS — Z7901 Long term (current) use of anticoagulants: Secondary | ICD-10-CM | POA: Diagnosis not present

## 2021-03-01 DIAGNOSIS — Z950 Presence of cardiac pacemaker: Secondary | ICD-10-CM | POA: Insufficient documentation

## 2021-03-01 DIAGNOSIS — Z87891 Personal history of nicotine dependence: Secondary | ICD-10-CM | POA: Diagnosis not present

## 2021-03-01 DIAGNOSIS — I517 Cardiomegaly: Secondary | ICD-10-CM | POA: Diagnosis not present

## 2021-03-01 DIAGNOSIS — J40 Bronchitis, not specified as acute or chronic: Secondary | ICD-10-CM | POA: Diagnosis not present

## 2021-03-01 DIAGNOSIS — R5383 Other fatigue: Secondary | ICD-10-CM | POA: Diagnosis not present

## 2021-03-01 DIAGNOSIS — R509 Fever, unspecified: Secondary | ICD-10-CM | POA: Diagnosis present

## 2021-03-01 LAB — COMPREHENSIVE METABOLIC PANEL
ALT: 20 U/L (ref 0–44)
AST: 24 U/L (ref 15–41)
Albumin: 3.9 g/dL (ref 3.5–5.0)
Alkaline Phosphatase: 56 U/L (ref 38–126)
Anion gap: 9 (ref 5–15)
BUN: 15 mg/dL (ref 8–23)
CO2: 24 mmol/L (ref 22–32)
Calcium: 8.9 mg/dL (ref 8.9–10.3)
Chloride: 105 mmol/L (ref 98–111)
Creatinine, Ser: 0.93 mg/dL (ref 0.61–1.24)
GFR, Estimated: >60 mL/min (ref >60)
Glucose, Bld: 121 mg/dL — ABNORMAL HIGH (ref 70–99)
Potassium: 4.1 mmol/L (ref 3.5–5.1)
Sodium: 138 mmol/L (ref 135–145)
Total Bilirubin: 1.7 mg/dL — ABNORMAL HIGH (ref 0.3–1.2)
Total Protein: 6.8 g/dL (ref 6.5–8.1)

## 2021-03-01 LAB — I-STAT ARTERIAL BLOOD GAS, ED
Acid-Base Excess: 0 mmol/L (ref 0.0–2.0)
Bicarbonate: 24.5 mmol/L (ref 20.0–28.0)
Calcium, Ion: 1.2 mmol/L (ref 1.15–1.40)
HCT: 39 % (ref 39.0–52.0)
Hemoglobin: 13.3 g/dL (ref 13.0–17.0)
O2 Saturation: 94 %
Patient temperature: 98.5
Potassium: 4 mmol/L (ref 3.5–5.1)
Sodium: 138 mmol/L (ref 135–145)
TCO2: 26 mmol/L (ref 22–32)
pCO2 arterial: 36.6 mmHg (ref 32.0–48.0)
pH, Arterial: 7.434 (ref 7.350–7.450)
pO2, Arterial: 70 mmHg — ABNORMAL LOW (ref 83.0–108.0)

## 2021-03-01 LAB — RESP PANEL BY RT-PCR (FLU A&B, COVID) ARPGX2
Influenza A by PCR: POSITIVE — AB
Influenza B by PCR: NEGATIVE
SARS Coronavirus 2 by RT PCR: NEGATIVE

## 2021-03-01 LAB — CBC
HCT: 40.7 % (ref 39.0–52.0)
Hemoglobin: 14.2 g/dL (ref 13.0–17.0)
MCH: 30 pg (ref 26.0–34.0)
MCHC: 34.9 g/dL (ref 30.0–36.0)
MCV: 85.9 fL (ref 80.0–100.0)
Platelets: 183 10*3/uL (ref 150–400)
RBC: 4.74 MIL/uL (ref 4.22–5.81)
RDW: 11.9 % (ref 11.5–15.5)
WBC: 5.8 10*3/uL (ref 4.0–10.5)
nRBC: 0 % (ref 0.0–0.2)

## 2021-03-01 LAB — PROTIME-INR
INR: 1.2 (ref 0.8–1.2)
Prothrombin Time: 15.1 seconds (ref 11.4–15.2)

## 2021-03-01 LAB — LACTIC ACID, PLASMA: Lactic Acid, Venous: 1.2 mmol/L (ref 0.5–1.9)

## 2021-03-01 MED ORDER — PREDNISONE 10 MG (21) PO TBPK: ORAL_TABLET | ORAL | 0 refills | Status: DC

## 2021-03-01 MED ORDER — IPRATROPIUM-ALBUTEROL 0.5-2.5 (3) MG/3ML IN SOLN
3.0000 mL | Freq: Once | RESPIRATORY_TRACT | Status: AC
  Administered 2021-03-01: 3 mL via RESPIRATORY_TRACT
  Filled 2021-03-01: qty 3

## 2021-03-01 MED ORDER — ACETAMINOPHEN 325 MG PO TABS
650.0000 mg | ORAL_TABLET | Freq: Once | ORAL | Status: DC
  Filled 2021-03-01: qty 2

## 2021-03-01 MED ORDER — ALBUTEROL SULFATE HFA 108 (90 BASE) MCG/ACT IN AERS
2.0000 | INHALATION_SPRAY | RESPIRATORY_TRACT | Status: DC | PRN
  Administered 2021-03-01: 2 via RESPIRATORY_TRACT
  Filled 2021-03-01: qty 6.7

## 2021-03-01 MED ORDER — METHYLPREDNISOLONE SODIUM SUCC 125 MG IJ SOLR
125.0000 mg | Freq: Once | INTRAMUSCULAR | Status: AC
  Administered 2021-03-01: 125 mg via INTRAVENOUS
  Filled 2021-03-01: qty 2

## 2021-03-01 NOTE — ED Triage Notes (Signed)
Last Wednesday started with flu like symptoms, today more lethargic, fever, cough and chills. Over last week diarrhea and abd pain on and off.

## 2021-03-01 NOTE — ED Notes (Signed)
RT at bedside obtaining ABG. MD aware of pt's increased WOB.

## 2021-03-01 NOTE — ED Provider Notes (Signed)
[facility] EMERGENCY DEPARTMENT Provider Note  CSN: [id number] Arrival date & time: 03/01/21 1541    History Chief Complaint  Patient presents with   Fever    Harold Robertson is a 70 y.o. male with history of complete heart block s/p pacemaker, OSA on CPAP at night has been having some viral illness symptoms for a few days but was doing well this morning when his daughter dropped her kids off for him to watch them. When she came home from work, he was having much more trouble breathing, seemed to be more confused/difficult to wake up. He was noted to be febrile then too. He denies any chest pain. No prior history of breathing problems, does not use inhaler or nebs at home.    Past Medical History:  Diagnosis Date   Arthritis    Complete heart block (HCC)    Hypertension    OSA on CPAP    Presence of permanent cardiac pacemaker     Past Surgical History:  Procedure Laterality Date   APPENDECTOMY  1960s   CARDIAC CATHETERIZATION Right 09/08/2015   Procedure: Temporary Pacemaker;  Surgeon: Will Jorja Loa, MD;  Location: [facility] INVASIVE CV LAB;  Service: Cardiovascular;  Laterality: Right;   CARDIAC CATHETERIZATION N/A 09/09/2015   Procedure: Left Heart Cath and Coronary Angiography;  Surgeon: Peter M Swaziland, MD;  Location: [facility] INVASIVE CV LAB;  Service: Cardiovascular;  Laterality: N/A;   CARDIOVERSION N/A 01/28/2020   Procedure: CARDIOVERSION;  Surgeon: Jake Bathe, MD;  Location: [facility] ENDOSCOPY;  Service: Cardiovascular;  Laterality: N/A;   CHEILECTOMY Left 03/15/2018   Procedure: CHEILECTOMY LEFT FOOT;  Surgeon: Sheral Apley, MD;  Location: [facility];  Service: Orthopedics;  Laterality: Left;   COLONOSCOPY     EP IMPLANTABLE DEVICE N/A 09/09/2015   Procedure: BiV Pacemaker Insertion CRT-P;  Surgeon: Will Jorja Loa, MD;  Location: [facility] INVASIVE CV LAB;  Service: Cardiovascular;  Laterality: N/A;   KNEE SURGERY Right    TOTAL KNEE ARTHROPLASTY  Right 07/11/2016   TOTAL KNEE ARTHROPLASTY Right 07/11/2016   Procedure: TOTAL KNEE ARTHROPLASTY;  Surgeon: Sheral Apley, MD;  Location: [facility] OR;  Service: Orthopedics;  Laterality: Right;    Family History  Problem Relation Age of Onset   Heart disease Father 73       AMI   Liver cancer Mother    Cancer Mother 49       Colon cancer with liver mets   Heart disease Brother 52       AMI   Heart disease Maternal Grandmother     Social History   Tobacco Use   Smoking status: Former    Types: Cigars   Smokeless tobacco: Current    Types: Snuff  Substance Use Topics   Alcohol use: Yes    Alcohol/week: 1.0 - 2.0 standard drink    Types: 1 - 2 Cans of beer per week    Comment: occ   Drug use: No     Home Medications Prior to Admission medications   Medication Sig Start Date End Date Taking? Authorizing Provider  predniSONE (STERAPRED UNI-PAK 21 TAB) 10 MG (21) TBPK tablet 10mg  Tabs, 6 day taper. Use as directed 03/01/21  Yes 14/6/22, MD  acetaminophen (TYLENOL) 650 MG CR tablet Take 650 mg by mouth every 8 (eight) hours as needed for pain.    [provider]  atorvastatin (LIPITOR) 40 MG tablet TAKE 1 TABLET BY MOUTH EVERY DAY  AT [facility] 07/06/20   Camnitz, Andree Coss, MD  carvedilol (COREG) 12.5 MG tablet TAKE 1 TABLET BY MOUTH TWICE A DAY 03/25/20   Camnitz, Andree Coss, MD  cholecalciferol (VITAMIN D) 25 MCG (1000 UNIT) tablet Take 1,000 Units by mouth daily.    [provider]  cyclobenzaprine (FLEXERIL) 5 MG tablet Take 1 tablet (5 mg total) by mouth 3 (three) times daily as needed. 12/29/20   Charlynne Pander, MD  ELIQUIS 5 MG TABS tablet TAKE 1 TABLET BY MOUTH TWICE A DAY 12/31/20   Camnitz, Andree Coss, MD  ibuprofen (ADVIL) 200 MG tablet Take 200 mg by mouth daily.    [provider]  lisinopril (ZESTRIL) 40 MG tablet Take 1 tablet (40 mg total) by mouth daily. Please schedule yearly appointment for future refills. Thank you 01/14/21    Regan Lemming, MD     Allergies    Patient has no known allergies.   Review of Systems   Review of Systems A comprehensive review of systems was completed and negative except as noted in HPI.    Physical Exam BP 126/75   Pulse 63   Temp 98.5 F (36.9 C) (Oral)   Resp 20   Ht 5\' 11"  (1.803 m)   Wt 129.3 kg   SpO2 97%   BMI 39.75 kg/m   Physical Exam Vitals and nursing note reviewed.  Constitutional:      Appearance: Normal appearance. He is ill-appearing.  HENT:     Head: Normocephalic and atraumatic.     Nose: Nose normal.     Mouth/Throat:     Mouth: Mucous membranes are moist.  Eyes:     Extraocular Movements: Extraocular movements intact.     Conjunctiva/sclera: Conjunctivae normal.  Cardiovascular:     Rate and Rhythm: Normal rate.  Pulmonary:     Effort: Pulmonary effort is normal.     Breath sounds: Wheezing and rhonchi present.  Abdominal:     General: Abdomen is flat.     Palpations: Abdomen is soft.     Tenderness: There is no abdominal tenderness.  Musculoskeletal:        General: No swelling. Normal range of motion.     Cervical back: Neck supple.  Skin:    General: Skin is warm and dry.  Neurological:     General: No focal deficit present.     Mental Status: He is alert.     Cranial Nerves: No cranial nerve deficit.     Sensory: No sensory deficit.     Motor: No weakness.  Psychiatric:        Mood and Affect: Mood normal.     ED Results / Procedures / Treatments   Labs (all labs ordered are listed, but only abnormal results are displayed) Labs Reviewed  RESP PANEL BY RT-PCR (FLU A&B, COVID) ARPGX2 - Abnormal; Notable for the following components:      Result Value   Influenza A by PCR POSITIVE (*)    All other components within normal limits  COMPREHENSIVE METABOLIC PANEL - Abnormal; Notable for the following components:   Glucose, Bld 121 (*)    Total Bilirubin 1.7 (*)    All other components within normal limits  I-STAT  ARTERIAL BLOOD GAS, ED - Abnormal; Notable for the following components:   pO2, Arterial 70 (*)    All other components within normal limits  CBC  LACTIC ACID, PLASMA  PROTIME-INR    EKG None   Radiology DG  Chest 2 View  Result Date: 03/01/2021 CLINICAL DATA:  Fatigue EXAM: CHEST - 2 VIEW COMPARISON:  Chest x-ray 12/28/2020 FINDINGS: Heart is enlarged. Mediastinum appears stable. Mild calcified plaques in the aortic arch. Left-sided cardiac pacemaker device. Pulmonary vasculature appears within normal limits. Linear opacities in the bilateral lung bases may represent subsegmental atelectasis. No pleural effusion or pneumothorax visualized. IMPRESSION: Cardiomegaly.  Bibasilar likely subsegmental atelectasis. Electronically Signed   By: Jannifer Hick M.D.   On: 03/01/2021 17:20    Procedures Procedures  Medications Ordered in the ED Medications  albuterol (VENTOLIN HFA) 108 (90 Base) MCG/ACT inhaler 2 puff (has no administration in time range)  ipratropium-albuterol (DUONEB) 0.5-2.5 (3) MG/3ML nebulizer solution 3 mL (3 mLs Nebulization Given 03/01/21 1756)  methylPREDNISolone sodium succinate (SOLU-MEDROL) 125 mg/2 mL injection 125 mg (125 mg Intravenous Given 03/01/21 1741)     MDM Rules/Calculators/A&P MDM Patient here with positive flu test, increased cough and SOB today. Will give a neb, check ABG for hypercapnia given confusion. Begin steroids for wheezing. Labs otherwise unremarkable.   ED Course  I have reviewed the triage vital signs and the nursing notes.  Pertinent labs & imaging results that were available during my care of the patient were reviewed by me and considered in my medical decision making (see chart for details).  Clinical Course as of 03/01/21 1952  Tue Mar 01, 2021  1734 CXR without PNA.  [CS]  1754 ABG without any hypercapnia or resp acidosis.  [CS]  1947 Patient feeling better, wheezing improved. More alert now. He is able to ambulate without  significant hypoxia or tachycardia and would like to go home. Will give HFA, short course of steroids, outside of window for Tamiflu to be affective. PCP follow up, RTED for worsening.  [CS]    Clinical Course User Index [CS] Pollyann Savoy, MD    Final Clinical Impression(s) / ED Diagnoses Final diagnoses:  Influenza A  Bronchitis    Rx / DC Orders ED Discharge Orders          Ordered    predniSONE (STERAPRED UNI-PAK 21 TAB) 10 MG (21) TBPK tablet        03/01/21 1951             Pollyann Savoy, MD 03/01/21 1952

## 2021-03-01 NOTE — Progress Notes (Signed)
Pt walked around emergency department while on 21% room air.  SpO2 remained greater than or equal to 93% throughout the entire trip, Heart rate never climbed higher than 103bpm, and respiratory rate stayed around 25 for the duration of walk.  MD made aware.

## 2021-03-03 NOTE — Progress Notes (Signed)
Remote pacemaker transmission.   

## 2021-03-06 NOTE — Progress Notes (Signed)
Cardiology Office Note Date:  03/09/2021  Patient ID:  Carden, Teel 10/17/1950, MRN [id number] PCP:  Donita Brooks, MD  Cardiologist:  Dr. Duke Salvia Electrophysiologist: Dr. Elberta Fortis   Chief Complaint:  annual visit  History of Present Illness: Harold Robertson is a 70 y.o. male with history of HTN, obesity, OSA compliant with CPAP, CHB, NICM w/CRTp, AFib  He comes today to be seen for Dr. Elberta Fortis, last seen by him Dec 2021, doing well, cleared for knee surgery with intermediate cardiac risk   He is getting back on track after the flue a week or so ago, and his knee was bothering his as well. Outside of those things though he has been doing well Had an ER visit in oct for CP that was felt to be musciloskeletal with neg Trops. He went on vacation afterwards and did go away. None since  No unusual SOB No near syncope or syncope No bleeding or signs of bleeding  He is over due for his PMD and annual labs   Device information: SJM CRT-P, implanted 09/09/15, Dr. Elberta Fortis, CHB, NICM PACER DEPENDENT  Afib Hx Diagnosed 11/2019  AAD None to date   Past Medical History:  Diagnosis Date   Arthritis    Complete heart block (HCC)    Hypertension    OSA on CPAP    Presence of permanent cardiac pacemaker     Past Surgical History:  Procedure Laterality Date   APPENDECTOMY  1960s   CARDIAC CATHETERIZATION Right 09/08/2015   Procedure: Temporary Pacemaker;  Surgeon: Will Jorja Loa, MD;  [facility]: [facility];  Service: Cardiovascular;  Laterality: Right;   CARDIAC CATHETERIZATION N/A 09/09/2015   Procedure: Left Heart Cath and Coronary Angiography;  Surgeon: Peter M Swaziland, MD;  Location: [facility] INVASIVE CV LAB;  Service: Cardiovascular;  Laterality: N/A;   CARDIOVERSION N/A 01/28/2020   Procedure: CARDIOVERSION;  Surgeon: Jake Bathe, MD;  Location: [facility] ENDOSCOPY;  Service: Cardiovascular;  Laterality: N/A;   CHEILECTOMY Left 03/15/2018   Procedure:  CHEILECTOMY LEFT FOOT;  Surgeon: Sheral Apley, MD;  Location: [facility];  Service: Orthopedics;  Laterality: Left;   COLONOSCOPY     EP IMPLANTABLE DEVICE N/A 09/09/2015   Procedure: BiV Pacemaker Insertion CRT-P;  Surgeon: Will Jorja Loa, MD;  Location: [facility];  Service: Cardiovascular;  Laterality: N/A;   KNEE SURGERY Right    TOTAL KNEE ARTHROPLASTY Right 07/11/2016   TOTAL KNEE ARTHROPLASTY Right 07/11/2016   Procedure: TOTAL KNEE ARTHROPLASTY;  Surgeon: Sheral Apley, MD;  Location: [facility] OR;  Service: Orthopedics;  Laterality: Right;    Current Outpatient Medications  Medication Sig Dispense Refill   acetaminophen (TYLENOL) 650 MG CR tablet Take 650 mg by mouth every 8 (eight) hours as needed for pain.     atorvastatin (LIPITOR) 40 MG tablet TAKE 1 TABLET BY MOUTH EVERY DAY AT 6PM 90 tablet 1   carvedilol (COREG) 12.5 MG tablet TAKE 1 TABLET BY MOUTH TWICE A DAY 180 tablet 3   cholecalciferol (VITAMIN D) 25 MCG (1000 UNIT) tablet Take 1,000 Units by mouth daily.     cyclobenzaprine (FLEXERIL) 5 MG tablet Take 1 tablet (5 mg total) by mouth 3 (three) times daily as needed. 10 tablet 0   ELIQUIS 5 MG TABS tablet TAKE 1 TABLET BY MOUTH TWICE A DAY 180 tablet 1   ibuprofen (ADVIL) 200 MG tablet Take 200 mg by mouth daily.     lisinopril (  ZESTRIL) 40 MG tablet Take 1 tablet (40 mg total) by mouth daily. Please schedule yearly appointment for future refills. Thank you 60 tablet 0   MEDROL 4 MG TBPK tablet Take 4 mg by mouth daily.     No current facility-administered medications for this visit.    Allergies:   Patient has no known allergies.   Social History:  The patient  reports that he has quit smoking. His smoking use included cigars. His smokeless tobacco use includes snuff. He reports current alcohol use of about 1.0 - 2.0 standard drink per week. He reports that he does not use drugs.   Family History:  The patient's family history includes  Cancer (age of onset: 64) in his mother; Heart disease in his maternal grandmother; Heart disease (age of onset: 23) in his brother; Heart disease (age of onset: 14) in his father; Liver cancer in his mother.  ROS:  Please see the history of present illness.  All other systems are reviewed and otherwise negative.   PHYSICAL EXAM:  VS:  BP 122/78   Pulse 69   Ht 5\' 11"  (1.803 m)   Wt 290 lb 3.2 oz (131.6 kg)   SpO2 96%   BMI 40.47 kg/m  BMI: Body mass index is 40.47 kg/m. Well nourished, well developed, in no acute distress  HEENT: normocephalic, atraumatic  Neck: no JVD, carotid bruits or masses Cardiac:  RRR no significant murmurs, no rubs, or gallops Lungs:  CTA b/l, no wheezing, rhonchi or rales  Abd: soft, nontender MS: no deformity or atrophy Ext: no edema  Skin: warm and dry, no rash Neuro:  No gross deficits appreciated Psych: euthymic mood, full affect  PPM site is stable, well healed, no tethering or discomfort   EKG:  not done  Device interrogation done today and reviewed by myself  Battery estimate to ERI is 6.6mo Lead measurements are good 4 AMS episodes, none are AFib, one looks like fleeting external noise on both EGMs No HVR episodes  02/01/2018 TTE Study Conclusions  - Left ventricle: Abnormal septal motion. The cavity size was    normal. Wall thickness was increased in a pattern of mild LVH.    Systolic function was normal. The estimated ejection fraction was    in the range of 55% to 60%. Wall motion was normal; there were no    regional wall motion abnormalities. Doppler parameters are    consistent with abnormal left ventricular relaxation (grade 1    diastolic dysfunction).  - Left atrium: The atrium was mildly dilated.  - Atrial septum: No defect or patent foramen ovale was identified.    1. 09/09/15: [facility], Dr. 09/11/15    Conclusion     Prox RCA lesion, 20% stenosed. Prox LAD to Mid LAD lesion, 40% stenosed. Mid Cx lesion, 20% stenosed. Ost  2nd Mrg to 2nd Mrg lesion, 20% stenosed. 1st Diag lesion, 30% stenosed. There is mild to moderate left ventricular systolic dysfunction.   1. Nonobstructive CAD 2. Mild to moderate LV dysfunction. EF 40-45%.   Plan: proceed with permanent pacemaker implant. Risk factor modification with statin therapy.   09/08/15: TTE Study Conclusions - Left ventricle: The cavity size was normal. There was mild   concentric hypertrophy. Systolic function was mildly to   moderately reduced. The estimated ejection fraction was in the   range of 40% to 45%. Diffuse hypokinesis. - Mitral valve: There was mild to moderate regurgitation. - Left atrium: The atrium was severely dilated. 70mm -  Right ventricle: The cavity size was mildly dilated. Wall   thickness was normal. Systolic function was moderately reduced. - Right atrium: The atrium was severely dilated  Recent Labs: 03/01/2021: ALT 20; BUN 15; Creatinine, Ser 0.93; Hemoglobin 13.3; Platelets 183; Potassium 4.0; Sodium 138  No results found for requested labs within last 8760 hours.   Estimated Creatinine Clearance: 103.7 mL/min (by C-G formula based on SCr of 0.93 mg/dL).   Wt Readings from Last 3 Encounters:  03/09/21 290 lb 3.2 oz (131.6 kg)  03/01/21 285 lb (129.3 kg)  03/25/20 291 lb (132 kg)     Other studies reviewed: Additional studies/records reviewed today include: summarized above  ASSESSMENT AND PLAN:  1. CHB w/ PPM (CRT-P)     normal device function     No changes made     Est to ERI is 79mo, monthly battery checks scheduled  2. NICM  Recovered LVEF    No exam findings or symptoms of volume OL    CorVue looks ok    No symptoms  3. HTN     Stable       4. Non-obstructive CAD     No symptoms     He will get his labs done with his PMD  5. Paroxysmal Afib CHA2DS2Vasc is 3, on Eliquis, appropriately dosed 0 % true burden    Disposition: I have asked device clinic to get him on monthly battery remotes, will have hi  back in clinic in 76mo, sooner if needed  Current medicines are reviewed at length with the patient today.  The patient did not have any concerns regarding medicines.  Judith Blonder, PA-C 03/09/2021 9:21 AM     [facility] [address] [place] [address] [phone] (office)  [phone] (fax)

## 2021-03-07 DIAGNOSIS — M25561 Pain in right knee: Secondary | ICD-10-CM | POA: Diagnosis not present

## 2021-03-09 ENCOUNTER — Encounter: Payer: Self-pay | Admitting: Physician Assistant

## 2021-03-09 ENCOUNTER — Ambulatory Visit (INDEPENDENT_AMBULATORY_CARE_PROVIDER_SITE_OTHER): Payer: Medicare Other | Admitting: Physician Assistant

## 2021-03-09 ENCOUNTER — Other Ambulatory Visit: Payer: Self-pay

## 2021-03-09 VITALS — BP 122/78 | HR 69 | Ht 71.0 in | Wt 290.2 lb

## 2021-03-09 DIAGNOSIS — I442 Atrioventricular block, complete: Secondary | ICD-10-CM | POA: Diagnosis not present

## 2021-03-09 DIAGNOSIS — I480 Paroxysmal atrial fibrillation: Secondary | ICD-10-CM | POA: Diagnosis not present

## 2021-03-09 DIAGNOSIS — I428 Other cardiomyopathies: Secondary | ICD-10-CM | POA: Diagnosis not present

## 2021-03-09 DIAGNOSIS — I10 Essential (primary) hypertension: Secondary | ICD-10-CM

## 2021-03-09 DIAGNOSIS — Z950 Presence of cardiac pacemaker: Secondary | ICD-10-CM

## 2021-03-09 LAB — CUP PACEART INCLINIC DEVICE CHECK
Battery Remaining Longevity: 6 mo
Battery Voltage: 2.83 V
Brady Statistic RA Percent Paced: 89 %
Brady Statistic RV Percent Paced: 99.73 %
Date Time Interrogation Session: 20221214093348
Implantable Lead Implant Date: 20170615
Implantable Lead Implant Date: 20170615
Implantable Lead Implant Date: 20170615
Implantable Lead Location: 753858
Implantable Lead Location: 753859
Implantable Lead Location: 753860
Implantable Pulse Generator Implant Date: 20170615
Lead Channel Impedance Value: 475 Ohm
Lead Channel Impedance Value: 525 Ohm
Lead Channel Impedance Value: 812.5 Ohm
Lead Channel Pacing Threshold Amplitude: 0.625 V
Lead Channel Pacing Threshold Amplitude: 0.875 V
Lead Channel Pacing Threshold Amplitude: 1.625 V
Lead Channel Pacing Threshold Pulse Width: 0.4 ms
Lead Channel Pacing Threshold Pulse Width: 0.4 ms
Lead Channel Pacing Threshold Pulse Width: 0.8 ms
Lead Channel Sensing Intrinsic Amplitude: 12 mV
Lead Channel Sensing Intrinsic Amplitude: 4.5 mV
Lead Channel Setting Pacing Amplitude: 1.625
Lead Channel Setting Pacing Amplitude: 2 V
Lead Channel Setting Pacing Amplitude: 2.625
Lead Channel Setting Pacing Pulse Width: 0.4 ms
Lead Channel Setting Pacing Pulse Width: 0.8 ms
Lead Channel Setting Sensing Sensitivity: 5 mV
Pulse Gen Model: 3262
Pulse Gen Serial Number: 7894586

## 2021-03-09 NOTE — Patient Instructions (Signed)

## 2021-03-14 DIAGNOSIS — Z96651 Presence of right artificial knee joint: Secondary | ICD-10-CM | POA: Diagnosis not present

## 2021-03-14 DIAGNOSIS — M25561 Pain in right knee: Secondary | ICD-10-CM | POA: Diagnosis not present

## 2021-03-16 ENCOUNTER — Telehealth: Payer: Self-pay | Admitting: *Deleted

## 2021-03-16 DIAGNOSIS — M25561 Pain in right knee: Secondary | ICD-10-CM | POA: Diagnosis not present

## 2021-03-16 DIAGNOSIS — I2510 Atherosclerotic heart disease of native coronary artery without angina pectoris: Secondary | ICD-10-CM | POA: Insufficient documentation

## 2021-03-16 NOTE — Telephone Encounter (Signed)
° °  Pre-operative Risk Assessment    Patient Name: Harold Robertson  DOB: 1950-12-18 MRN: [id number]      Request for Surgical Clearance    Procedure:   I&D RIGHT KNEE INFECTION  Date of Surgery:  Clearance 03/22/21                                 Surgeon:  DR. Margarita Rana Surgeon's Group or Practice Name:  [facility] Phone number:  [phone] Fax number:  [phone] ATTN: KELLY EXT [id number]   Type of Clearance Requested:   - Medical  - Pharmacy:  Hold Apixaban (Eliquis)      Type of Anesthesia:   CHOICE   Additional requests/questions:   PROBABLE PICC LINE PLACEMENT TODAY  Elpidio Anis   03/16/2021, 12:33 PM

## 2021-03-16 NOTE — Telephone Encounter (Signed)
Patient with diagnosis of afib on Eliquis for anticoagulation.    Procedure: requests states I&D but pt is scheduled for TKA as well in Epic Date of procedure: 03/22/21  CHA2DS2-VASc Score = 4  This indicates a 4.8% annual risk of stroke. The patient's score is based upon: CHF History: 1 HTN History: 1 Diabetes History: 0 Stroke History: 0 Vascular Disease History: 1 Age Score: 1 Gender Score: 0   CrCl 140mL/min Platelet count 183K  Per office protocol, patient can hold Eliquis for 3 days prior to procedure.

## 2021-03-16 NOTE — Telephone Encounter (Signed)
° °  Primary Cardiologist: Chilton Si, MD  Chart reviewed as part of pre-operative protocol coverage. Given past medical history and time since last visit, based on ACC/AHA guidelines, Harold Robertson would be at acceptable risk for the planned procedure without further cardiovascular testing.   Patient with diagnosis of afib on Eliquis for anticoagulation.     Procedure: requests states I&D but pt is scheduled for TKA as well in Epic Date of procedure: 03/22/21   CHA2DS2-VASc Score = 4  This indicates a 4.8% annual risk of stroke. The patient's score is based upon: CHF History: 1 HTN History: 1 Diabetes History: 0 Stroke History: 0 Vascular Disease History: 1 Age Score: 1 Gender Score: 0   CrCl 13mL/min Platelet count 183K   Per office protocol, patient can hold Eliquis for 3 days prior to procedure.  I will route this recommendation to the requesting party via Epic fax function and remove from pre-op pool.  Please call with questions.  Thomasene Ripple. Jelesa Mangini NP-C    03/16/2021, 4:46 PM [facility] [address] Office [phone] Fax [phone]

## 2021-03-17 ENCOUNTER — Encounter: Payer: Self-pay | Admitting: Cardiology

## 2021-03-17 ENCOUNTER — Other Ambulatory Visit (HOSPITAL_COMMUNITY): Payer: Self-pay

## 2021-03-17 ENCOUNTER — Encounter: Payer: Self-pay | Admitting: Family Medicine

## 2021-03-17 ENCOUNTER — Ambulatory Visit (INDEPENDENT_AMBULATORY_CARE_PROVIDER_SITE_OTHER): Payer: Medicare Other | Admitting: Family Medicine

## 2021-03-17 ENCOUNTER — Encounter (HOSPITAL_COMMUNITY): Payer: Self-pay | Admitting: Orthopedic Surgery

## 2021-03-17 ENCOUNTER — Other Ambulatory Visit: Payer: Self-pay

## 2021-03-17 VITALS — BP 122/68 | HR 70 | Temp 98.4°F | Resp 18 | Ht 71.0 in | Wt 283.0 lb

## 2021-03-17 DIAGNOSIS — N401 Enlarged prostate with lower urinary tract symptoms: Secondary | ICD-10-CM

## 2021-03-17 DIAGNOSIS — I5020 Unspecified systolic (congestive) heart failure: Secondary | ICD-10-CM | POA: Diagnosis not present

## 2021-03-17 DIAGNOSIS — E7800 Pure hypercholesterolemia, unspecified: Secondary | ICD-10-CM

## 2021-03-17 DIAGNOSIS — R399 Unspecified symptoms and signs involving the genitourinary system: Secondary | ICD-10-CM | POA: Diagnosis not present

## 2021-03-17 DIAGNOSIS — Z125 Encounter for screening for malignant neoplasm of prostate: Secondary | ICD-10-CM | POA: Diagnosis not present

## 2021-03-17 DIAGNOSIS — I480 Paroxysmal atrial fibrillation: Secondary | ICD-10-CM | POA: Diagnosis not present

## 2021-03-17 DIAGNOSIS — R3914 Feeling of incomplete bladder emptying: Secondary | ICD-10-CM

## 2021-03-17 DIAGNOSIS — Z0000 Encounter for general adult medical examination without abnormal findings: Secondary | ICD-10-CM

## 2021-03-17 DIAGNOSIS — Z0001 Encounter for general adult medical examination with abnormal findings: Secondary | ICD-10-CM

## 2021-03-17 DIAGNOSIS — I2510 Atherosclerotic heart disease of native coronary artery without angina pectoris: Secondary | ICD-10-CM

## 2021-03-17 NOTE — Progress Notes (Addendum)
COVID swab appointment: 03-18-21  COVID Vaccine Completed:  Yes x2 Date COVID Vaccine completed:  04-15-19 05-06-19 Has received booster:  Yes x1 COVID vaccine manufacturer: Pfizer      Date of COVID positive in last 90 days:  No  PCP - Lynnea Ferrier, MD appt 03-17-21 Cardiologist - Chilton Si, MD Electrophysiology - Loman Brooklyn, MD  Cardiac clearance in Epic dated 03-16-21 by Edd Fabian, NP-C  Chest x-ray - 03-01-21 Epic EKG - 03-07-21 Epic Stress Test - N/A ECHO - greater than 2 years Epic Cardiac Cath - greater than 2 years Epic Pacemaker/ICD device last checked:  03-09-21 Epic.  Orders requested Spinal Cord Stimulator:  Sleep Study - Yes, +sleep apnea CPAP - Yes  Fasting Blood Sugar - N/A Checks Blood Sugar _____ times a day  Blood Thinner Instructions: Eliquis.  Hold x3 days.  Patient is aware Aspirin Instructions: Last Dose:  Activity level:   Can go up a flight of stairs and perform activities of daily living without stopping and without symptoms of chest pain or shortness of breath.  Some limitations due to knee pain   Anesthesia review:  LBBB, complete heart block, Afib/flutter, CAD, OSA, HTN.  Pacemaker   Positive Influenza A and bronchitis 03-01-21.  Shortness of breath and congestion has resolved.  States that he continues to have a cough but that is much better and very minimal today    Patient denies shortness of breath, fever, and chest pain at PAT appointment (completed over the phone)  Patient verbalized understanding of instructions that were given to them at the PAT appointment. Patient was also instructed that they will need to review over the PAT instructions again at home before surgery.

## 2021-03-17 NOTE — Progress Notes (Signed)
Subjective:    Patient ID: Harold Robertson, male    DOB: [birth year], 70 y.o.   MRN: [id number]  HPI  Patient is here today for complete physical exam.  Past medical history is significant for atrioventricular block, complete heart block, requiring pacemaker placement.  Apparently he was hospitalized with a heart rate in the 20s per his report requiring pacemaker placement.  At that time in 2017, his ejection fraction was 40 to 45% on echocardiogram.  He is done well since his pacemaker has been placed and follows up regularly with his EP specialist Dr. Elberta Fortis.    Had colonoscopy in 2016 with a equal.  There were several polyps.  They recommended a repeat colonoscopy in 5 years. Unfortunately, the patient is battling pain in his right knee.  He has been told that he had septic arthritis in his right knee.  He has a history of knee replacement.  He is seeing his orthopedist and believes that he is going to have a PICC line placed to be on antibiotics for 6 weeks.  He would like to see his gastroenterologist to schedule colonoscopy.  However, given ongoing infection in his knee joint, I have recommended that we wait until he is seen resolution of this before he has any type of elective surgical procedure.  The patient agrees with this.  He also reports tinnitus.  He has a history of high-frequency hearing loss and wears hearing aids.  However he has not been wearing his hearing aid recently.  On examination today there is no cerumen impaction.  Both eardrums appear normal.  Therefore I believe that the tinnitus is most likely related to age-related hearing loss.  He denies any vertigo.  He denies any dizziness.  He denies any headaches.  He recently had the flu last week.  He has not quite recovered from that yet.  He is due for the high-dose flu shot.  He is also due for Shingrix.  He is also due for the most recent COVID shot.  Plan from that, he is doing well.  He does report some lower urinary tract  symptoms.  He reports increased frequency and nocturia but he also reports weak stream and incomplete bladder evacuation.   Immunization History  Administered Date(s) Administered   Fluad Quad(high Dose 65+) 12/04/2018   Influenza Split 01/31/2013, 03/17/2015   Influenza, High Dose Seasonal PF 12/18/2017   Influenza,inj,Quad PF,6+ Mos 01/14/2016, 11/16/2016   Influenza-Unspecified 01/02/2014   PFIZER(Purple Top)SARS-COV-2 Vaccination 04/15/2019, 05/06/2019   Pneumococcal Conjugate-13 11/04/2018   Pneumococcal Polysaccharide-23 01/28/2018   Tdap 01/14/2016, 05/11/2016    Past Medical History:  Diagnosis Date   Arthritis    Atrial fibrillation (HCC)    Complete heart block (HCC)    Hypertension    OSA on CPAP    Presence of permanent cardiac pacemaker    Past Surgical History:  Procedure Laterality Date   APPENDECTOMY  1960s   CARDIAC CATHETERIZATION Right 09/08/2015   Procedure: Temporary Pacemaker;  Surgeon: Will Jorja Loa, MD;  Location: [facility];  Service: Cardiovascular;  Laterality: Right;   CARDIAC CATHETERIZATION N/A 09/09/2015   Procedure: Left Heart Cath and Coronary Angiography;  Surgeon: Peter M Swaziland, MD;  Location: [facility] INVASIVE CV LAB;  Service: Cardiovascular;  Laterality: N/A;   CARDIOVERSION N/A 01/28/2020   Procedure: CARDIOVERSION;  Surgeon: Jake Bathe, MD;  Location: [facility] ENDOSCOPY;  Service: Cardiovascular;  Laterality: N/A;   CHEILECTOMY Left 03/15/2018   Procedure: CHEILECTOMY LEFT  FOOT;  Surgeon: Sheral Apley, MD;  Location: [facility];  Service: Orthopedics;  Laterality: Left;   COLONOSCOPY     EP IMPLANTABLE DEVICE N/A 09/09/2015   Procedure: BiV Pacemaker Insertion CRT-P;  Surgeon: Will Jorja Loa, MD;  Location: [facility];  Service: Cardiovascular;  Laterality: N/A;   KNEE SURGERY Right    TOTAL KNEE ARTHROPLASTY Right 07/11/2016   TOTAL KNEE ARTHROPLASTY Right 07/11/2016   Procedure: TOTAL KNEE  ARTHROPLASTY;  Surgeon: Sheral Apley, MD;  Location: [facility] OR;  Service: Orthopedics;  Laterality: Right;   Current Outpatient Medications on File Prior to Visit  Medication Sig Dispense Refill   acetaminophen (TYLENOL) 650 MG CR tablet Take 650 mg by mouth every 8 (eight) hours as needed for pain.     atorvastatin (LIPITOR) 40 MG tablet TAKE 1 TABLET BY MOUTH EVERY DAY AT 6PM 90 tablet 1   carvedilol (COREG) 12.5 MG tablet TAKE 1 TABLET BY MOUTH TWICE A DAY 180 tablet 3   cholecalciferol (VITAMIN D) 25 MCG (1000 UNIT) tablet Take 1,000 Units by mouth daily.     ELIQUIS 5 MG TABS tablet TAKE 1 TABLET BY MOUTH TWICE A DAY 180 tablet 1   ibuprofen (ADVIL) 200 MG tablet Take 200 mg by mouth daily.     lisinopril (ZESTRIL) 40 MG tablet Take 1 tablet (40 mg total) by mouth daily. Please schedule yearly appointment for future refills. Thank you 60 tablet 0   No current facility-administered medications on file prior to visit.   No Known Allergies Social History   Socioeconomic History   Marital status: Married    Spouse name: Not on file   Number of children: Not on file   Years of education: Not on file   Highest education level: Not on file  Occupational History   Occupation: truck driver  Tobacco Use   Smoking status: Former    Types: Cigars   Smokeless tobacco: Current    Types: Snuff  Vaping Use   Vaping Use: Never used  Substance and Sexual Activity   Alcohol use: Yes    Alcohol/week: 1.0 standard drink    Types: 1 Cans of beer per week    Comment: occ   Drug use: No   Sexual activity: Yes  Other Topics Concern   Not on file  Social History Narrative   Marital status: Married x 30 years; first marriage     Children: 2 children; no grandchildren     Lives: lives with wife      Employment: truck driver x 14 years; [place]        Tobacco:  None; pipes and cigars; chews tobacco.       Alcohol:  Weekends.      Drugs:  None      Exercise:  Rarely.      Seatbelt:   100%      Guns:  Loaded and secured.   Social Determinants of Health   Financial Resource Strain: Not on file  Food Insecurity: Not on file  Transportation Needs: Not on file  Physical Activity: Not on file  Stress: Not on file  Social Connections: Not on file  Intimate Partner Violence: Not on file   Family History  Problem Relation Age of Onset   Heart disease Father 91       AMI   Liver cancer Mother    Cancer Mother 22       Colon cancer with liver  mets   Heart disease Brother 50       AMI   Heart disease Maternal Grandmother       Review of Systems  All other systems reviewed and are negative.     Objective:   Physical Exam Vitals reviewed.  Constitutional:      General: He is not in acute distress.    Appearance: Normal appearance. He is well-developed. He is obese. He is not ill-appearing, toxic-appearing or diaphoretic.  HENT:     Head: Normocephalic and atraumatic.     Right Ear: Tympanic membrane, ear canal and external ear normal.     Left Ear: Tympanic membrane, ear canal and external ear normal.     Nose: Nose normal. No congestion or rhinorrhea.     Mouth/Throat:     Pharynx: Oropharynx is clear. No oropharyngeal exudate or posterior oropharyngeal erythema.  Eyes:     General: No scleral icterus.       Right eye: No discharge.        Left eye: No discharge.     Extraocular Movements: Extraocular movements intact.     Conjunctiva/sclera: Conjunctivae normal.     Pupils: Pupils are equal, round, and reactive to light.  Neck:     Thyroid: No thyromegaly.     Vascular: No carotid bruit.  Cardiovascular:     Rate and Rhythm: Normal rate and regular rhythm.     Pulses: Normal pulses.     Heart sounds: Normal heart sounds. No murmur heard.   No friction rub. No gallop.  Pulmonary:     Effort: Pulmonary effort is normal. No respiratory distress.     Breath sounds: Normal breath sounds. No stridor. No wheezing, rhonchi or rales.  Chest:     Chest  wall: No tenderness.  Abdominal:     General: Bowel sounds are normal. There is no distension.     Palpations: Abdomen is soft. There is no mass.     Tenderness: There is no abdominal tenderness. There is no guarding or rebound.     Hernia: No hernia is present.  Musculoskeletal:        General: No deformity.     Cervical back: Normal range of motion and neck supple. No rigidity. No muscular tenderness.     Right lower leg: No edema.     Left lower leg: No edema.  Lymphadenopathy:     Cervical: No cervical adenopathy.  Skin:    Coloration: Skin is not jaundiced.     Findings: No bruising, erythema, lesion or rash.  Neurological:     General: No focal deficit present.     Mental Status: He is alert and oriented to person, place, and time.     Cranial Nerves: No cranial nerve deficit.     Sensory: No sensory deficit.     Motor: No weakness.     Coordination: Coordination normal.     Gait: Gait normal.     Deep Tendon Reflexes: Reflexes normal.  Psychiatric:        Mood and Affect: Mood normal.        Behavior: Behavior normal.        Thought Content: Thought content normal.        Judgment: Judgment normal.          Assessment & Plan:  General medical exam  Prostate cancer screening - Plan: PSA, Medicare  Systolic congestive heart failure, NYHA class 1, unspecified congestive heart failure chronicity (HCC)  PAF (  paroxysmal atrial fibrillation) (HCC) - Plan: CBC with Differential/Platelet, COMPLETE METABOLIC PANEL WITH GFR, Lipid panel  Coronary artery disease involving native heart without angina pectoris, unspecified vessel or lesion type - Plan: CBC with Differential/Platelet, COMPLETE METABOLIC PANEL WITH GFR, Lipid panel  Pure hypercholesterolemia - Plan: CBC with Differential/Platelet, COMPLETE METABOLIC PANEL WITH GFR, Lipid panel  Lower urinary tract symptoms (LUTS) The most pressing issue for the patient at the present time is septic arthritis in his right  knee.  I recommend waiting until there is resolution of the issue in his right knee before performing elective colonoscopy.  He can call me back at any time and I will be happy to schedule him to see GI but I would wait until this issue resolved first to avoid any other complications.  Regarding cancer screening he is also due for prostate cancer screening so we will check a PSA.  He has symptoms consistent with lower urinary tract BPH.  Therefore if his PSA is within normal limits, I would try Flomax 0.4 mg p.o. nightly to see if it would help with his symptoms.  I recommended a high-dose flu shot, Shingrix, and COVID booster.  I will check a CBC CMP and lipid panel in addition to the PSA.  Regarding his heart failure, he is recently seen his cardiologist and I will defer to their management however he is asymptomatic.  He is on Eliquis although he is in normal sinus rhythm today with a paced rhythm.  Therefore he is appropriately anticoagulated.  He has been taking ibuprofen for knee pain I recommended avoiding NSAIDs with his anticoagulant.  He denies any falls or depression or memory loss

## 2021-03-17 NOTE — H&P (Signed)
PREOPERATIVE H&P  Chief Complaint: INFECTED RIGHT TOTAL KNEE REPLACEMENT  HPI: Harold Robertson is a 70 y.o. male who presents with a diagnosis of INFECTED RIGHT TOTAL KNEE REPLACEMENT. This TKA was done in 2018 by Dr. Percell Miller. He has had no issues with the right knee until recently. He was diagnosed with Influenza A in early December and says that his right knee started to hurt shortly after that. Symptoms are rated as moderate to severe, and have been worsening.  This is significantly impairing activities of daily living.  He has elected for surgical management.   Past Medical History:  Diagnosis Date   Arthritis    Complete heart block (HCC)    Hypertension    OSA on CPAP    Presence of permanent cardiac pacemaker    Past Surgical History:  Procedure Laterality Date   APPENDECTOMY  1960s   CARDIAC CATHETERIZATION Right 09/08/2015   Procedure: Temporary Pacemaker;  Surgeon: Will [name], MD;  Location: [facility];  Service: Cardiovascular;  Laterality: Right;   CARDIAC CATHETERIZATION N/A 09/09/2015   Procedure: Left Heart Cath and Coronary Angiography;  Surgeon: [name], MD;  Location: [place] [facility];  Service: Cardiovascular;  Laterality: N/A;   CARDIOVERSION N/A 01/28/2020   Procedure: CARDIOVERSION;  Surgeon: [name], MD;  Location: [facility];  Service: Cardiovascular;  Laterality: N/A;   CHEILECTOMY Left 03/15/2018   Procedure: CHEILECTOMY LEFT FOOT;  Surgeon: [name], MD;  Location: [facility];  Service: Orthopedics;  Laterality: Left;   COLONOSCOPY     EP IMPLANTABLE DEVICE N/A 09/09/2015   Procedure: BiV Pacemaker Insertion CRT-P;  Surgeon: Will [name], MD;  Location: [place] [facility];  Service: Cardiovascular;  Laterality: N/A;   KNEE SURGERY Right    TOTAL KNEE ARTHROPLASTY Right 07/11/2016   TOTAL KNEE ARTHROPLASTY Right 07/11/2016   Procedure: TOTAL KNEE ARTHROPLASTY;  Surgeon: [name], MD;   Location: [facility];  Service: Orthopedics;  Laterality: Right;   Social History   Socioeconomic History   Marital status: Married    Spouse name: Not on file   Number of children: Not on file   Years of education: Not on file   Highest education level: Not on file  Occupational History   Occupation: truck driver  Tobacco Use   Smoking status: Former    Types: Cigars   Smokeless tobacco: Current    Types: Snuff  Vaping Use   Vaping Use: Never used  Substance and Sexual Activity   Alcohol use: Yes    Alcohol/week: 1.0 standard drink    Types: 1 Cans of beer per week    Comment: occ   Drug use: No   Sexual activity: Yes  Other Topics Concern   Not on file  Social History Narrative   Marital status: Married x 30 years; first marriage     Children: 2 children; no grandchildren     Lives: lives with wife      Employment: truck driver x 14 years; [place]        Tobacco:  None; pipes and cigars; chews tobacco.       Alcohol:  Weekends.      Drugs:  None      Exercise:  Rarely.      Seatbelt:  100%      Guns:  Loaded and secured.   Social Determinants of Health   Financial Resource Strain: Not on file  Food  Insecurity: Not on file  Transportation Needs: Not on file  Physical Activity: Not on file  Stress: Not on file  Social Connections: Not on file   Family History  Problem Relation Age of Onset   Heart disease Father 64       AMI   Liver cancer Mother    Cancer Mother 18       Colon cancer with liver mets   Heart disease Brother 59       AMI   Heart disease Maternal Grandmother    No Known Allergies Prior to Admission medications   Medication Sig Start Date End Date Taking? Authorizing Provider  acetaminophen (TYLENOL) 650 MG CR tablet Take 650 mg by mouth every 8 (eight) hours as needed for pain.   Yes [provider]  atorvastatin (LIPITOR) 40 MG tablet TAKE 1 TABLET BY MOUTH EVERY DAY AT 6PM 07/06/20  Yes Camnitz, Will Hassell Done, MD  carvedilol  (COREG) 12.5 MG tablet TAKE 1 TABLET BY MOUTH TWICE A DAY 03/25/20  Yes Camnitz, Will Hassell Done, MD  cholecalciferol (VITAMIN D) 25 MCG (1000 UNIT) tablet Take 1,000 Units by mouth daily.   Yes [provider]  ELIQUIS 5 MG TABS tablet TAKE 1 TABLET BY MOUTH TWICE A DAY 12/31/20  Yes Camnitz, Will Hassell Done, MD  ibuprofen (ADVIL) 200 MG tablet Take 200 mg by mouth daily.   Yes [provider]  lisinopril (ZESTRIL) 40 MG tablet Take 1 tablet (40 mg total) by mouth daily. Please schedule yearly appointment for future refills. Thank you 01/14/21  Yes Camnitz, Ocie Doyne, MD  cyclobenzaprine (FLEXERIL) 5 MG tablet Take 1 tablet (5 mg total) by mouth 3 (three) times daily as needed. Patient not taking: Reported on 03/17/2021 12/29/20   Drenda Freeze, MD     Positive ROS: All other systems have been reviewed and were otherwise negative with the exception of those mentioned in the HPI and as above.  Physical Exam: General: Alert, no acute distress Cardiovascular: No pedal edema Respiratory: No cyanosis, no use of accessory musculature GI: No organomegaly, abdomen is soft and non-tender Skin: No lesions in the area of chief complaint Neurologic: Sensation intact distally Psychiatric: Patient is competent for consent with normal mood and affect Lymphatic: No axillary or cervical lymphadenopathy  MUSCULOSKELETAL: diffuse mild TTP right knee, moderate effusion, no erythema or warmth, well healed surgical incision, painful ROM from 10-70, NVI   Imaging: 4v xrays of the right knee show stable implantation of a prior right TKA with no fracture, dislocation, or obvious abnormality  Ultrasound guidance was used to aspirate the right knee joint. 13cc of cloudy serosanguinous fluid was aspirated and sent for pathology. Results are + Alpha defensin PJI, + Alpha defensins SF, + Neutrophil elastase, CRP >60, RBCs 40,000, WBCs 55,331, 97.8% Neutrophils, and no crystals.  P. Acnes =  negative Staph = positive Candida = indeterminate Enterococcus = negative Presumptive Beta-hemolytic Streptococcus   Assessment: INFECTED RIGHT TOTAL KNEE REPLACEMENT  Plan: Plan for Procedure(s): RIGHT KNEE IRRIGATION AND DEBRIDEMENT OF TOTAL KNEE ARTHROPLASTY WITH REVISION OF COMPONENTS (POLY EXCHANGE)  The risks benefits and alternatives were discussed with the patient including but not limited to the risks of nonoperative treatment, versus surgical intervention including infection, bleeding, nerve injury,  blood clots, cardiopulmonary complications, morbidity, mortality, among others, and they were willing to proceed.   Weightbearing: WBAT RLE Orthopedic devices: might have KI Showering: POD 3 Dressing: keep dry, reinforce PRN Medicines: already on Eliquis so will restart on  POD 1; for pain will do Tylenol, Tramadol, Oxycodone, Dilaudid PRN; Robaxin, Zofran, Colace  Discharge: will be getting admitted and having PICC line placed to receive IV ABX. Already spoke with Dr. Drue Second from ID about patient Follow up: 1-2 weeks after surgery    Marzetta Board Office [phone] 03/17/2021 10:30 AM

## 2021-03-17 NOTE — Addendum Note (Signed)
Addended by: Lynnea Ferrier T on: 03/17/2021 11:52 AM   Modules accepted: Orders

## 2021-03-17 NOTE — Progress Notes (Signed)
Surgery orders requested via Epic inbox. °

## 2021-03-17 NOTE — Progress Notes (Signed)
PERIOPERATIVE PRESCRIPTION FOR IMPLANTED CARDIAC DEVICE PROGRAMMING  Patient Information: Name:  [name]  DOB:  [birth year]  MRN:  [id number]    Planned Procedure:  R total knee revision with I&D  Surgeon:  Eulah Pont  Date of Procedure:  03-22-21  Cautery will be used. Yes  Position during surgery:  supine   Please send documentation back to:  [facility] (Fax # [phone])   Fransico Him, RN  03/17/2021 9:16 AM  Device Information:  Clinic EP Physician:  Loman Brooklyn, MD   Device Type:  Pacemaker Manufacturer and Phone #:  [employer]ott: [phone] Pacemaker Dependent?:  No. Date of Last Device Check:  03/09/2021 Normal Device Function?:  Yes.    Electrophysiologist's Recommendations:  Have magnet available. Provide continuous ECG monitoring when magnet is used or reprogramming is to be performed.  Procedure should not interfere with device function.  No device programming or magnet placement needed.  Per Device Clinic Standing Orders, Dorathy Daft, RN  10:41 AM 03/17/2021

## 2021-03-17 NOTE — Progress Notes (Signed)
Ortho infected joint group notified of pending surgery on 03-22-21 via email.

## 2021-03-18 ENCOUNTER — Other Ambulatory Visit: Payer: Self-pay | Admitting: Orthopedic Surgery

## 2021-03-18 ENCOUNTER — Telehealth: Payer: Self-pay

## 2021-03-18 LAB — COMPLETE METABOLIC PANEL WITH GFR
AG Ratio: 0.8 (calc) — ABNORMAL LOW (ref 1.0–2.5)
ALT: 80 U/L — ABNORMAL HIGH (ref 9–46)
AST: 41 U/L — ABNORMAL HIGH (ref 10–35)
Albumin: 2.9 g/dL — ABNORMAL LOW (ref 3.6–5.1)
Alkaline phosphatase (APISO): 86 U/L (ref 35–144)
BUN: 13 mg/dL (ref 7–25)
CO2: 27 mmol/L (ref 20–32)
Calcium: 8.5 mg/dL — ABNORMAL LOW (ref 8.6–10.3)
Chloride: 100 mmol/L (ref 98–110)
Creat: 0.93 mg/dL (ref 0.70–1.35)
Globulin: 3.6 g/dL (calc) (ref 1.9–3.7)
Glucose, Bld: 108 mg/dL — ABNORMAL HIGH (ref 65–99)
Potassium: 5 mmol/L (ref 3.5–5.3)
Sodium: 137 mmol/L (ref 135–146)
Total Bilirubin: 1.4 mg/dL — ABNORMAL HIGH (ref 0.2–1.2)
Total Protein: 6.5 g/dL (ref 6.1–8.1)
eGFR: 89 mL/min/{1.73_m2} (ref >=60)

## 2021-03-18 LAB — CBC WITH DIFFERENTIAL/PLATELET
Absolute Monocytes: 572 cells/uL (ref 200–950)
Basophils Absolute: 21 cells/uL (ref 0–200)
Basophils Relative: 0.2 %
Eosinophils Absolute: 73 cells/uL (ref 15–500)
Eosinophils Relative: 0.7 %
HCT: 40.1 % (ref 38.5–50.0)
Hemoglobin: 13.2 g/dL (ref 13.2–17.1)
Lymphs Abs: 1082 cells/uL (ref 850–3900)
MCH: 29.1 pg (ref 27.0–33.0)
MCHC: 32.9 g/dL (ref 32.0–36.0)
MCV: 88.3 fL (ref 80.0–100.0)
MPV: 11.2 fL (ref 7.5–12.5)
Monocytes Relative: 5.5 %
Neutro Abs: 8653 cells/uL — ABNORMAL HIGH (ref 1500–7800)
Neutrophils Relative %: 83.2 %
Platelets: 254 10*3/uL (ref 140–400)
RBC: 4.54 10*6/uL (ref 4.20–5.80)
RDW: 11.7 % (ref 11.0–15.0)
Total Lymphocyte: 10.4 %
WBC: 10.4 10*3/uL (ref 3.8–10.8)

## 2021-03-18 LAB — LIPID PANEL
Cholesterol: 92 mg/dL (ref <200)
HDL: 26 mg/dL — ABNORMAL LOW (ref >=40)
LDL Cholesterol (Calc): 51 mg/dL (calc)
Non-HDL Cholesterol (Calc): 66 mg/dL (calc) (ref <130)
Total CHOL/HDL Ratio: 3.5 (calc) (ref <5.0)
Triglycerides: 73 mg/dL (ref <150)

## 2021-03-18 LAB — SARS CORONAVIRUS 2 (TAT 6-24 HRS): SARS Coronavirus 2: NEGATIVE

## 2021-03-18 LAB — PSA: PSA: 1.12 ng/mL (ref <=4.00)

## 2021-03-18 NOTE — Telephone Encounter (Signed)
Noted  

## 2021-03-18 NOTE — Progress Notes (Signed)
Anesthesia Chart Review   Case: [id number] Date/Time: 03/22/21 1245   Procedure: TOTAL KNEE ARTHROPLASTY WITH REVISION COMPONENTS (POLY EXCHANGE); I&D (Right: Knee)   Anesthesia type: Choice   Pre-op diagnosis: INFECTED RIGHT TOTAL KNEE REPLACEMENT   Location: [facility] ROOM 08 / [facility]   Surgeons: Sheral Apley, MD       DISCUSSION:70 y.o. former smoker with h/o HTN, OSA, atrial fibrillation, pacemaker in place due to CHB (device orders in 03/17/21 progress note), infected right knee replacement scheduled for above procedure 03/22/2021 with Dr. Margarita Rana.   Per cardiology preoperative evaluation 03/16/21, "Chart reviewed as part of pre-operative protocol coverage. Given past medical history and time since last visit, based on ACC/AHA guidelines, MALEIK VANDERZEE would be at acceptable risk for the planned procedure without further cardiovascular testing.    Patient with diagnosis of afib on Eliquis for anticoagulation.     Procedure: requests states I&D but pt is scheduled for TKA as well in Epic Date of procedure: 03/22/21   CHA2DS2-VASc Score = 4  This indicates a 4.8% annual risk of stroke. The patient's score is based upon: CHF History: 1 HTN History: 1 Diabetes History: 0 Stroke History: 0 Vascular Disease History: 1 Age Score: 1 Gender Score: 0   CrCl 126mL/min Platelet count 183K   Per office protocol, patient can hold Eliquis for 3 days prior to procedure."  Anticipate pt can proceed with planned procedure barring acute status change.   VS: Ht 5\' 11"  (1.803 m)    Wt 129.3 kg    BMI 39.75 kg/m   PROVIDERS: , MD is PCP   Donita Brooks, MD is Cardiologist  LABS: Labs reviewed: Acceptable for surgery. (all labs ordered are listed, but only abnormal results are displayed)  Labs Reviewed - No data to display   IMAGES:   EKG: 03/01/2021 Rate 62  Atrial fibrillation  Nonspecific intraventricular conduction delay Low voltage, extremity  and precordial leads Lateral infarct, old ST elevation, consider inferior injury   CV: Echo 02/01/2018 Study Conclusions   - Left ventricle: Abnormal septal motion. The cavity size was    normal. Wall thickness was increased in a pattern of mild LVH.    Systolic function was normal. The estimated ejection fraction was    in the range of 55% to 60%. Wall motion was normal; there were no    regional wall motion abnormalities. Doppler parameters are    consistent with abnormal left ventricular relaxation (grade 1    diastolic dysfunction).  - Left atrium: The atrium was mildly dilated.  - Atrial septum: No defect or patent foramen ovale was identified.   Cardiac Cath 09/09/2015 Prox RCA lesion, 20% stenosed. Prox LAD to Mid LAD lesion, 40% stenosed. Mid Cx lesion, 20% stenosed. Ost 2nd Mrg to 2nd Mrg lesion, 20% stenosed. 1st Diag lesion, 30% stenosed. There is mild to moderate left ventricular systolic dysfunction.   1. Nonobstructive CAD 2. Mild to moderate LV dysfunction. EF 40-45%.   Plan: proceed with permanent pacemaker implant. Risk factor modification with statin therapy. Past Medical History:  Diagnosis Date   Arthritis    Atrial fibrillation (HCC)    Complete heart block (HCC)    Hypertension    OSA on CPAP    Presence of permanent cardiac pacemaker     Past Surgical History:  Procedure Laterality Date   APPENDECTOMY  1960s   CARDIAC CATHETERIZATION Right 09/08/2015   Procedure: Temporary Pacemaker;  Surgeon: Will 09/10/2015, MD;  Location: [facility];  Service: Cardiovascular;  Laterality: Right;   CARDIAC CATHETERIZATION N/A 09/09/2015   Procedure: Left Heart Cath and Coronary Angiography;  Surgeon: Peter M Swaziland, MD;  [facility]: [facility];  Service: Cardiovascular;  Laterality: N/A;   CARDIOVERSION N/A 01/28/2020   Procedure: CARDIOVERSION;  Surgeon: Jake Bathe, MD;  Location: [facility] ENDOSCOPY;  Service: Cardiovascular;  Laterality: N/A;    CHEILECTOMY Left 03/15/2018   Procedure: CHEILECTOMY LEFT FOOT;  Surgeon: Sheral Apley, MD;  Location: [facility];  Service: Orthopedics;  Laterality: Left;   COLONOSCOPY     EP IMPLANTABLE DEVICE N/A 09/09/2015   Procedure: BiV Pacemaker Insertion CRT-P;  Surgeon: Will Jorja Loa, MD;  Location: [facility];  Service: Cardiovascular;  Laterality: N/A;   KNEE SURGERY Right    TOTAL KNEE ARTHROPLASTY Right 07/11/2016   TOTAL KNEE ARTHROPLASTY Right 07/11/2016   Procedure: TOTAL KNEE ARTHROPLASTY;  Surgeon: Sheral Apley, MD;  Location[facility];  Service: Orthopedics;  Laterality: Right;    MEDICATIONS: No current facility-administered medications for this encounter.    acetaminophen (TYLENOL) 650 MG CR tablet   atorvastatin (LIPITOR) 40 MG tablet   carvedilol (COREG) 12.5 MG tablet   cholecalciferol (VITAMIN D) 25 MCG (1000 UNIT) tablet   ELIQUIS 5 MG TABS tablet   ibuprofen (ADVIL) 200 MG tablet   lisinopril (ZESTRIL) 40 MG tablet    [facility], PA-C [facility] Pre-Surgical Testing [phone]

## 2021-03-18 NOTE — Anesthesia Preprocedure Evaluation (Addendum)
Anesthesia Evaluation  Patient identified by MRN, date of birth, ID band Patient awake    Reviewed: Allergy & Precautions, NPO status , Patient's Chart, lab work & pertinent test results, reviewed documented beta blocker date and time   History of Anesthesia Complications Negative for: history of anesthetic complications  Airway Mallampati: II  TM Distance: >3 FB Neck ROM: Full    Dental  (+) Dental Advisory Given, Chipped   Pulmonary sleep apnea and Continuous Positive Airway Pressure Ventilation , former smoker,    Pulmonary exam normal        Cardiovascular hypertension, Pt. on medications and Pt. on home beta blockers + CAD  Normal cardiovascular exam+ dysrhythmias Atrial Fibrillation + pacemaker      Neuro/Psych negative neurological ROS  negative psych ROS   GI/Hepatic negative GI ROS, Neg liver ROS,   Endo/Other   Obesity   Renal/GU negative Renal ROS     Musculoskeletal  (+) Arthritis ,   Abdominal   Peds  Hematology  On eliquis, stopped >72 hr ago     Anesthesia Other Findings   Reproductive/Obstetrics                           Anesthesia Physical Anesthesia Plan  ASA: 3  Anesthesia Plan: Spinal   Post-op Pain Management: Regional block and Tylenol PO (pre-op)   Induction:   PONV Risk Score and Plan: 1 and Treatment may vary due to age or medical condition and Propofol infusion  Airway Management Planned: Natural Airway and Simple Face Mask  Additional Equipment: None  Intra-op Plan:   Post-operative Plan:   Informed Consent: I have reviewed the patients History and Physical, chart, labs and discussed the procedure including the risks, benefits and alternatives for the proposed anesthesia with the patient or authorized representative who has indicated his/her understanding and acceptance.       Plan Discussed with: CRNA and Anesthesiologist  Anesthesia Plan  Comments: (Labs reviewed, platelets acceptable. Discussed risks and benefits of spinal, including spinal/epidural hematoma, infection, failed block, and PDPH. Patient expressed understanding and wished to proceed. )      Anesthesia Quick Evaluation

## 2021-03-18 NOTE — Telephone Encounter (Signed)
Pt called in stating that the office was needing pt to get some additional lab work done. Pt stated that he has surgery scheduled for 12/27 and will call the office after that to get lab work done. Please advise.  Cb#: [phone]

## 2021-03-18 NOTE — Addendum Note (Signed)
Addended by: Grier Rocher on: 03/18/2021 10:14 AM   Modules accepted: Orders

## 2021-03-22 ENCOUNTER — Other Ambulatory Visit: Payer: Self-pay

## 2021-03-22 ENCOUNTER — Inpatient Hospital Stay: Payer: Self-pay

## 2021-03-22 ENCOUNTER — Encounter (HOSPITAL_COMMUNITY)
Admission: RE | Disposition: A | Discharge status: Home Health | Payer: Self-pay | Source: Home / Self Care | Attending: Orthopedic Surgery

## 2021-03-22 ENCOUNTER — Inpatient Hospital Stay (HOSPITAL_COMMUNITY): Payer: Medicare Other | Admitting: Physician Assistant

## 2021-03-22 ENCOUNTER — Encounter (HOSPITAL_COMMUNITY): Payer: Self-pay | Admitting: Orthopedic Surgery

## 2021-03-22 ENCOUNTER — Inpatient Hospital Stay (HOSPITAL_COMMUNITY): Payer: Medicare Other

## 2021-03-22 ENCOUNTER — Inpatient Hospital Stay (HOSPITAL_COMMUNITY)
Admission: RE | Admit: 2021-03-22 | Discharge: 2021-03-24 | DRG: 486 | Disposition: A | Discharge status: Home Health | Payer: Medicare Other | Attending: Orthopedic Surgery | Admitting: Orthopedic Surgery

## 2021-03-22 DIAGNOSIS — I10 Essential (primary) hypertension: Secondary | ICD-10-CM | POA: Diagnosis present

## 2021-03-22 DIAGNOSIS — E785 Hyperlipidemia, unspecified: Secondary | ICD-10-CM | POA: Diagnosis present

## 2021-03-22 DIAGNOSIS — I442 Atrioventricular block, complete: Secondary | ICD-10-CM | POA: Diagnosis present

## 2021-03-22 DIAGNOSIS — Z79899 Other long term (current) drug therapy: Secondary | ICD-10-CM | POA: Diagnosis not present

## 2021-03-22 DIAGNOSIS — Z800 Family history of malignant neoplasm of digestive organs: Secondary | ICD-10-CM | POA: Diagnosis not present

## 2021-03-22 DIAGNOSIS — R945 Abnormal results of liver function studies: Secondary | ICD-10-CM | POA: Diagnosis not present

## 2021-03-22 DIAGNOSIS — Z87891 Personal history of nicotine dependence: Secondary | ICD-10-CM | POA: Diagnosis not present

## 2021-03-22 DIAGNOSIS — M65161 Other infective (teno)synovitis, right knee: Secondary | ICD-10-CM | POA: Diagnosis present

## 2021-03-22 DIAGNOSIS — N35011 Post-traumatic bulbous urethral stricture: Secondary | ICD-10-CM | POA: Diagnosis present

## 2021-03-22 DIAGNOSIS — Y831 Surgical operation with implant of artificial internal device as the cause of abnormal reaction of the patient, or of later complication, without mention of misadventure at the time of the procedure: Secondary | ICD-10-CM | POA: Diagnosis present

## 2021-03-22 DIAGNOSIS — Z6839 Body mass index (BMI) 39.0-39.9, adult: Secondary | ICD-10-CM | POA: Diagnosis not present

## 2021-03-22 DIAGNOSIS — Z9989 Dependence on other enabling machines and devices: Secondary | ICD-10-CM | POA: Diagnosis not present

## 2021-03-22 DIAGNOSIS — Z8249 Family history of ischemic heart disease and other diseases of the circulatory system: Secondary | ICD-10-CM

## 2021-03-22 DIAGNOSIS — K8020 Calculus of gallbladder without cholecystitis without obstruction: Secondary | ICD-10-CM | POA: Diagnosis present

## 2021-03-22 DIAGNOSIS — B954 Other streptococcus as the cause of diseases classified elsewhere: Secondary | ICD-10-CM | POA: Diagnosis present

## 2021-03-22 DIAGNOSIS — Z01818 Encounter for other preprocedural examination: Secondary | ICD-10-CM

## 2021-03-22 DIAGNOSIS — Z7901 Long term (current) use of anticoagulants: Secondary | ICD-10-CM

## 2021-03-22 DIAGNOSIS — T8459XA Infection and inflammatory reaction due to other internal joint prosthesis, initial encounter: Secondary | ICD-10-CM | POA: Diagnosis not present

## 2021-03-22 DIAGNOSIS — Z96651 Presence of right artificial knee joint: Secondary | ICD-10-CM

## 2021-03-22 DIAGNOSIS — I4891 Unspecified atrial fibrillation: Secondary | ICD-10-CM | POA: Diagnosis present

## 2021-03-22 DIAGNOSIS — Z950 Presence of cardiac pacemaker: Secondary | ICD-10-CM

## 2021-03-22 DIAGNOSIS — G4733 Obstructive sleep apnea (adult) (pediatric): Secondary | ICD-10-CM | POA: Diagnosis present

## 2021-03-22 DIAGNOSIS — G8918 Other acute postprocedural pain: Secondary | ICD-10-CM | POA: Diagnosis not present

## 2021-03-22 DIAGNOSIS — M25461 Effusion, right knee: Secondary | ICD-10-CM | POA: Diagnosis not present

## 2021-03-22 DIAGNOSIS — T8453XA Infection and inflammatory reaction due to internal right knee prosthesis, initial encounter: Principal | ICD-10-CM

## 2021-03-22 DIAGNOSIS — R7989 Other specified abnormal findings of blood chemistry: Secondary | ICD-10-CM | POA: Diagnosis present

## 2021-03-22 DIAGNOSIS — E669 Obesity, unspecified: Secondary | ICD-10-CM | POA: Diagnosis present

## 2021-03-22 HISTORY — DX: Unspecified atrial fibrillation: I48.91

## 2021-03-22 HISTORY — PX: TOTAL KNEE ARTHROPLASTY WITH REVISION COMPONENTS: SHX6198

## 2021-03-22 HISTORY — PX: CYSTOSCOPY: SHX5120

## 2021-03-22 LAB — COMPREHENSIVE METABOLIC PANEL
ALT: 78 U/L — ABNORMAL HIGH (ref 0–44)
AST: 49 U/L — ABNORMAL HIGH (ref 15–41)
Albumin: 2.4 g/dL — ABNORMAL LOW (ref 3.5–5.0)
Alkaline Phosphatase: 66 U/L (ref 38–126)
Anion gap: 9 (ref 5–15)
BUN: 23 mg/dL (ref 8–23)
CO2: 25 mmol/L (ref 22–32)
Calcium: 8.6 mg/dL — ABNORMAL LOW (ref 8.9–10.3)
Chloride: 99 mmol/L (ref 98–111)
Creatinine, Ser: 1 mg/dL (ref 0.61–1.24)
GFR, Estimated: >60 mL/min (ref >60)
Glucose, Bld: 190 mg/dL — ABNORMAL HIGH (ref 70–99)
Potassium: 4.3 mmol/L (ref 3.5–5.1)
Sodium: 133 mmol/L — ABNORMAL LOW (ref 135–145)
Total Bilirubin: 1.1 mg/dL (ref 0.3–1.2)
Total Protein: 7.1 g/dL (ref 6.5–8.1)

## 2021-03-22 LAB — CBC
HCT: 39.6 % (ref 39.0–52.0)
Hemoglobin: 13 g/dL (ref 13.0–17.0)
MCH: 29.3 pg (ref 26.0–34.0)
MCHC: 32.8 g/dL (ref 30.0–36.0)
MCV: 89.4 fL (ref 80.0–100.0)
Platelets: 211 10*3/uL (ref 150–400)
RBC: 4.43 MIL/uL (ref 4.22–5.81)
RDW: 12.3 % (ref 11.5–15.5)
WBC: 9.3 10*3/uL (ref 4.0–10.5)
nRBC: 0 % (ref 0.0–0.2)

## 2021-03-22 LAB — TYPE AND SCREEN
ABO/RH(D): O POS
Antibody Screen: NEGATIVE

## 2021-03-22 LAB — SURGICAL PCR SCREEN
MRSA, PCR: NEGATIVE
Staphylococcus aureus: NEGATIVE

## 2021-03-22 LAB — SEDIMENTATION RATE: Sed Rate: 74 mm/hr — ABNORMAL HIGH (ref 0–16)

## 2021-03-22 LAB — ABO/RH: ABO/RH(D): O POS

## 2021-03-22 SURGERY — TOTAL KNEE ARTHROPLASTY WITH REVISION COMPONENTS
Anesthesia: Spinal | Site: Urethra | Laterality: Right

## 2021-03-22 MED ORDER — BUPIVACAINE LIPOSOME 1.3 % IJ SUSP
INTRAMUSCULAR | Status: AC
  Filled 2021-03-22: qty 20

## 2021-03-22 MED ORDER — VANCOMYCIN HCL 1000 MG IV SOLR
INTRAVENOUS | Status: AC
  Filled 2021-03-22: qty 20

## 2021-03-22 MED ORDER — SODIUM CHLORIDE 0.9% FLUSH: 10.0000 mL | INTRAVENOUS | Status: DC | PRN

## 2021-03-22 MED ORDER — ORAL CARE MOUTH RINSE: 15.0000 mL | Freq: Once | OROMUCOSAL | Status: AC

## 2021-03-22 MED ORDER — LISINOPRIL 20 MG PO TABS
40.0000 mg | ORAL_TABLET | Freq: Every day | ORAL | Status: DC
  Administered 2021-03-23 – 2021-03-24 (×2): 40 mg via ORAL
  Filled 2021-03-22 (×2): qty 2

## 2021-03-22 MED ORDER — TRANEXAMIC ACID-NACL 1000-0.7 MG/100ML-% IV SOLN
INTRAVENOUS | Status: AC
  Filled 2021-03-22: qty 100

## 2021-03-22 MED ORDER — ONDANSETRON HCL 4 MG/2ML IJ SOLN: 4.0000 mg | Freq: Once | INTRAMUSCULAR | Status: DC | PRN

## 2021-03-22 MED ORDER — PHENYLEPHRINE HCL (PRESSORS) 10 MG/ML IV SOLN
INTRAVENOUS | Status: AC
  Filled 2021-03-22: qty 1

## 2021-03-22 MED ORDER — ACETAMINOPHEN 325 MG PO TABS: 325.0000 mg | ORAL_TABLET | Freq: Four times a day (QID) | ORAL | Status: DC | PRN

## 2021-03-22 MED ORDER — PROPOFOL 500 MG/50ML IV EMUL
INTRAVENOUS | Status: DC | PRN
  Administered 2021-03-22: 50 ug/kg/min via INTRAVENOUS

## 2021-03-22 MED ORDER — ACETAMINOPHEN 500 MG PO TABS
1000.0000 mg | ORAL_TABLET | Freq: Four times a day (QID) | ORAL | Status: AC
  Administered 2021-03-22 – 2021-03-23 (×4): 1000 mg via ORAL
  Filled 2021-03-22 (×4): qty 2

## 2021-03-22 MED ORDER — METHOCARBAMOL 500 MG PO TABS
500.0000 mg | ORAL_TABLET | Freq: Four times a day (QID) | ORAL | Status: DC | PRN
  Administered 2021-03-23: 10:00:00 500 mg via ORAL
  Filled 2021-03-22: qty 1

## 2021-03-22 MED ORDER — BUPIVACAINE-EPINEPHRINE (PF) 0.25% -1:200000 IJ SOLN
INTRAMUSCULAR | Status: AC
  Filled 2021-03-22: qty 30

## 2021-03-22 MED ORDER — SODIUM CHLORIDE 0.9 % IV SOLN
2.0000 g | INTRAVENOUS | Status: DC
  Administered 2021-03-22 – 2021-03-24 (×3): 2 g via INTRAVENOUS
  Filled 2021-03-22 (×3): qty 20

## 2021-03-22 MED ORDER — POVIDONE-IODINE 10 % EX SWAB: 2.0000 "application " | Freq: Once | CUTANEOUS | Status: DC

## 2021-03-22 MED ORDER — CEFAZOLIN IN SODIUM CHLORIDE 3-0.9 GM/100ML-% IV SOLN
3.0000 g | INTRAVENOUS | Status: AC
  Administered 2021-03-22: 13:00:00 3 g via INTRAVENOUS
  Filled 2021-03-22: qty 100

## 2021-03-22 MED ORDER — ONDANSETRON HCL 4 MG/2ML IJ SOLN
INTRAMUSCULAR | Status: AC
  Filled 2021-03-22: qty 2

## 2021-03-22 MED ORDER — ATORVASTATIN CALCIUM 40 MG PO TABS
40.0000 mg | ORAL_TABLET | Freq: Every day | ORAL | Status: DC
  Administered 2021-03-22 – 2021-03-23 (×2): 40 mg via ORAL
  Filled 2021-03-22 (×2): qty 1

## 2021-03-22 MED ORDER — ROPIVACAINE HCL 7.5 MG/ML IJ SOLN
INTRAMUSCULAR | Status: DC | PRN
  Administered 2021-03-22: 20 mL via PERINEURAL

## 2021-03-22 MED ORDER — IRRISEPT - 450ML BOTTLE WITH 0.05% CHG IN STERILE WATER, USP 99.95% OPTIME
TOPICAL | Status: DC | PRN
  Administered 2021-03-22: 14:00:00 450 mL

## 2021-03-22 MED ORDER — 0.9 % SODIUM CHLORIDE (POUR BTL) OPTIME
TOPICAL | Status: DC | PRN
  Administered 2021-03-22: 14:00:00 1000 mL

## 2021-03-22 MED ORDER — METOCLOPRAMIDE HCL 5 MG/ML IJ SOLN: 5.0000 mg | Freq: Three times a day (TID) | INTRAMUSCULAR | Status: DC | PRN

## 2021-03-22 MED ORDER — PHENYLEPHRINE HCL-NACL 20-0.9 MG/250ML-% IV SOLN
INTRAVENOUS | Status: DC | PRN
  Administered 2021-03-22: 40 ug/min via INTRAVENOUS

## 2021-03-22 MED ORDER — OXYCODONE HCL 5 MG PO TABS
10.0000 mg | ORAL_TABLET | ORAL | Status: DC | PRN
  Filled 2021-03-22: qty 2

## 2021-03-22 MED ORDER — BUPIVACAINE IN DEXTROSE 0.75-8.25 % IT SOLN
INTRATHECAL | Status: DC | PRN
  Administered 2021-03-22: 1.6 mL via INTRATHECAL

## 2021-03-22 MED ORDER — SODIUM CHLORIDE 0.9 % IR SOLN
Status: DC | PRN
  Administered 2021-03-22: 1000 mL

## 2021-03-22 MED ORDER — TRANEXAMIC ACID-NACL 1000-0.7 MG/100ML-% IV SOLN
INTRAVENOUS | Status: DC | PRN
  Administered 2021-03-22: 1000 mg via INTRAVENOUS

## 2021-03-22 MED ORDER — BISACODYL 10 MG RE SUPP: 10.0000 mg | Freq: Every day | RECTAL | Status: DC | PRN

## 2021-03-22 MED ORDER — DIPHENHYDRAMINE HCL 12.5 MG/5ML PO ELIX: 12.5000 mg | ORAL_SOLUTION | ORAL | Status: DC | PRN

## 2021-03-22 MED ORDER — FENTANYL CITRATE PF 50 MCG/ML IJ SOSY
50.0000 ug | PREFILLED_SYRINGE | INTRAMUSCULAR | Status: DC
  Administered 2021-03-22: 12:00:00 50 ug via INTRAVENOUS
  Filled 2021-03-22: qty 2

## 2021-03-22 MED ORDER — CARVEDILOL 12.5 MG PO TABS
12.5000 mg | ORAL_TABLET | Freq: Two times a day (BID) | ORAL | Status: DC
  Administered 2021-03-22 – 2021-03-24 (×4): 12.5 mg via ORAL
  Filled 2021-03-22 (×4): qty 1

## 2021-03-22 MED ORDER — TRAMADOL HCL 50 MG PO TABS
50.0000 mg | ORAL_TABLET | Freq: Four times a day (QID) | ORAL | Status: DC
  Administered 2021-03-22 – 2021-03-24 (×8): 50 mg via ORAL
  Filled 2021-03-22 (×8): qty 1

## 2021-03-22 MED ORDER — ONDANSETRON HCL 4 MG PO TABS: 4.0000 mg | ORAL_TABLET | Freq: Four times a day (QID) | ORAL | Status: DC | PRN

## 2021-03-22 MED ORDER — FENTANYL CITRATE PF 50 MCG/ML IJ SOSY: 25.0000 ug | PREFILLED_SYRINGE | INTRAMUSCULAR | Status: DC | PRN

## 2021-03-22 MED ORDER — LACTATED RINGERS IV SOLN: INTRAVENOUS | Status: DC

## 2021-03-22 MED ORDER — OXYCODONE HCL 5 MG/5ML PO SOLN: 5.0000 mg | Freq: Once | ORAL | Status: DC | PRN

## 2021-03-22 MED ORDER — WATER FOR IRRIGATION, STERILE IR SOLN
Status: DC | PRN
  Administered 2021-03-22: 2000 mL

## 2021-03-22 MED ORDER — CARVEDILOL 12.5 MG PO TABS
12.5000 mg | ORAL_TABLET | Freq: Once | ORAL | Status: AC
  Administered 2021-03-22: 12:00:00 12.5 mg via ORAL
  Filled 2021-03-22: qty 1

## 2021-03-22 MED ORDER — SODIUM CHLORIDE 0.9% FLUSH: 10.0000 mL | Freq: Two times a day (BID) | INTRAVENOUS | Status: DC

## 2021-03-22 MED ORDER — CHLORHEXIDINE GLUCONATE CLOTH 2 % EX PADS: 6.0000 | MEDICATED_PAD | Freq: Every day | CUTANEOUS | Status: DC

## 2021-03-22 MED ORDER — DEXAMETHASONE SODIUM PHOSPHATE 10 MG/ML IJ SOLN
INTRAMUSCULAR | Status: AC
  Filled 2021-03-22: qty 1

## 2021-03-22 MED ORDER — DEXAMETHASONE SODIUM PHOSPHATE 10 MG/ML IJ SOLN
8.0000 mg | Freq: Once | INTRAMUSCULAR | Status: AC
  Administered 2021-03-22: 13:00:00 8 mg via INTRAVENOUS

## 2021-03-22 MED ORDER — MIDAZOLAM HCL 2 MG/2ML IJ SOLN
1.0000 mg | INTRAMUSCULAR | Status: DC
  Filled 2021-03-22: qty 2

## 2021-03-22 MED ORDER — PROPOFOL 1000 MG/100ML IV EMUL
INTRAVENOUS | Status: AC
  Filled 2021-03-22: qty 100

## 2021-03-22 MED ORDER — ONDANSETRON HCL 4 MG/2ML IJ SOLN
INTRAMUSCULAR | Status: DC | PRN
  Administered 2021-03-22: 4 mg via INTRAVENOUS

## 2021-03-22 MED ORDER — METHOCARBAMOL 1000 MG/10ML IJ SOLN
500.0000 mg | Freq: Four times a day (QID) | INTRAVENOUS | Status: DC | PRN
  Filled 2021-03-22: qty 5

## 2021-03-22 MED ORDER — POVIDONE-IODINE 7.5 % EX SOLN: Freq: Once | CUTANEOUS | Status: AC

## 2021-03-22 MED ORDER — CHLORHEXIDINE GLUCONATE 0.12 % MT SOLN
15.0000 mL | Freq: Once | OROMUCOSAL | Status: AC
  Administered 2021-03-22: 11:00:00 15 mL via OROMUCOSAL

## 2021-03-22 MED ORDER — PROPOFOL 10 MG/ML IV BOLUS
INTRAVENOUS | Status: DC | PRN
  Administered 2021-03-22: 20 mg via INTRAVENOUS
  Administered 2021-03-22: 10 mg via INTRAVENOUS
  Administered 2021-03-22: 20 mg via INTRAVENOUS

## 2021-03-22 MED ORDER — OXYCODONE HCL 5 MG PO TABS: 5.0000 mg | ORAL_TABLET | Freq: Once | ORAL | Status: DC | PRN

## 2021-03-22 MED ORDER — HYDROMORPHONE HCL 1 MG/ML IJ SOLN: 0.5000 mg | INTRAMUSCULAR | Status: DC | PRN

## 2021-03-22 MED ORDER — POLYETHYLENE GLYCOL 3350 17 G PO PACK: 17.0000 g | PACK | Freq: Every day | ORAL | Status: DC | PRN

## 2021-03-22 MED ORDER — ACETAMINOPHEN 500 MG PO TABS
1000.0000 mg | ORAL_TABLET | Freq: Once | ORAL | Status: AC
  Administered 2021-03-22: 11:00:00 1000 mg via ORAL
  Filled 2021-03-22: qty 2

## 2021-03-22 MED ORDER — ONDANSETRON HCL 4 MG/2ML IJ SOLN: 4.0000 mg | Freq: Four times a day (QID) | INTRAMUSCULAR | Status: DC | PRN

## 2021-03-22 MED ORDER — PHENYLEPHRINE 40 MCG/ML (10ML) SYRINGE FOR IV PUSH (FOR BLOOD PRESSURE SUPPORT)
PREFILLED_SYRINGE | INTRAVENOUS | Status: AC
  Filled 2021-03-22: qty 10

## 2021-03-22 MED ORDER — APIXABAN 5 MG PO TABS
5.0000 mg | ORAL_TABLET | Freq: Two times a day (BID) | ORAL | Status: DC
  Administered 2021-03-23 – 2021-03-24 (×3): 5 mg via ORAL
  Filled 2021-03-22 (×3): qty 1

## 2021-03-22 MED ORDER — DOCUSATE SODIUM 100 MG PO CAPS
100.0000 mg | ORAL_CAPSULE | Freq: Two times a day (BID) | ORAL | Status: DC
  Administered 2021-03-22 – 2021-03-24 (×4): 100 mg via ORAL
  Filled 2021-03-22 (×4): qty 1

## 2021-03-22 MED ORDER — METOCLOPRAMIDE HCL 5 MG PO TABS: 5.0000 mg | ORAL_TABLET | Freq: Three times a day (TID) | ORAL | Status: DC | PRN

## 2021-03-22 MED ORDER — VANCOMYCIN HCL 1000 MG IV SOLR
INTRAVENOUS | Status: DC | PRN
  Administered 2021-03-22: 1000 mg via TOPICAL

## 2021-03-22 MED ORDER — OXYCODONE HCL 5 MG PO TABS
5.0000 mg | ORAL_TABLET | ORAL | Status: DC | PRN
  Administered 2021-03-22 – 2021-03-24 (×4): 10 mg via ORAL
  Filled 2021-03-22 (×3): qty 2

## 2021-03-22 SURGICAL SUPPLY — 53 items
BAG COUNTER SPONGE SURGICOUNT (BAG) IMPLANT
BLADE HEX COATED 2.75 (ELECTRODE) ×3 IMPLANT
BLADE SAG 18X100X1.27 (BLADE) ×3 IMPLANT
BLADE SAGITTAL 25.0X1.37X90 (BLADE) ×3 IMPLANT
BLADE SURG 15 STRL LF DISP TIS (BLADE) ×2 IMPLANT
BLADE SURG 15 STRL SS (BLADE) ×3
BLADE SURG SZ10 CARB STEEL (BLADE) ×6 IMPLANT
BNDG ELASTIC 6X10 VLCR STRL LF (GAUZE/BANDAGES/DRESSINGS) ×3 IMPLANT
BOWL SMART MIX CTS (DISPOSABLE) IMPLANT
CATH TIEMANN FOLEY 18FR 5CC (CATHETERS) ×1 IMPLANT
CLSR STERI-STRIP ANTIMIC 1/2X4 (GAUZE/BANDAGES/DRESSINGS) ×3 IMPLANT
COVER SURGICAL LIGHT HANDLE (MISCELLANEOUS) ×3 IMPLANT
CUFF TOURN SGL QUICK 34 (TOURNIQUET CUFF) ×3
CUFF TRNQT CYL 34X4.125X (TOURNIQUET CUFF) ×2 IMPLANT
DECANTER SPIKE VIAL GLASS SM (MISCELLANEOUS) ×3 IMPLANT
DERMABOND ADVANCED (GAUZE/BANDAGES/DRESSINGS) ×1
DERMABOND ADVANCED .7 DNX12 (GAUZE/BANDAGES/DRESSINGS) IMPLANT
DRAPE EXTREMITY T 121X128X90 (DISPOSABLE) ×1 IMPLANT
DRAPE INCISE IOBAN 66X45 STRL (DRAPES) ×9 IMPLANT
DRAPE U-SHAPE 47X51 STRL (DRAPES) ×3 IMPLANT
DRSG MEPILEX BORDER 4X12 (GAUZE/BANDAGES/DRESSINGS) ×3 IMPLANT
DURAPREP 26ML APPLICATOR (WOUND CARE) ×6 IMPLANT
GLOVE SRG 8 PF TXTR STRL LF DI (GLOVE) ×2 IMPLANT
GLOVE SURG ENC MOIS LTX SZ7.5 (GLOVE) ×3 IMPLANT
GLOVE SURG POLYISO LF SZ7.5 (GLOVE) ×3 IMPLANT
GLOVE SURG UNDER POLY LF SZ7.5 (GLOVE) ×3 IMPLANT
GLOVE SURG UNDER POLY LF SZ8 (GLOVE) ×3
GOWN STRL REUS W/TWL LRG LVL3 (GOWN DISPOSABLE) ×3 IMPLANT
GOWN STRL REUS W/TWL XL LVL3 (GOWN DISPOSABLE) ×3 IMPLANT
GUIDEWIRE STR DUAL SENSOR (WIRE) ×1 IMPLANT
HANDPIECE INTERPULSE COAX TIP (DISPOSABLE) ×3
HOLDER FOLEY CATH W/STRAP (MISCELLANEOUS) IMPLANT
IMMOBILIZER KNEE 22 UNIV (SOFTGOODS) ×3 IMPLANT
INSERT TIB BEARING SZ6 9 (Insert) ×1 IMPLANT
JET LAVAGE IRRISEPT WOUND (IRRIGATION / IRRIGATOR) ×3
LAVAGE JET IRRISEPT WOUND (IRRIGATION / IRRIGATOR) IMPLANT
MANIFOLD NEPTUNE II (INSTRUMENTS) ×3 IMPLANT
NS IRRIG 1000ML POUR BTL (IV SOLUTION) ×3 IMPLANT
PACK TOTAL KNEE CUSTOM (KITS) ×3 IMPLANT
PROTECTOR NERVE ULNAR (MISCELLANEOUS) ×3 IMPLANT
SET HNDPC FAN SPRY TIP SCT (DISPOSABLE) ×2 IMPLANT
SET IRRIG Y TYPE TUR BLADDER L (SET/KITS/TRAYS/PACK) ×1 IMPLANT
SPONGE T-LAP 18X18 ~~LOC~~+RFID (SPONGE) ×9 IMPLANT
STOCKINETTE 8 INCH (MISCELLANEOUS) ×1 IMPLANT
SUT MNCRL AB 3-0 PS2 18 (SUTURE) ×3 IMPLANT
SUT VIC AB 0 CT1 36 (SUTURE) ×3 IMPLANT
SUT VIC AB 1 CT1 36 (SUTURE) ×6 IMPLANT
SUT VIC AB 2-0 CT1 27 (SUTURE) ×3
SUT VIC AB 2-0 CT1 TAPERPNT 27 (SUTURE) ×2 IMPLANT
TRAY FOLEY MTR SLVR 14FR STAT (SET/KITS/TRAYS/PACK) IMPLANT
TRAY FOLEY MTR SLVR 16FR STAT (SET/KITS/TRAYS/PACK) IMPLANT
TUBE SUCTION HIGH CAP CLEAR NV (SUCTIONS) ×3 IMPLANT
WRAP KNEE MAXI GEL POST OP (GAUZE/BANDAGES/DRESSINGS) ×3 IMPLANT

## 2021-03-22 NOTE — Op Note (Signed)
Operative Note  Preoperative diagnosis:  1.  Difficulty foley insertion  Postoperative diagnosis: 1.  Traumatic foley/false passage  Procedure(s): 1.  Diagnostic urethroscopy with unsuccessful foley placement  Surgeon: Jacalyn Lefevre, MD  Assistants:  None  Anesthesia:  General  Complications:  None  EBL:  none  Specimens: 1. none  Drains/Catheters: 1.  none  Intraoperative findings:   Obliterated lumen of bulbar urethra - no true lumen seen Attempts at cannulating lumen with sensor wire through cystoscope were unsuccessful  Indication:  Harold Robertson is a 70 y.o. male with infected right total knee in OR for washout.  Attempts at placing foley catheter were unsuccessful by OR nurse.  Urology consulted for placement.   Description of procedure:  The patient was already under anesthesia with epidural when Urology was called.  I made one attempt with 18Fr coude foley and met resistance in bulbar urethra.  Next, a sterile draped placed.  A 17Fr flexible cystoscope was placed in the urethral meatus and advanced to the bulbar urethra.  At this point in time no true lumen was seen.  A 0.38 sensor wire was gently advanced and attempt to cannulate any small opening which was unsuccessful.  The decision was made to remove the cystoscope.  The patient's care was turned back over to orthopedic surgery.  He will attempt to void after surgery and if unable of and urology will be consulted for possible suprapubic tube.

## 2021-03-22 NOTE — Progress Notes (Signed)
Peripherally Inserted Central Catheter Placement  The IV Nurse has discussed with the patient and/or persons authorized to consent for the patient, the purpose of this procedure and the potential benefits and risks involved with this procedure.  The benefits include less needle sticks, lab draws from the catheter, and the patient may be discharged home with the catheter. Risks include, but not limited to, infection, bleeding, blood clot (thrombus formation), and puncture of an artery; nerve damage and irregular heartbeat and possibility to perform a PICC exchange if needed/ordered by physician.  Alternatives to this procedure were also discussed.  Bard Power PICC patient education guide, fact sheet on infection prevention and patient information card has been provided to patient /or left at bedside.    PICC Placement Documentation  PICC Single Lumen 03/22/21 Right Brachial 46 cm 1 cm (Active)  Indication for Insertion or Continuance of Line Home intravenous therapies (PICC only) 03/22/21 1019  Exposed Catheter (cm) 0 cm 03/22/21 1019  Site Assessment Intact;Dry;Clean 03/22/21 1019  Line Status Flushed;Blood return noted;Saline locked 03/22/21 1019  Dressing Type Transparent 03/22/21 1019  Dressing Status Clean;Dry;Intact 03/22/21 1019  Antimicrobial disc in place? Yes 03/22/21 1019  Dressing Change Due 03/29/21 03/22/21 1019       Inice Sanluis Ramos 03/22/2021, 10:21 AM

## 2021-03-22 NOTE — Op Note (Signed)
03/22/2021  12:46 PM  PATIENT:  Harold Robertson    PRE-OPERATIVE DIAGNOSIS:  INFECTED RIGHT TOTAL KNEE REPLACEMENT  POST-OPERATIVE DIAGNOSIS:  Same  PROCEDURE:  TOTAL KNEE ARTHROPLASTY WITH REVISION COMPONENTS (POLY EXCHANGE); I&D  SURGEON:  Sheral Apley, MD  ASSISTANT: Levester Fresh, PA-C, he was present and scrubbed throughout the case, critical for completion in a timely fashion, and for retraction, instrumentation, and closure.   ANESTHESIA:   spinal  PREOPERATIVE INDICATIONS:  Harold Robertson is a  70 y.o. male with a diagnosis of INFECTED RIGHT TOTAL KNEE REPLACEMENT who failed conservative measures and elected for surgical management.    The risks benefits and alternatives were discussed with the patient preoperatively including but not limited to the risks of infection, bleeding, nerve injury, cardiopulmonary complications, the need for revision surgery, among others, and the patient was willing to proceed.  OPERATIVE IMPLANTS: stryker polly size 9  OPERATIVE FINDINGS: synovitis  BLOOD LOSS: min  COMPLICATIONS: none  TOURNIQUET TIME:  OPERATIVE PROCEDURE:  Patient was identified in the preoperative holding area and site was marked by me He was transported to the operating theater and placed on the table in supine position taking care to pad all bony prominences. After a preincinduction time out anesthesia was induced. The right lower extremity was prepped and draped in normal sterile fashion and a pre-incision timeout was performed. He received ancef for preoperative antibiotics.   Tourniquet was inflated  I made an anterior incision through his previous incision and dissected to his extensor mechanism and capsule I made a medial parapatellar arthrotomy  I exposed the total knee and then remove the polyethylene component of the tibia  Next I performed a thorough synovectomy with multiple irrigations using Aricept and saline  Once I was satisfied with  the synovectomy repeat irrigation was performed it was washed again with saline either with Aricept I then trialed with the above-mentioned poly and was happy with the fit we opened and inserted the implantable polyethylene component thorough irrigation was again performed capsular incision was closed followed by the skin closure.    POST OPERATIVE PLAN: Weightbearing as tolerated knee immobilizer for 2 days mobilize and chemical DVT prophylaxis IV antibiotics  There was difficulty with his foley insertion so I elected to go without after a curbside discussion with Urology. If he is unable to void in pacu we will obtain formal consult

## 2021-03-22 NOTE — Anesthesia Procedure Notes (Signed)
Spinal  Patient location during procedure: OR Start time: 03/22/2021 12:50 PM End time: 03/22/2021 12:57 PM Reason for block: surgical anesthesia Staffing Performed: anesthesiologist  Anesthesiologist: Beryle Lathe, MD Preanesthetic Checklist Completed: patient identified, IV checked, risks and benefits discussed, surgical consent, monitors and equipment checked, pre-op evaluation and timeout performed Spinal Block Patient position: sitting Prep: DuraPrep Patient monitoring: heart rate, cardiac monitor, continuous pulse ox and blood pressure Approach: midline Location: L3-4 Injection technique: single-shot Needle Needle type: Quincke  Needle gauge: 22 G Needle length: 9 cm Assessment Events: second provider Additional Notes Consent was obtained prior to the procedure with all questions answered and concerns addressed. Risks including, but not limited to, bleeding, infection, nerve damage, paralysis, failed block, inadequate analgesia, allergic reaction, high spinal, itching, and headache were discussed and the patient wished to proceed. Functioning IV was confirmed and monitors were applied. Sterile prep and drape, including hand hygiene, mask, and sterile gloves were used. The patient was positioned and the spine was prepped. The skin was anesthetized with lidocaine. After unsuccessful attempt by CRNA, Dr. Mal Amabile able to locate intrathecal space with longer quincke needle. Free flow of clear CSF was obtained prior to injecting local anesthetic into the CSF. The spinal needle aspirated freely following injection. The needle was carefully withdrawn. The patient tolerated the procedure well.   Leslye Peer, MD

## 2021-03-22 NOTE — Anesthesia Postprocedure Evaluation (Signed)
Anesthesia Post Note  Patient: Harold Robertson  Procedure(s) Performed: TOTAL KNEE ARTHROPLASTY WITH REVISION COMPONENTS (POLY EXCHANGE); I&D (Right: Knee) CYSTOSCOPY FLEXIBLE; ATTEMPTED FOLEY INSERTION (Urethra)     Patient location during evaluation: PACU Anesthesia Type: Spinal Level of consciousness: awake and alert Pain management: pain level controlled Vital Signs Assessment: post-procedure vital signs reviewed and stable Respiratory status: spontaneous breathing and respiratory function stable Cardiovascular status: blood pressure returned to baseline and stable Postop Assessment: spinal receding and no apparent nausea or vomiting Anesthetic complications: no   No notable events documented.  Last Vitals:  Vitals:   03/22/21 1600 03/22/21 1615  BP: 113/68 135/85  Pulse: (!) 59 (!) 59  Resp: 14 17  Temp:    SpO2: 95% 96%    Last Pain:  Vitals:   03/22/21 1615  TempSrc:   PainSc: 0-No pain                 Beryle Lathe

## 2021-03-22 NOTE — Transfer of Care (Signed)
Immediate Anesthesia Transfer of Care Note  Patient: Harold Robertson  Procedure(s) Performed: TOTAL KNEE ARTHROPLASTY WITH REVISION COMPONENTS (POLY EXCHANGE); I&D (Right: Knee) CYSTOSCOPY FLEXIBLE; ATTEMPTED FOLEY INSERTION (Urethra)  Patient Location: PACU  Anesthesia Type:Spinal  Level of Consciousness: awake  Airway & Oxygen Therapy: Patient Spontanous Breathing and Patient connected to face mask oxygen  Post-op Assessment: Report given to RN and Post -op Vital signs reviewed and stable  Post vital signs: Reviewed and stable  Last Vitals:  Vitals Value Taken Time  BP 111/72 03/22/21 1521  Temp    Pulse 58 03/22/21 1523  Resp 16 03/22/21 1523  SpO2 100 % 03/22/21 1523  Vitals shown include unvalidated device data.  Last Pain:  Vitals:   03/22/21 1218  TempSrc:   PainSc: 0-No pain      Patients Stated Pain Goal: 3 (03/22/21 1148)  Complications: No notable events documented.

## 2021-03-22 NOTE — Anesthesia Procedure Notes (Signed)
Anesthesia Regional Block: Adductor canal block   Pre-Anesthetic Checklist: , timeout performed,  Correct Patient, Correct Site, Correct Laterality,  Correct Procedure, Correct Position, site marked,  Risks and benefits discussed,  Surgical consent,  Pre-op evaluation,  At surgeon's request and post-op pain management  Laterality: Right  Prep: chloraprep       Needles:  Injection technique: Single-shot  Needle Type: Echogenic Needle     Needle Length: 10cm  Needle Gauge: 21     Additional Needles:   Narrative:  Start time: 03/22/2021 11:48 AM End time: 03/22/2021 11:51 AM Injection made incrementally with aspirations every 5 mL.  Performed by: Personally  Anesthesiologist: Beryle Lathe, MD  Additional Notes: No pain on injection. No increased resistance to injection. Injection made in 5cc increments. Good needle visualization. Patient tolerated the procedure well.

## 2021-03-22 NOTE — Progress Notes (Signed)
Assisted Dr. Brock with right, ultrasound guided, adductor canal block. Side rails up, monitors on throughout procedure. See vital signs in flow sheet. Tolerated Procedure well.  

## 2021-03-22 NOTE — Interval H&P Note (Signed)
History and Physical Interval Note:  03/22/2021 11:38 AM  Harold Robertson  has presented today for surgery, with the diagnosis of INFECTED RIGHT TOTAL KNEE REPLACEMENT.  The various methods of treatment have been discussed with the patient and family. After consideration of risks, benefits and other options for treatment, the patient has consented to  Procedure(s): TOTAL KNEE ARTHROPLASTY WITH REVISION COMPONENTS (POLY EXCHANGE); I&D (Right) as a surgical intervention.  The patient's history has been reviewed, patient examined, no change in status, stable for surgery.  I have reviewed the patient's chart and labs.  Questions were answered to the patient's satisfaction.     Sheral Apley

## 2021-03-22 NOTE — OR Nursing (Signed)
Difficult foley insertion noted.  Dr Gaspar Skeeters called to assist.  Flexible cystoscopy performed.  Foley insertion unsuccessful.

## 2021-03-22 NOTE — Plan of Care (Signed)
  Problem: Activity: Goal: Risk for activity intolerance will decrease Outcome: Progressing   Problem: Pain Managment: Goal: General experience of comfort will improve Outcome: Progressing   Problem: Safety: Goal: Ability to remain free from injury will improve Outcome: Progressing   

## 2021-03-22 NOTE — Progress Notes (Signed)
° ° °  Lab results from specimen of joint fluid obtained in the office via joint aspiration on 03/14/21 show:  + Alpha defensin PJI, + Alpha defensins SF, + Neutrophil elastase, CRP >60, RBCs 40,000, WBCs 55,331, 97.8% Neutrophils, and no crystals.    P. Acnes = negative Staph = positive Candida = indeterminate Enterococcus = negative    Organism: Streptococcus agalactiae    Sensitivities Ampicillin                  S    <=0.25 Benzylpenicillin        S    <=0.06 Ceftriaxone              S    <=0.12 Clindamycin             S    <=0.25 Levofloxacin            S     0.5 Linezolid                  S     <=2 Moxifloxacin            S     0.12 Tigecycline              S    <=0.06 Vancomycin            S     0.5    Marzetta Board Office [phone] 03/22/2021, 12:36 PM

## 2021-03-22 NOTE — Interval H&P Note (Signed)
History and Physical Interval Note:  03/22/2021 10:07 AM  Harold Robertson  has presented today for surgery, with the diagnosis of INFECTED RIGHT TOTAL KNEE REPLACEMENT.  The various methods of treatment have been discussed with the patient and family. After consideration of risks, benefits and other options for treatment, the patient has consented to  Procedure(s): TOTAL KNEE ARTHROPLASTY WITH REVISION COMPONENTS (POLY EXCHANGE); I&D (Right) as a surgical intervention.  The patient's history has been reviewed, patient examined, no change in status, stable for surgery.  I have reviewed the patient's chart and labs.  Questions were answered to the patient's satisfaction.     Sheral Apley

## 2021-03-22 NOTE — Discharge Instructions (Addendum)

## 2021-03-23 ENCOUNTER — Inpatient Hospital Stay (HOSPITAL_COMMUNITY): Payer: Medicare Other

## 2021-03-23 ENCOUNTER — Encounter (HOSPITAL_COMMUNITY): Payer: Self-pay | Admitting: Orthopedic Surgery

## 2021-03-23 DIAGNOSIS — T8453XA Infection and inflammatory reaction due to internal right knee prosthesis, initial encounter: Principal | ICD-10-CM

## 2021-03-23 DIAGNOSIS — R7989 Other specified abnormal findings of blood chemistry: Secondary | ICD-10-CM

## 2021-03-23 DIAGNOSIS — Z96651 Presence of right artificial knee joint: Secondary | ICD-10-CM

## 2021-03-23 LAB — COMPREHENSIVE METABOLIC PANEL
ALT: 62 U/L — ABNORMAL HIGH (ref 0–44)
AST: 35 U/L (ref 15–41)
Albumin: 2.4 g/dL — ABNORMAL LOW (ref 3.5–5.0)
Alkaline Phosphatase: 65 U/L (ref 38–126)
Anion gap: 5 (ref 5–15)
BUN: 15 mg/dL (ref 8–23)
CO2: 28 mmol/L (ref 22–32)
Calcium: 8.6 mg/dL — ABNORMAL LOW (ref 8.9–10.3)
Chloride: 99 mmol/L (ref 98–111)
Creatinine, Ser: 0.67 mg/dL (ref 0.61–1.24)
GFR, Estimated: >60 mL/min (ref >60)
Glucose, Bld: 166 mg/dL — ABNORMAL HIGH (ref 70–99)
Potassium: 4.5 mmol/L (ref 3.5–5.1)
Sodium: 132 mmol/L — ABNORMAL LOW (ref 135–145)
Total Bilirubin: 0.9 mg/dL (ref 0.3–1.2)
Total Protein: 7 g/dL (ref 6.5–8.1)

## 2021-03-23 LAB — C-REACTIVE PROTEIN: CRP: 8.7 mg/dL — ABNORMAL HIGH (ref <1.0)

## 2021-03-23 LAB — HEPATITIS PANEL, ACUTE
HCV Ab: NONREACTIVE
Hep A IgM: NONREACTIVE
Hep B C IgM: NONREACTIVE
Hepatitis B Surface Ag: NONREACTIVE

## 2021-03-23 LAB — HEMOGLOBIN A1C
Hgb A1c MFr Bld: 6.2 % — ABNORMAL HIGH (ref 4.8–5.6)
Mean Plasma Glucose: 131.24 mg/dL

## 2021-03-23 LAB — HIV ANTIBODY (ROUTINE TESTING W REFLEX): HIV Screen 4th Generation wRfx: NONREACTIVE

## 2021-03-23 MED ORDER — CHLORHEXIDINE GLUCONATE CLOTH 2 % EX PADS
6.0000 | MEDICATED_PAD | Freq: Every day | CUTANEOUS | Status: DC
  Administered 2021-03-23 – 2021-03-24 (×2): 6 via TOPICAL

## 2021-03-23 NOTE — TOC Initial Note (Signed)
Transition of Care [facility]) - Initial/Assessment Note   Patient Details  Name: Harold Robertson MRN: [id number] Date of Birth: 07-Oct-1950  Transition of Care [facility]) CM/SW Contact:    Sherie Don, LCSW Phone Number: 03/23/2021, 1:22 PM  Clinical Narrative: CSW met with patient to discuss discharge needs. Patient will need to discharge home on several weeks of IV antibiotics. Patient is agreeable to referrals. CSW made IV antibiotics referral to [facility] with [facility]. Pam to assist with setting up [facility] through [place]. Patient updated. TOC to follow.  Expected Discharge Plan: [place] Barriers to Discharge: Continued Medical Work up  Patient Goals and CMS Choice Patient states their goals for this hospitalization and ongoing recovery are:: Return home with IV antibiotics CMS Medicare.gov Compare Post Acute Care list provided to:: Patient Choice offered to / list presented to : Patient  Expected Discharge Plan and Services Expected Discharge Plan: [place] In-house Referral: Clinical Social Work Post Acute Care Choice: [place] arrangements for the past 2 months: [place]           DME Arranged: N/A DME Agency: NA HH Arranged: RN, IV Antibiotics HH Agency: [employer], [place] Date [place]: 03/23/21 Representative spoke with at [place]: [place]  Prior Living Arrangements/Services Living arrangements for the past 2 months: [place] Patient language and need for interpreter reviewed:: Yes Do you feel safe going back to the place where you live?: Yes      Need for Family Participation in Patient Care: No (Comment) Care giver support system in place?: Yes (comment) Criminal Activity/Legal Involvement Pertinent to Current Situation/Hospitalization: No - Comment as needed  Activities of Daily Living Home Assistive Devices/Equipment: Hearing aid, Blood pressure cuff, Crutches, Cane (specify quad or  straight), Bedside commode/3-in-1, Grab bars in shower ADL Screening (condition at time of admission) Patient's cognitive ability adequate to safely complete daily activities?: Yes Is the patient deaf or have difficulty hearing?: Yes Does the patient have difficulty seeing, even when wearing glasses/contacts?: No Does the patient have difficulty concentrating, remembering, or making decisions?: No Patient able to express need for assistance with ADLs?: Yes Does the patient have difficulty dressing or bathing?: No Independently performs ADLs?: Yes (appropriate for developmental age) Does the patient have difficulty walking or climbing stairs?: Yes Weakness of Legs: None Weakness of Arms/Hands: None  Permission Sought/Granted Permission sought to share information with : Other (comment) Permission granted to share information with : Yes, Verbal Permission Granted Permission granted to share info w AGENCY: [employer], [place] agencies  Emotional Assessment Appearance:: Appears stated age Attitude/Demeanor/Rapport: Engaged Affect (typically observed): Accepting Orientation: : Oriented to Self, Oriented to Place, Oriented to  Time, Oriented to Situation Alcohol / Substance Use: Tobacco Use Psych Involvement: No (comment)  Admission diagnosis:  Infection of total right knee replacement [facility]) [T84.53XA] Patient Active Problem List   Diagnosis Date Noted   Infection of total right knee replacement ([place]) 03/22/2021   CAD (coronary artery disease) 03/16/2021   PAF (paroxysmal atrial fibrillation) ([place])    Atrial flutter ([place]) 12/29/2019   Secondary hypercoagulable state ([place]) 12/29/2019   Sensorineural hearing loss (SNHL) of both ears 05/01/2017   Hyperlipidemia 11/16/2016   Primary osteoarthritis of right knee 06/12/2016   Vitamin D deficiency 05/12/2016   Presence of permanent cardiac pacemaker 05/10/2016   Left ventricular dysfunction 05/10/2016   Sepsis associated hypotension ([place])  01/26/2016   UTI (urinary tract infection) 01/26/2016  Sepsis ([place]) 01/26/2016   Complete heart block ([place]) 09/08/2015   Hypertension 07/17/2011   Left bundle branch block 07/17/2011   Obstructive sleep apnea 10/25/2009   PCP:  [name], MD Pharmacy:   [id number] [place] [address]Phone: [phone]: [phone] [facility]Phone: [phone]: [phone] Readmission Risk Interventions No flowsheet data found.

## 2021-03-23 NOTE — Progress Notes (Signed)
Physical Therapy Treatment Patient Details Name: Harold Robertson MRN: [id number] DOB: Jan 29, 1951 Today's Date: 03/23/2021   History of Present Illness Pt is a 70 year old male s/p TOTAL KNEE ARTHROPLASTY WITH REVISION COMPONENTS (POLY EXCHANGE); I&D (Right) on 03/22/21.  PMHx: sleep apnea, pacemaker placement, HTN, R TKA    PT Comments    Pt ambulated in hallway and practiced safe stair technique.  Pt mobilizing well and awaiting plans for d/c home with antibiotics.      Recommendations for follow up therapy are one component of a multi-disciplinary discharge planning process, led by the attending physician.  Recommendations may be updated based on patient status, additional functional criteria and insurance authorization.  Follow Up Recommendations  Follow physician's recommendations for discharge plan and follow up therapies     Assistance Recommended at Discharge    Equipment Recommendations  None recommended by PT    Recommendations for Other Services       Precautions / Restrictions Precautions Precautions: Fall;Knee Precaution Comments: per notes, pt to wear KI for 3 days Required Braces or Orthoses: Knee Immobilizer - Right Knee Immobilizer - Right: On at all times Restrictions RLE Weight Bearing: Weight bearing as tolerated     Mobility  Bed Mobility Overal bed mobility: Needs Assistance Bed Mobility: Supine to Sit;Sit to Supine     Supine to sit: Min guard;HOB elevated Sit to supine: Min guard;HOB elevated   General bed mobility comments: cues for self assist    Transfers Overall transfer level: Needs assistance Equipment used: Rolling walker (2 wheels) Transfers: Sit to/from Stand Sit to Stand: Min guard           General transfer comment: verbal cues for UE and LE positioning    Ambulation/Gait Ambulation/Gait assistance: Min guard Gait Distance (Feet): 380 Feet Assistive device: Rolling walker (2 wheels) Gait Pattern/deviations:  Step-through pattern;Decreased stride length       General Gait Details: verbal cues for sequence, RW positioning, posture; tolerated improved distance well   Stairs Stairs: Yes Stairs assistance: Min guard Stair Management: Forwards;One rail Left;With cane;Step to pattern Number of Stairs: 2 General stair comments: verbal cues for safety and sequence   Wheelchair Mobility    Modified Rankin (Stroke Patients Only)       Balance                                            Cognition Arousal/Alertness: Awake/alert Behavior During Therapy: WFL for tasks assessed/performed Overall Cognitive Status: Within Functional Limits for tasks assessed                                          Exercises Total Joint Exercises Ankle Circles/Pumps: AROM;10 reps;Both    General Comments        Pertinent Vitals/Pain Pain Assessment: 0-10 Pain Score: 2  Pain Location: right knee Pain Descriptors / Indicators: Sore;Aching Pain Intervention(s): Premedicated before session;Repositioned;Monitored during session    Home Living                          Prior Function            PT Goals (current goals can now be found in the care plan section) Acute Rehab PT Goals PT  Goal Formulation: With patient Time For Goal Achievement: 03/30/21 Potential to Achieve Goals: Good Progress towards PT goals: Progressing toward goals    Frequency    7X/week      PT Plan Current plan remains appropriate    Co-evaluation              AM-PAC PT "6 Clicks" Mobility   Outcome Measure  Help needed turning from your back to your side while in a flat bed without using bedrails?: A Little Help needed moving from lying on your back to sitting on the side of a flat bed without using bedrails?: A Little Help needed moving to and from a bed to a chair (including a wheelchair)?: A Little Help needed standing up from a chair using your arms (e.g.,  wheelchair or bedside chair)?: A Little Help needed to walk in hospital room?: A Little Help needed climbing 3-5 steps with a railing? : A Little 6 Click Score: 18    End of Session Equipment Utilized During Treatment: Gait belt;Right knee immobilizer Activity Tolerance: Patient tolerated treatment well Patient left: with call bell/phone within reach;in bed;with bed alarm set Nurse Communication: Mobility status PT Visit Diagnosis: Other abnormalities of gait and mobility (R26.89)     Time: 4076-8088 PT Time Calculation (min) (ACUTE ONLY): 15 min  Charges:  $Gait Training: 8-22 mins                    Paulino Door, DPT Acute Rehabilitation Services Pager: [phone] Office: [phone]    Janan Halter [place] 03/23/2021, 4:57 PM

## 2021-03-23 NOTE — Plan of Care (Signed)
°  Problem: Activity: Goal: Risk for activity intolerance will decrease 03/23/2021 0753 by Penelope Galas, RN Outcome: Progressing 03/22/2021 1824 by Penelope Galas, RN Outcome: Progressing   Problem: Pain Managment: Goal: General experience of comfort will improve 03/23/2021 0753 by Penelope Galas, RN Outcome: Progressing 03/22/2021 1824 by Penelope Galas, RN Outcome: Progressing   Problem: Safety: Goal: Ability to remain free from injury will improve 03/23/2021 0753 by Penelope Galas, RN Outcome: Progressing 03/22/2021 1824 by Penelope Galas, RN Outcome: Progressing

## 2021-03-23 NOTE — Evaluation (Signed)
Physical Therapy Evaluation Patient Details Name: Harold Robertson MRN: [id number] DOB: Jul 23, 1950 Today's Date: 03/23/2021  History of Present Illness  Pt is a 70 year old male s/p TOTAL KNEE ARTHROPLASTY WITH REVISION COMPONENTS (POLY EXCHANGE); I&D (Right) on 03/22/21.  PMHx: sleep apnea, pacemaker placement, HTN, R TKA  Clinical Impression  Patient is s/p above surgery resulting in functional limitations due to the deficits listed below (see PT Problem List). Patient will benefit from skilled PT to increase their independence and safety with mobility to allow discharge to the venue listed below.  Pt assisted with ambulating in hallway and maintained KI, pt also aware to use KI with mobility for the next 3 days (per ortho notes).  Pt reports his wife is still recovering from ankle injury/surgery, so he will need to be pretty independent upon d/c.       Recommendations for follow up therapy are one component of a multi-disciplinary discharge planning process, led by the attending physician.  Recommendations may be updated based on patient status, additional functional criteria and insurance authorization.  Follow Up Recommendations Follow physician's recommendations for discharge plan and follow up therapies    Assistance Recommended at Discharge    Functional Status Assessment Patient has had a recent decline in their functional status and demonstrates the ability to make significant improvements in function in a reasonable and predictable amount of time.  Equipment Recommendations  None recommended by PT    Recommendations for Other Services       Precautions / Restrictions Precautions Precautions: Fall;Knee Precaution Comments: per notes, pt to wear KI for 3 days Required Braces or Orthoses: Knee Immobilizer - Right Knee Immobilizer - Right: On at all times Restrictions RLE Weight Bearing: Weight bearing as tolerated      Mobility  Bed Mobility Overal bed mobility: Needs  Assistance Bed Mobility: Supine to Sit     Supine to sit: Min guard;HOB elevated     General bed mobility comments: cues for self assist    Transfers Overall transfer level: Needs assistance Equipment used: Rolling walker (2 wheels) Transfers: Sit to/from Stand Sit to Stand: Min guard           General transfer comment: verbal cues for UE and LE positioning    Ambulation/Gait Ambulation/Gait assistance: Min guard Gait Distance (Feet): 120 Feet Assistive device: Rolling walker (2 wheels) Gait Pattern/deviations: Step-through pattern;Decreased stride length       General Gait Details: verbal cues for sequence, RW positioning, posture; pt recalls heel to toe  Stairs            Wheelchair Mobility    Modified Rankin (Stroke Patients Only)       Balance                                             Pertinent Vitals/Pain Pain Assessment: 0-10 Pain Score: 3  Pain Location: right knee Pain Descriptors / Indicators: Sore;Aching Pain Intervention(s): Monitored during session;Repositioned;Premedicated before session    Home Living Family/patient expects to be discharged to:: Private residence Living Arrangements: Spouse/significant other Available Help at Discharge: Family Type of Home: House Home Access: Stairs to enter Entrance Stairs-Rails: None Entrance Stairs-Number of Steps: 3   Home Layout: Able to live on main level with bedroom/bathroom Home Equipment: Conservation officer, nature (2 wheels)      Prior Function Prior Level of Function :  Independent/Modified Independent             Mobility Comments: was using cane prior to surgery       Hand Dominance        Extremity/Trunk Assessment        Lower Extremity Assessment Lower Extremity Assessment: RLE deficits/detail RLE Deficits / Details: pt able to perform SLR, maintained KI per precautions, pt able to perform ankle pumps       Communication   Communication: No  difficulties  Cognition Arousal/Alertness: Awake/alert Behavior During Therapy: WFL for tasks assessed/performed Overall Cognitive Status: Within Functional Limits for tasks assessed                                          General Comments      Exercises Total Joint Exercises Ankle Circles/Pumps: AROM;10 reps;Both   Assessment/Plan    PT Assessment Patient needs continued PT services  PT Problem List Decreased strength;Decreased activity tolerance;Decreased mobility;Decreased range of motion;Pain;Decreased knowledge of use of DME;Decreased knowledge of precautions       PT Treatment Interventions Stair training;Gait training;DME instruction;Therapeutic exercise;Balance training;Functional mobility training;Therapeutic activities;Patient/family education    PT Goals (Current goals can be found in the Care Plan section)  Acute Rehab PT Goals PT Goal Formulation: With patient Time For Goal Achievement: 03/30/21 Potential to Achieve Goals: Good    Frequency 7X/week   Barriers to discharge        Co-evaluation               AM-PAC PT "6 Clicks" Mobility  Outcome Measure Help needed turning from your back to your side while in a flat bed without using bedrails?: A Little Help needed moving from lying on your back to sitting on the side of a flat bed without using bedrails?: A Little Help needed moving to and from a bed to a chair (including a wheelchair)?: A Little Help needed standing up from a chair using your arms (e.g., wheelchair or bedside chair)?: A Little Help needed to walk in hospital room?: A Little Help needed climbing 3-5 steps with a railing? : A Little 6 Click Score: 18    End of Session Equipment Utilized During Treatment: Gait belt Activity Tolerance: Patient tolerated treatment well Patient left: in chair;with call bell/phone within reach;with chair alarm set Nurse Communication: Mobility status PT Visit Diagnosis: Other  abnormalities of gait and mobility (R26.89)    Time: 9485-4627 PT Time Calculation (min) (ACUTE ONLY): 24 min   Charges:   PT Evaluation $PT Eval Low Complexity: 1 Low PT Treatments $Gait Training: 8-22 mins       Thomasene Mohair PT, DPT Acute Rehabilitation Services Pager: [phone] Office: [phone]   Janan Halter [place] 03/23/2021, 1:27 PM

## 2021-03-23 NOTE — Progress Notes (Signed)
Subjective: Patient reports pain as mild to moderate. Only feels if moving leg. Tolerating diet. Was finally able to urinate on his own after surgery so no urology consult was needed. No CP, SOB.  Has not mobilized OOB with PT yet.  Objective:   VITALS:   Vitals:   03/22/21 2006 03/23/21 0021 03/23/21 0523 03/23/21 0929  BP: (!) 143/72 110/74 123/87 114/74  Pulse: 66 68 60 60  Resp: 17 18 16 18   Temp: 98 F (36.7 C) 98.9 F (37.2 C) 97.6 F (36.4 C) (!) 97.5 F (36.4 C)  TempSrc: Oral Oral Oral Oral  SpO2: 96% 96% 100% 94%  Weight:      Height:       CBC Latest Ref Rng & Units 03/22/2021 03/17/2021 03/01/2021  WBC 4.0 - 10.5 K/uL 9.3 10.4 -  Hemoglobin 13.0 - 17.0 g/dL 14/08/2020 15.4 00.8  Hematocrit 39.0 - 52.0 % 39.6 40.1 39.0  Platelets 150 - 400 K/uL 211 254 -   BMP Latest Ref Rng & Units 03/23/2021 03/22/2021 03/17/2021  Glucose 70 - 99 mg/dL 03/19/2021) 195(K) 932(I)  BUN 8 - 23 mg/dL 15 23 13   Creatinine 0.61 - 1.24 mg/dL 712(W 5.80  BUN/Creat Ratio 6 - 22 (calc) - - NOT APPLICABLE  Sodium 135 - 145 mmol/L 132(L) 133(L) 137  Potassium 3.5 - 5.1 mmol/L 4.5 4.3 5.0  Chloride 98 - 111 mmol/L 99 99 100  CO2 22 - 32 mmol/L 28 25 27   Calcium 8.9 - 10.3 mg/dL 9.98) 3.38) )   Intake/Output      12/27 0701 12/28 0700 12/28 0701 12/29 0700   P.O. 240    I.V. (mL/kg) 1050 (8.2)    IV Piggyback 300    Total Intake(mL/kg) 1590 (12.4)    Urine (mL/kg/hr) 1600 300 (0.7)   Blood 100    Total Output 1700 300   Net -110 -300        Urine Occurrence 1 x       Physical Exam: General: NAD.  Laying in bed, calm Resp: No increased wob Cardio: regular rate and rhythm ABD soft Neurologically intact MSK Neurovascularly intact Sensation intact distally Intact pulses distally Dorsiflexion/Plantar flexion intact Incision: dressing C/D/I KI in place  Assessment: 1 Day Post-Op  S/P Procedure(s) (LRB): TOTAL KNEE ARTHROPLASTY WITH REVISION COMPONENTS (POLY  EXCHANGE); I&D (Right) CYSTOSCOPY FLEXIBLE; ATTEMPTED FOLEY INSERTION (N/A) by Dr. 1/29. Murphy on 03/22/21  Principal Problem:   Infection of total right knee replacement (HCC)   Plan: IV ABX Await specimen results  Advance diet Up with therapy Incentive Spirometry Elevate and Apply ice  Weightbearing: WBAT RLE Insicional and dressing care: Dressings left intact until follow-up and Reinforce dressings as needed Orthopedic device(s):  KI when mobilizing for 3 days Showering: Keep dressing dry VTE prophylaxis:  Eliquis 5mg  bid  , SCDs, ambulation Pain control: Tylenol, Oxycodone, Dilaudid PRN Follow - up plan: 2 weeks Contact information:  1/30 MD, Jewel Baize PA-C  Dispo: Home with [facility] to help with his PICC line ABX once ID is satisfied with the lab results their note states they want to order and once he mobilizes with PT.      Anticipated LOS equal to or greater than 2 midnights due to - Age 55 and older with one or more of the following:  - Obesity  - Expected need for hospital services (PT, OT, Nursing) required for safe  discharge  - Anticipated need for postoperative skilled nursing  care or inpatient rehab  - Active co-morbidities: infected right TKA OR   - Unanticipated findings during/Post Surgery: Infected knee joint or operative site  - Patient is a high risk of re-admission due to: PICC line ABX   Britt Bottom, PA-C Office [phone] 03/23/2021, 10:22 AM

## 2021-03-23 NOTE — Consult Note (Signed)
[place] for Infectious Disease    Date of Admission:  03/22/2021     Reason for Consult:PJI     Referring Physician: Dr Percell Miller   ASSESSMENT:    70 y.o. male admitted with:  Right knee infected TKA: status post I&D and poly exchange 03/22/21.  Preoperative aspiration cultures done in clinic grew streptococcus agalactiae and patient started on ceftriaxone post operatively. Elevated LFTs: Noted by his PCP on 12/22 and continued to be elevated on labs here.  Were previously normal in early December.  PCP had requested hepatitis panel and RUQ [place].  RECOMMENDATIONS:    Continue ceftriaxone 2 gm daily Follow up cultures Lab monitoring Wound care, glycemic control Hepatitis labs Repeat CMP RUQ [place] Will provide final reccs tomorrow.  If cultures not updated, I will follow them as outpatient.   Principal Problem:   Infection of total right knee replacement (HCC)   MEDICATIONS:    Scheduled Meds:  acetaminophen  1,000 mg Oral Q6H   apixaban  5 mg Oral BID   atorvastatin  40 mg Oral QHS   carvedilol  12.5 mg Oral BID   docusate sodium  100 mg Oral BID   lisinopril  40 mg Oral Daily   sodium chloride flush  10-40 mL Intracatheter Q12H   traMADol  50 mg Oral Q6H   Continuous Infusions:  cefTRIAXone (ROCEPHIN)  IV 2 g (03/22/21 1849)   methocarbamol (ROBAXIN) IV     PRN Meds:.acetaminophen, bisacodyl, diphenhydrAMINE, HYDROmorphone (DILAUDID) injection, methocarbamol **OR** methocarbamol (ROBAXIN) IV, metoCLOPramide **OR** metoCLOPramide (REGLAN) injection, ondansetron **OR** ondansetron (ZOFRAN) IV, oxyCODONE, oxyCODONE, polyethylene glycol, sodium chloride flush  HPI:    Harold Robertson is a 70 y.o. male with PMHx of atrial fibrillation, HLD, HTN, and prior TKA who is admitted with infected right TKA.  He had original TKA done in 2018 with Dr Percell Miller and did well post operatively.  He had the flu earlier this month and shortly afterwards developed right knee pain  that progressively worsened.  This has impacted his mobility and daily activity.  He had knee aspirated in the clinic with 13cc of cloudy serosanguinous fluid.  He reports being given a course of prednisone.  Cultures grew streptococcus agalactiae.  He went for I&D and poly exchange yesterday with Dr Percell Miller.  PICC line was placed and we are consulted for abx recommendations.   He reports not taking any antibiotics prior to admission.  Baseline ESR 74, CRP 8.7.  HIV negative.  Operative Gram stain is negative and cultures are NGTD.  He was started on Ceftriaxone post operatively.   Of note, he is also found to have newly elevated LFTs.   Past Medical History:  Diagnosis Date   Arthritis    Atrial fibrillation (HCC)    Complete heart block (HCC)    Hypertension    OSA on CPAP    Presence of permanent cardiac pacemaker     Social History   Tobacco Use   Smoking status: Former    Types: Cigars   Smokeless tobacco: Current    Types: Snuff  Vaping Use   Vaping Use: Never used  Substance Use Topics   Alcohol use: Yes    Alcohol/week: 1.0 standard drink    Types: 1 Cans of beer per week    Comment: occ   Drug use: No    Family History  Problem Relation Age of Onset   Heart disease Father 72       AMI  Liver cancer Mother    Cancer Mother 37       Colon cancer with liver mets   Heart disease Brother 59       AMI   Heart disease Maternal Grandmother     No Known Allergies  Review of Systems  Constitutional:  Negative for chills and fever.  Respiratory: Negative.    Cardiovascular: Negative.   Gastrointestinal: Negative.   Musculoskeletal:  Positive for joint pain.  Skin: Negative.   All other systems reviewed and are negative.  OBJECTIVE:   Blood pressure 123/87, pulse 60, temperature 97.6 F (36.4 C), temperature source Oral, resp. rate 16, height $RemoveBe'5\' 11"'FrGCTPrwY$  (1.803 m), weight 128.4 kg, SpO2 100 %. Body mass index is 39.47 kg/m.  Physical Exam Constitutional:       General: He is not in acute distress.    Appearance: Normal appearance.  HENT:     Head: Normocephalic and atraumatic.     Nose: Nose normal.     Mouth/Throat:     Mouth: Mucous membranes are moist.  Eyes:     Extraocular Movements: Extraocular movements intact.     Conjunctiva/sclera: Conjunctivae normal.  Pulmonary:     Effort: Pulmonary effort is normal. No respiratory distress.  Abdominal:     General: There is no distension.     Palpations: Abdomen is soft.  Musculoskeletal:     Cervical back: Normal range of motion and neck supple.     Comments: Right UE Picc in place. Right knee wrapped.    Skin:    General: Skin is warm and dry.  Neurological:     General: No focal deficit present.     Mental Status: He is alert and oriented to person, place, and time.  Psychiatric:        Mood and Affect: Mood normal.        Behavior: Behavior normal.     Lab Results: Lab Results  Component Value Date   WBC 9.3 03/22/2021   HGB 13.0 03/22/2021   HCT 39.6 03/22/2021   MCV 89.4 03/22/2021   PLT 211 03/22/2021    Lab Results  Component Value Date   NA 133 (L) 03/22/2021   K 4.3 03/22/2021   CO2 25 03/22/2021   GLUCOSE 190 (H) 03/22/2021   BUN 23 03/22/2021   CREATININE 1.00 03/22/2021   CALCIUM 8.6 (L) 03/22/2021   GFRNONAA >60 03/22/2021   GFRAA >60 12/29/2019    Lab Results  Component Value Date   ALT 78 (H) 03/22/2021   AST 49 (H) 03/22/2021   ALKPHOS 66 03/22/2021   BILITOT 1.1 03/22/2021       Component Value Date/Time   CRP 8.7 (H) 03/22/2021 1917       Component Value Date/Time   ESRSEDRATE 74 (H) 03/22/2021 1917    I have reviewed the micro and lab results in Epic.  Imaging: DG Knee Right Port  Result Date: 03/22/2021 CLINICAL DATA:  Post revision of right total knee replacement. EXAM: PORTABLE RIGHT KNEE - 1-2 VIEW COMPARISON:  07/11/2016 FINDINGS: Right total knee arthroplasty with patellar femoral component. Visualized components appear well  seated. Right knee effusion and soft tissue gas are consistent with recent surgery. No acute fracture or dislocation. IMPRESSION: Right total knee arthroplasty.  Components appear well seated. Electronically Signed   By: Lucienne Capers M.D.   On: 03/22/2021 16:39   [place] EKG SITE RITE  Result Date: 03/22/2021 If Site Rite image not attached, placement could not be  confirmed due to current cardiac rhythm.  [place] EKG Site Rite  Result Date: 03/22/2021 If Site Rite image not attached, placement could not be confirmed due to current cardiac rhythm.    Imaging independently reviewed in Epic.  [facility] for [facility]ious [facility] [phone] pager 03/23/2021, 8:19 AM

## 2021-03-24 ENCOUNTER — Other Ambulatory Visit (HOSPITAL_COMMUNITY): Payer: Self-pay

## 2021-03-24 DIAGNOSIS — R7989 Other specified abnormal findings of blood chemistry: Secondary | ICD-10-CM | POA: Diagnosis not present

## 2021-03-24 DIAGNOSIS — T8453XA Infection and inflammatory reaction due to internal right knee prosthesis, initial encounter: Secondary | ICD-10-CM | POA: Diagnosis not present

## 2021-03-24 MED ORDER — ACETAMINOPHEN 500 MG PO TABS: 1000.0000 mg | ORAL_TABLET | Freq: Four times a day (QID) | ORAL | 0 refills | Status: AC | PRN

## 2021-03-24 MED ORDER — CELECOXIB 100 MG PO CAPS: 100.0000 mg | ORAL_CAPSULE | Freq: Two times a day (BID) | ORAL | 0 refills | Status: AC | PRN

## 2021-03-24 MED ORDER — METHOCARBAMOL 750 MG PO TABS: 750.0000 mg | ORAL_TABLET | Freq: Three times a day (TID) | ORAL | 0 refills | Status: DC | PRN

## 2021-03-24 MED ORDER — OXYCODONE HCL 5 MG PO TABS: 5.0000 mg | ORAL_TABLET | Freq: Four times a day (QID) | ORAL | 0 refills | Status: DC | PRN

## 2021-03-24 MED ORDER — ONDANSETRON 4 MG PO TBDP: 4.0000 mg | ORAL_TABLET | Freq: Two times a day (BID) | ORAL | 0 refills | Status: DC | PRN

## 2021-03-24 MED ORDER — CEFTRIAXONE IV (FOR PTA / DISCHARGE USE ONLY): 2.0000 g | INTRAVENOUS | 0 refills | Status: AC

## 2021-03-24 NOTE — Progress Notes (Signed)
Physical Therapy Treatment Patient Details Name: Harold Robertson MRN: [id number] DOB: 1950-09-30 Today's Date: 03/24/2021   History of Present Illness Pt is a 70 year old male s/p TOTAL KNEE ARTHROPLASTY WITH REVISION COMPONENTS (POLY EXCHANGE); I&D (Right) on 03/22/21.  PMHx: sleep apnea, pacemaker placement, HTN, R TKA    PT Comments    Pt reports being more sore today however eager to mobilize and anticipates d/c home.  Pt ambulated in hallway and practiced safe stair technique again.  Pt feels ready for d/c home and had no further questions.    Recommendations for follow up therapy are one component of a multi-disciplinary discharge planning process, led by the attending physician.  Recommendations may be updated based on patient status, additional functional criteria and insurance authorization.  Follow Up Recommendations  Follow physician's recommendations for discharge plan and follow up therapies     Assistance Recommended at Discharge    Equipment Recommendations  None recommended by PT    Recommendations for Other Services       Precautions / Restrictions Precautions Precautions: Fall;Knee Precaution Comments: per notes, pt to wear KI for 3 days Required Braces or Orthoses: Knee Immobilizer - Right Knee Immobilizer - Right: On at all times Restrictions Weight Bearing Restrictions: No RLE Weight Bearing: Weight bearing as tolerated     Mobility  Bed Mobility Overal bed mobility: Needs Assistance Bed Mobility: Supine to Sit     Supine to sit: Supervision          Transfers Overall transfer level: Needs assistance Equipment used: Rolling walker (2 wheels) Transfers: Sit to/from Stand Sit to Stand: Supervision           General transfer comment: verbal cues for UE and LE positioning    Ambulation/Gait Ambulation/Gait assistance: Min guard;Supervision Gait Distance (Feet): 200 Feet Assistive device: Rolling walker (2 wheels) Gait  Pattern/deviations: Step-through pattern;Decreased stride length       General Gait Details: verbal cues for RW positioning, posture   Stairs Stairs: Yes Stairs assistance: Min guard Stair Management: Forwards;One rail Left;With cane;Step to pattern Number of Stairs: 2 General stair comments: verbal cues for safety and sequence   Wheelchair Mobility    Modified Rankin (Stroke Patients Only)       Balance                                            Cognition Arousal/Alertness: Awake/alert Behavior During Therapy: WFL for tasks assessed/performed Overall Cognitive Status: Within Functional Limits for tasks assessed                                          Exercises      General Comments        Pertinent Vitals/Pain Pain Assessment: 0-10 Pain Score: 4  Pain Location: right knee Pain Descriptors / Indicators: Sore;Aching Pain Intervention(s): Repositioned;Monitored during session    Home Living                          Prior Function            PT Goals (current goals can now be found in the care plan section) Progress towards PT goals: Progressing toward goals    Frequency  7X/week      PT Plan Current plan remains appropriate    Co-evaluation              AM-PAC PT "6 Clicks" Mobility   Outcome Measure  Help needed turning from your back to your side while in a flat bed without using bedrails?: A Little Help needed moving from lying on your back to sitting on the side of a flat bed without using bedrails?: A Little Help needed moving to and from a bed to a chair (including a wheelchair)?: A Little Help needed standing up from a chair using your arms (e.g., wheelchair or bedside chair)?: A Little Help needed to walk in hospital room?: A Little Help needed climbing 3-5 steps with a railing? : A Little 6 Click Score: 18    End of Session Equipment Utilized During Treatment: Gait belt;Right  knee immobilizer Activity Tolerance: Patient tolerated treatment well Patient left: with call bell/phone within reach;in chair;with nursing/sitter in room Nurse Communication: Mobility status PT Visit Diagnosis: Other abnormalities of gait and mobility (R26.89)     Time: [id number] PT Time Calculation (min) (ACUTE ONLY): 13 min  Charges:  $Gait Training: 8-22 mins                     Harold Robertson, DPT Acute Rehabilitation Services Pager: [phone] Office: [place] 03/24/2021, 12:59 PM

## 2021-03-24 NOTE — Progress Notes (Signed)
[place] for Infectious Disease  Date of Admission:  03/22/2021           Reason for visit: Follow up on PJI  Current antibiotics: Ceftriaxone 12/27-pres   ASSESSMENT:    70 y.o. male admitted with:  Right infected TKA: s/p I&D and poly exchange 12/27.  Cx from preoperative aspiration done at ortho clinic grew GBS.  OR cx from 12/27 are NGTD.  Patient stable on CTX. Elevated LFTs:  noted by his PCP on 12/22 and had planned for hepatitis panel and RUQ [place].  LFTs are improved today.  Hepatitis panel negative.  RUQ [place] was limited due to body habitus and overlying bowel gas.  He had cholelithiasis noted without acute cholecystitis.  Further liver protocol CT abd/pelvis recommended.  RECOMMENDATIONS:    Continue ceftriaxone FOllow cultures which can be done after hospital discharge Wound care, glycemic control See OPAT note below No objection to DC.  Will message PCP regarding follow up imaging of liver as noted on RUQ [place] but believe this can be further evaluated as an outpatient  Diagnosis: RIght knee PJI  Culture Result: GBS  No Known Allergies  OPAT Orders Discharge antibiotics to be given via PICC line Discharge antibiotics: Per pharmacy protocol  Ceftriaxone 2gm daily  Duration: 6 weeks  End Date: 05/03/20  [facility] Care Per Protocol:  Home health RN for IV administration and teaching; PICC line care and labs.    Labs weekly while on IV antibiotics: _xx_ CBC with differential __ BMP _xx_ CMP _xx_ CRP _xx_ ESR __ Vancomycin trough __ CK  _xx_ Please pull PIC at completion of IV antibiotics __ Please leave PIC in place until doctor has seen patient or been notified  Fax weekly labs to [phone]  Clinic Follow Up Appt: 04/13/20 @ 3:30pm with Dr Juleen China    Principal Problem:   Infection of total right knee replacement ([facility])    MEDICATIONS:    Scheduled Meds:  apixaban  5 mg Oral BID   atorvastatin  40 mg Oral QHS   carvedilol  12.5  mg Oral BID   Chlorhexidine Gluconate Cloth  6 each Topical Daily   docusate sodium  100 mg Oral BID   lisinopril  40 mg Oral Daily   sodium chloride flush  10-40 mL Intracatheter Q12H   traMADol  50 mg Oral Q6H   Continuous Infusions:  cefTRIAXone (ROCEPHIN)  IV 2 g (03/23/21 1700)   methocarbamol (ROBAXIN) IV     PRN Meds:.acetaminophen, bisacodyl, diphenhydrAMINE, HYDROmorphone (DILAUDID) injection, methocarbamol **OR** methocarbamol (ROBAXIN) IV, metoCLOPramide **OR** metoCLOPramide (REGLAN) injection, ondansetron **OR** ondansetron (ZOFRAN) IV, oxyCODONE, oxyCODONE, polyethylene glycol, sodium chloride flush  SUBJECTIVE:   24 hour events:  No acute events   Looking forward to going home.  NO fevers, chills.  Pain controlled.  Had questions regarding LFTs and [place].  Review of Systems  All other systems reviewed and are negative.    OBJECTIVE:   Blood pressure 111/69, pulse 66, temperature 98.5 F (36.9 C), temperature source Oral, resp. rate 17, height 5' 11" (1.803 m), weight 128.4 kg, SpO2 95 %. Body mass index is 39.47 kg/m.  Physical Exam Constitutional:      General: He is not in acute distress.    Appearance: Normal appearance.  Pulmonary:     Effort: Pulmonary effort is normal. No respiratory distress.  Musculoskeletal:     Comments: Right knee wrapped. Right UE PICC.  Skin:    General: Skin  is warm and dry.  Neurological:     General: No focal deficit present.     Mental Status: He is alert and oriented to person, place, and time.  Psychiatric:        Mood and Affect: Mood normal.        Behavior: Behavior normal.     Lab Results: Lab Results  Component Value Date   WBC 9.3 03/22/2021   HGB 13.0 03/22/2021   HCT 39.6 03/22/2021   MCV 89.4 03/22/2021   PLT 211 03/22/2021    Lab Results  Component Value Date   NA 132 (L) 03/23/2021   K 4.5 03/23/2021   CO2 28 03/23/2021   GLUCOSE 166 (H) 03/23/2021   BUN 15 03/23/2021   CREATININE 0.67  03/23/2021   CALCIUM 8.6 (L) 03/23/2021   GFRNONAA >60 03/23/2021   GFRAA >60 12/29/2019    Lab Results  Component Value Date   ALT 62 (H) 03/23/2021   AST 35 03/23/2021   ALKPHOS 65 03/23/2021   BILITOT 0.9 03/23/2021       Component Value Date/Time   CRP 8.7 (H) 03/22/2021 1917       Component Value Date/Time   ESRSEDRATE 74 (H) 03/22/2021 1917     I have reviewed the micro and lab results in Epic.  Imaging: DG Knee Right Port  Result Date: 03/22/2021 CLINICAL DATA:  Post revision of right total knee replacement. EXAM: PORTABLE RIGHT KNEE - 1-2 VIEW COMPARISON:  07/11/2016 FINDINGS: Right total knee arthroplasty with patellar femoral component. Visualized components appear well seated. Right knee effusion and soft tissue gas are consistent with recent surgery. No acute fracture or dislocation. IMPRESSION: Right total knee arthroplasty.  Components appear well seated. Electronically Signed   By: Lucienne Capers M.D.   On: 03/22/2021 16:39   [place] EKG SITE RITE  Result Date: 03/22/2021 If Site Rite image not attached, placement could not be confirmed due to current cardiac rhythm.  [place] EKG Site Rite  Result Date: 03/22/2021 If Site Rite image not attached, placement could not be confirmed due to current cardiac rhythm.  US Abdomen Limited RUQ (LIVER/GB)  Result Date: 03/23/2021 CLINICAL DATA:  Elevated liver function test. EXAM: ULTRASOUND ABDOMEN LIMITED RIGHT UPPER QUADRANT COMPARISON:  None. FINDINGS: Gallbladder: A 9.3 mm shadowing echogenic gallstone is seen within the dependent portion of the gallbladder lumen. The gallbladder wall measures 4.0 mm in thickness. No sonographic Murphy sign noted by sonographer. Common bile duct: Diameter: 4.0 mm Liver: No focal lesion identified. Within normal limits in parenchymal echogenicity. Portal vein is patent on color Doppler imaging with normal direction of blood flow towards the liver. Other: The study is markedly limited  secondary to the patient's body habitus and overlying bowel gas IMPRESSION: 1. Markedly limited study, as described above. Further evaluation of the liver with a liver protocol abdomen and pelvis CT is recommended. 2. Cholelithiasis, without acute cholecystitis. Electronically Signed   By: Virgina Norfolk M.D.   On: 03/23/2021 19:32     Imaging  independently reviewed in Epic.    Raynelle Highland for Infectious Disease [facility] [phone] pager 03/24/2021, 7:23 AM  I spent greater than 35 minutes with the patient including greater than 50% of time in face to face counsel of the patient and in coordination of their care.

## 2021-03-24 NOTE — Discharge Summary (Signed)
Physician Discharge Summary  Patient ID: Harold Robertson MRN: [id number] DOB/AGE: Sep 25, 1950 70 y.o.  Admit date: 03/22/2021 Discharge date: 03/24/2021  Admission Diagnoses: infection of right TKA  Discharge Diagnoses:  Principal Problem:   Infection of total right knee replacement [facility])   Discharged Condition: fair  Hospital Course: Patient underwent a right knee irrigation and debridement with revision of total knee components via poly exchange due to a joint infection discovered from a joint aspiration taken in our office last week. It shows the presence of Strep agalactiae in the right knee joint. The procedure was without complication and the patient was admitted for pain control, mobilization, and placement of a  PICC line for IV antibiotics. He is clear for discharge home with home health ordered to help with his IV ABX.   Consults: ID  Significant Diagnostic Studies: n/a  Treatments: IV hydration, antibiotics: Ancef and ceftriaxone, analgesia: acetaminophen and oxycodone and tramadol, anticoagulation: Eliquis, procedures: PICC line, and surgery: revision of right TKA  Discharge Exam: Blood pressure 111/69, pulse 66, temperature 98.5 F (36.9 C), temperature source Oral, resp. rate 17, height 5\' 11"  (1.803 m), weight 128.4 kg, SpO2 95 %. General appearance: alert, cooperative, and no distress Head: Normocephalic, without obvious abnormality, atraumatic Eyes: conjunctivae/corneas clear. PERRL, EOM's intact. Fundi benign. Resp: clear to auscultation bilaterally Cardio: regular rate and rhythm, S1, S2 normal, no murmur, click, rub or gallop GI: soft, non-tender; bowel sounds normal; no masses,  no organomegaly Extremities: extremities normal, atraumatic, no cyanosis or edema Pulses:  L brachial 2+ R brachial 2+  L radial 2+ R radial 2+  L inguinal 2+ R inguinal 2+  L popliteal 2+ R popliteal 2+  L posterior tibial 2+ R posterior tibial 2+  L dorsalis pedis 2+ R dorsalis  pedis 2+   Skin: Skin color, texture, turgor normal. No rashes or lesions Neurologic: Alert and oriented X 3, normal strength and tone. Normal symmetric reflexes. Normal coordination and gait Incision/Wound: c/d/i  Disposition: Discharge disposition: 06-Home-Health Care Svc       Discharge Instructions     [facility] pharmacist to adjust dose for Vancomycin, Aminoglycosides and other anti-infective therapies as requested by physician.   Complete by: As directed    [facility] to provide Cath Flo 2mg    Complete by: As directed    Administer for PICC line occlusion and as ordered by physician for other access device issues.   Anaphylaxis Kit: Provided to treat any anaphylactic reaction to the medication being provided to the patient if First Dose or when requested by physician   Complete by: As directed    Epinephrine 1mg /ml vial / amp: Administer 0.3mg  (0.64ml) subcutaneously once for moderate to severe anaphylaxis, nurse to call physician and pharmacy when reaction occurs and call 911 if needed for immediate care   Diphenhydramine 50mg /ml IV vial: Administer 25-50mg  IV/IM PRN for first dose reaction, rash, itching, mild reaction, nurse to call physician and pharmacy when reaction occurs   Sodium Chloride 0.9% NS 559ml IV: Administer if needed for hypovolemic blood pressure drop or as ordered by physician after call to physician with anaphylactic reaction   Call MD / Call 911   Complete by: As directed    If you experience chest pain or shortness of breath, CALL 911 and be transported to the hospital emergency room.  If you develope a fever above 101 F, pus (white drainage) or increased drainage or redness at the wound, or calf pain, call your surgeon's  office.   Change dressing on IV access line weekly and PRN   Complete by: As directed    Diet - low sodium heart healthy   Complete by: As directed    Discharge instructions   Complete by: As directed    You may  bear weight as tolerated. Keep your dressing on and dry until follow up. Take medicine to prevent blood clots as directed. Take pain medicine as needed with the goal of transitioning to over the counter medicines.    INSTRUCTIONS AFTER JOINT REPLACEMENT   Remove items at home which could result in a fall. This includes throw rugs or furniture in walking pathways ICE to the affected joint every three hours while awake for 30 minutes at a time, for at least the first 3-5 days, and then as needed for pain and swelling.  Continue to use ice for pain and swelling. You may notice swelling that will progress down to the foot and ankle.  This is normal after surgery.  Elevate your leg when you are not up walking on it.   Continue to use the breathing machine you got in the hospital (incentive spirometer) which will help keep your temperature down.  It is common for your temperature to cycle up and down following surgery, especially at night when you are not up moving around and exerting yourself.  The breathing machine keeps your lungs expanded and your temperature down.   DIET:  As you were doing prior to hospitalization, we recommend a well-balanced diet.  DRESSING / WOUND CARE / SHOWERING  You may shower 3 days after surgery, but keep the wounds dry during showering.  You may use an occlusive plastic wrap (Press'n Seal for example) with blue painter's tape at edges, NO SOAKING/SUBMERGING IN THE BATHTUB.  If the bandage gets wet, call the office.   ACTIVITY  Increase activity slowly as tolerated, but follow the weight bearing instructions below.   No driving for 6 weeks or until further direction given by your physician.  You cannot drive while taking narcotics.  No lifting or carrying greater than 10 lbs. until further directed by your surgeon. Avoid periods of inactivity such as sitting longer than an hour when not asleep. This helps prevent blood clots.  You may return to work once you are  authorized by your doctor.    WEIGHT BEARING   Weight bearing as tolerated with assist device (walker, cane, etc) as directed, use it as long as suggested by your surgeon or therapist, typically at least 4-6 weeks.   EXERCISES  Results after joint replacement surgery are often greatly improved when you follow the exercise, range of motion and muscle strengthening exercises prescribed by your doctor. Safety measures are also important to protect the joint from further injury. Any time any of these exercises cause you to have increased pain or swelling, decrease what you are doing until you are comfortable again and then slowly increase them. If you have problems or questions, call your caregiver or physical therapist for advice.   Rehabilitation is important following a joint replacement. After just a few days of immobilization, the muscles of the leg can become weakened and shrink (atrophy).  These exercises are designed to build up the tone and strength of the thigh and leg muscles and to improve motion. Often times heat used for twenty to thirty minutes before working out will loosen up your tissues and help with improving the range of motion but do not use heat  for the first two weeks following surgery (sometimes heat can increase post-operative swelling).   These exercises can be done on a training (exercise) mat, on the floor, on a table or on a bed. Use whatever works the best and is most comfortable for you.    Use music or television while you are exercising so that the exercises are a pleasant break in your day. This will make your life better with the exercises acting as a break in your routine that you can look forward to.   Perform all exercises about fifteen times, three times per day or as directed.  You should exercise both the operative leg and the other leg as well.  Exercises include:   Quad Sets - Tighten up the muscle on the front of the thigh (Quad) and hold for 5-10 seconds.    Straight Leg Raises - With your knee straight (if you were given a brace, keep it on), lift the leg to 60 degrees, hold for 3 seconds, and slowly lower the leg.  Perform this exercise against resistance later as your leg gets stronger.  Leg Slides: Lying on your back, slowly slide your foot toward your buttocks, bending your knee up off the floor (only go as far as is comfortable). Then slowly slide your foot back down until your leg is flat on the floor again.  Angel Wings: Lying on your back spread your legs to the side as far apart as you can without causing discomfort.  Hamstring Strength:  Lying on your back, push your heel against the floor with your leg straight by tightening up the muscles of your buttocks.  Repeat, but this time bend your knee to a comfortable angle, and push your heel against the floor.  You may put a pillow under the heel to make it more comfortable if necessary.   A rehabilitation program following joint replacement surgery can speed recovery and prevent re-injury in the future due to weakened muscles. Contact your doctor or a physical therapist for more information on knee rehabilitation.    CONSTIPATION  Constipation is defined medically as fewer than three stools per week and severe constipation as less than one stool per week.  Even if you have a regular bowel pattern at home, your normal regimen is likely to be disrupted due to multiple reasons following surgery.  Combination of anesthesia, postoperative narcotics, change in appetite and fluid intake all can affect your bowels.   YOU MUST use at least one of the following options; they are listed in order of increasing strength to get the job done.  They are all available over the counter, and you may need to use some, POSSIBLY even all of these options:    Drink plenty of fluids (prune juice may be helpful) and high fiber foods Colace 100 mg by mouth twice a day  Senokot for constipation as directed and as needed  Dulcolax (bisacodyl), take with full glass of water  Miralax (polyethylene glycol) once or twice a day as needed.  If you have tried all these things and are unable to have a bowel movement in the first 3-4 days after surgery call either your surgeon or your primary doctor.    If you experience loose stools or diarrhea, hold the medications until you stool forms back up.  If your symptoms do not get better within 1 week or if they get worse, check with your doctor.  If you experience "the worst abdominal pain ever" or develop  nausea or vomiting, please contact the office immediately for further recommendations for treatment.   ITCHING:  If you experience itching with your medications, try taking only a single pain pill, or even half a pain pill at a time.  You can also use Benadryl over the counter for itching or also to help with sleep.   TED HOSE STOCKINGS:  Use stockings on both legs until for at least 2 weeks or as directed by physician office. They may be removed at night for sleeping.  MEDICATIONS:  See your medication summary on the "After Visit Summary" that nursing will review with you.  You may have some home medications which will be placed on hold until you complete the course of blood thinner medication.  It is important for you to complete the blood thinner medication as prescribed.  Take medicines as prescribed.   You have several different medicines that work in different ways. - Tylenol is for mild to moderate pain. Try to take this medicine before turning to your narcotic medicines.  - Celebrex is to reduce pain / inflammation - Robaxin is for muscle spasms. This medicine can make you drowsy. - Oxycodone is a narcotic pain medicine.  Take this for severe pain. This medicine can be dehydrating / constipating. - Zofran is for nausea and vomiting.  - Eliquis is to prevent blood clots after surgery.   PRECAUTIONS:  If you experience chest pain or shortness of breath - call 911  immediately for transfer to the hospital emergency department.   If you develop a fever greater that 101 F, purulent drainage from wound, increased redness or drainage from wound, foul odor from the wound/dressing, or calf pain - CONTACT YOUR SURGEON.                                                   FOLLOW-UP APPOINTMENTS:  If you do not already have a post-op appointment, please call the office [phone] for an appointment to be seen by Dr. Percell Miller in 2 weeks.   OTHER INSTRUCTIONS:   MAKE SURE YOU:  Understand these instructions.  Get help right away if you are not doing well or get worse.    Thank you for letting us be a part of your medical care team.  It is a privilege we respect greatly.  We hope these instructions will help you stay on track for a fast and full recovery!   Do not put a pillow under the knee. Place it under the heel.   Complete by: As directed    Driving restrictions   Complete by: As directed    No driving for 2 weeks   Flush IV access with Sodium Chloride 0.9% and Heparin 10 units/ml or 100 units/ml   Complete by: As directed    Home infusion instructions - [facility]   Complete by: As directed    Instructions: Flush IV access with Sodium Chloride 0.9% and Heparin 10units/ml or 100units/ml   Change dressing on IV access line: Weekly and PRN   Instructions Cath Flo $Remove'2mg'usLsvAt$ : Administer for PICC Line occlusion and as ordered by physician for other access device   [facility] pharmacist to adjust dose for: Vancomycin, Aminoglycosides and other anti-infective therapies as requested by physician   Method of administration may be changed at the discretion of home infusion  pharmacist based upon assessment of the patient and/or caregivers ability to self-administer the medication ordered   Complete by: As directed    Post-operative opioid taper instructions:   Complete by: As directed    POST-OPERATIVE OPIOID TAPER INSTRUCTIONS: It is important to  wean off of your opioid medication as soon as possible. If you do not need pain medication after your surgery it is ok to stop day one. Opioids include: Codeine, Hydrocodone(Norco, Vicodin), Oxycodone(Percocet, oxycontin) and hydromorphone amongst others.  Long term and even short term use of opiods can cause: Increased pain response Dependence Constipation Depression Respiratory depression And more.  Withdrawal symptoms can include Flu like symptoms Nausea, vomiting And more Techniques to manage these symptoms Hydrate well Eat regular healthy meals Stay active Use relaxation techniques(deep breathing, meditating, yoga) Do Not substitute Alcohol to help with tapering If you have been on opioids for less than two weeks and do not have pain than it is ok to stop all together.  Plan to wean off of opioids This plan should start within one week post op of your joint replacement. Maintain the same interval or time between taking each dose and first decrease the dose.  Cut the total daily intake of opioids by one tablet each day Next start to increase the time between doses. The last dose that should be eliminated is the evening dose.      Weight bearing as tolerated   Complete by: As directed    In [place] for 3 days post-op   Laterality: right   Extremity: Lower      Allergies as of 03/24/2021   No Known Allergies      Medication List     STOP taking these medications    acetaminophen 650 MG CR tablet Commonly known as: TYLENOL Replaced by: acetaminophen 500 MG tablet   ibuprofen 200 MG tablet Commonly known as: ADVIL       TAKE these medications    acetaminophen 500 MG tablet Commonly known as: TYLENOL Take 2 tablets (1,000 mg total) by mouth every 6 (six) hours as needed for mild pain or moderate pain. Replaces: acetaminophen 650 MG CR tablet   atorvastatin 40 MG tablet Commonly known as: LIPITOR TAKE 1 TABLET BY MOUTH EVERY DAY AT 6PM   carvedilol 12.5 MG  tablet Commonly known as: COREG TAKE 1 TABLET BY MOUTH TWICE A DAY   cefTRIAXone  IVPB Commonly known as: ROCEPHIN Inject 2 g into the vein daily. Indication:  Infected R-TKR First Dose: Yes Last Day of Therapy:  05/03/21 Labs - Once weekly:  CBC/D and BMP, Labs - Every other week:  ESR and CRP Method of administration: IV Push Method of administration may be changed at the discretion of home infusion pharmacist based upon assessment of the patient and/or caregiver's ability to self-administer the medication ordered.   celecoxib 100 MG capsule Commonly known as: CeleBREX Take 1 capsule (100 mg total) by mouth 2 (two) times daily as needed for up to 10 days for mild pain or moderate pain (and inflammation).   cholecalciferol 25 MCG (1000 UNIT) tablet Commonly known as: VITAMIN D Take 1,000 Units by mouth daily.   Eliquis 5 MG Tabs tablet Generic drug: apixaban TAKE 1 TABLET BY MOUTH TWICE A DAY   lisinopril 40 MG tablet Commonly known as: ZESTRIL Take 1 tablet (40 mg total) by mouth daily. Please schedule yearly appointment for future refills. Thank you   methocarbamol 750 MG tablet Commonly known as: Robaxin-750 Take  1 tablet (750 mg total) by mouth every 8 (eight) hours as needed for muscle spasms.   ondansetron 4 MG disintegrating tablet Commonly known as: ZOFRAN-ODT Take 1 tablet (4 mg total) by mouth 2 (two) times daily as needed for nausea or vomiting.   oxyCODONE 5 MG immediate release tablet Commonly known as: Roxicodone Take 1 tablet (5 mg total) by mouth every 6 (six) hours as needed for severe pain. Do not take more than 6 tablets in a 24 hour period.               Discharge Care Instructions  (From admission, onward)           Start     Ordered   03/24/21 0000  Weight bearing as tolerated       Comments: In [place] for 3 days post-op  Question Answer Comment  Laterality right   Extremity Lower      03/24/21 0809   03/24/21 0000  Change dressing on  IV access line weekly and PRN  (Home infusion instructions - [facility] )        03/24/21 7209            Follow-up Information     [name], MD. Go on 03/30/2021.   Specialty: Orthopedic Surgery Why: at 4:15pm Contact information: [address] [place] [address] [phone]         [facility] Follow up.   Why: [facility] will provide your home IV antibiotics.        Care, [facility] Follow up.   Specialty: Home Health Services Why: Nursing to assist with labs and PICC line maintenance. Contact information: [place] [address] [phone]                 Signed: Britt Bottom PA-C 03/24/2021, 8:10 AM

## 2021-03-24 NOTE — Plan of Care (Signed)
  Problem: Activity: Goal: Risk for activity intolerance will decrease Outcome: Progressing   Problem: Safety: Goal: Ability to remain free from injury will improve Outcome: Progressing   

## 2021-03-24 NOTE — Progress Notes (Signed)
Subjective: Patient reports pain as increased. Block is wearing off. Trying not to move leg as much due to pain. Tolerating diet. Urinating. No CP, SOB.  Mobilized well OOB with PT yesterday.   Has a lot of questions about his PICC line, taking care of it, getting antibiotics at home, when the [facility] will come, etc.  Objective:   VITALS:   Vitals:   03/23/21 0929 03/23/21 1348 03/23/21 2159 03/24/21 0534  BP: 114/74 102/61 114/75 111/69  Pulse: 60 60 66 66  Resp: 18 18 15 17   Temp: (!) 97.5 F (36.4 C) (!) 97.3 F (36.3 C) (!) 97.4 F (36.3 C) 98.5 F (36.9 C)  TempSrc: Oral Oral Oral Oral  SpO2: 94% 95% 98% 95%  Weight:      Height:       CBC Latest Ref Rng & Units 03/22/2021 03/17/2021 03/01/2021  WBC 4.0 - 10.5 K/uL 9.3 10.4 -  Hemoglobin 13.0 - 17.0 g/dL 14/08/2020 96.0 45.4  Hematocrit 39.0 - 52.0 % 39.6 40.1 39.0  Platelets 150 - 400 K/uL 211 254 -   BMP Latest Ref Rng & Units 03/23/2021 03/22/2021 03/17/2021  Glucose 70 - 99 mg/dL 03/19/2021) 119(J) 478(G)  BUN 8 - 23 mg/dL 15 23 13   Creatinine 0.61 - 1.24 mg/dL 956(O 1.30  BUN/Creat Ratio 6 - 22 (calc) - - NOT APPLICABLE  Sodium 135 - 145 mmol/L 132(L) 133(L) 137  Potassium 3.5 - 5.1 mmol/L 4.5 4.3 5.0  Chloride 98 - 111 mmol/L 99 99 100  CO2 22 - 32 mmol/L 28 25 27   Calcium 8.9 - 10.3 mg/dL 8.65) 7.84) )   Intake/Output      12/28 0701 12/29 0700 12/29 0701 12/30 0700   P.O.     I.V. (mL/kg)     IV Piggyback     Total Intake(mL/kg)     Urine (mL/kg/hr) 1150 (0.4)    Blood     Total Output 1150    Net -1150            Physical Exam: General: NAD.  Laying in bed asleep. Easily awoken. Calm Resp: No increased wob Cardio: regular rate and rhythm ABD soft Neurologically intact MSK Neurovascularly intact Sensation intact distally Intact pulses distally Dorsiflexion/Plantar flexion intact Incision: dressing C/D/I KI in place  Assessment: 2 Days Post-Op  S/P Procedure(s) (LRB): TOTAL KNEE  ARTHROPLASTY WITH REVISION COMPONENTS (POLY EXCHANGE); I&D (Right) CYSTOSCOPY FLEXIBLE; ATTEMPTED FOLEY INSERTION (N/A) by Dr. 1/30. Murphy on 03/22/21  Principal Problem:   Infection of total right knee replacement (HCC)   Plan: IV ABX Awaiting specimen results  Advance diet Up with therapy Incentive Spirometry Elevate and Apply ice  Weightbearing: WBAT RLE Insicional and dressing care: Dressings left intact until follow-up and Reinforce dressings as needed Orthopedic device(s):  KI when mobilizing for 3 days Showering: Keep dressing dry VTE prophylaxis:  Eliquis 5mg  bid  , SCDs, ambulation Pain control: Tylenol, Oxycodone, Dilaudid PRN Follow - up plan: 2 weeks Contact information:  1/31 MD, Jewel Baize PA-C  Dispo: Home hopefully today. SW has set up orders for [facility] to help with his PICC line ABX. He has mobilized well with PT so from an orthopedic standpoint he is clear to d/c. Awaiting input from ID today. Will pend d/c orders until I hear from them.      Anticipated LOS equal to or greater than 2 midnights due to - Age 110 and older with one or more of the  following:  - Obesity  - Expected need for hospital services (PT, OT, Nursing) required for safe  discharge  - Anticipated need for postoperative skilled nursing care or inpatient rehab  - Active co-morbidities: infected right TKA OR   - Unanticipated findings during/Post Surgery: Infected knee joint or operative site  - Patient is a high risk of re-admission due to: PICC line ABX   Britt Bottom, PA-C Office [phone] 03/24/2021, 7:13 AM

## 2021-03-24 NOTE — TOC Transition Note (Signed)
Transition of Care [facility]) - CM/SW Discharge Note  Patient Details  Name: Harold Robertson MRN: [id number] Date of Birth: Jan 16, 1951  Transition of Care [facility]) CM/SW Contact:  Ewing Schlein, LCSW Phone Number: 03/24/2021, 12:49 PM  Clinical Narrative: OPAT orders signed. Pam with [facility] to complete teaching today. [facility] to provide [facility]. Patient is expected to discharge home today. TOC signing off.  Final next level of care: Home w Home Health Services Barriers to Discharge: Barriers Resolved  Patient Goals and CMS Choice Patient states their goals for this hospitalization and ongoing recovery are:: Return home with IV antibiotics CMS Medicare.gov Compare Post Acute Care list provided to:: Patient Choice offered to / list presented to : Patient  Discharge Plan and Services In-house Referral: Clinical Social Work Post Acute Care Choice: Home Health          DME Arranged: N/A DME Agency: NA HH Arranged: RN, IV Antibiotics HH Agency: [facility] Date [facility] Agency Contacted: 03/23/21 Representative spoke with at [facility] Agency: Pam  Readmission Risk Interventions No flowsheet data found.

## 2021-03-24 NOTE — Progress Notes (Signed)
PHARMACY CONSULT NOTE FOR:  OUTPATIENT  PARENTERAL ANTIBIOTIC THERAPY (OPAT)  Indication: Infected R-TKR Regimen: Rocephin 2g IV every 24 hours End date: 05/03/20  IV antibiotic discharge orders are pended. To discharging provider:  please sign these orders via discharge navigator,  Select New Orders & click on the button choice - Manage This Unsigned Work.    Thank you for allowing pharmacy to be a part of this patients care.  Georgina Pillion, PharmD, BCPS Clinical Pharmacist 03/24/2021 8:01 AM   **Pharmacist phone directory can now be found on [email or web address] ([email or web address]).  Listed under [facility].

## 2021-03-24 NOTE — Progress Notes (Signed)
Discharge package printed and instructions given to wife and pt. Verbalize understanding. 

## 2021-03-26 DIAGNOSIS — Z7901 Long term (current) use of anticoagulants: Secondary | ICD-10-CM | POA: Diagnosis not present

## 2021-03-26 DIAGNOSIS — I10 Essential (primary) hypertension: Secondary | ICD-10-CM | POA: Diagnosis not present

## 2021-03-26 DIAGNOSIS — Z9981 Dependence on supplemental oxygen: Secondary | ICD-10-CM | POA: Diagnosis not present

## 2021-03-26 DIAGNOSIS — I442 Atrioventricular block, complete: Secondary | ICD-10-CM | POA: Diagnosis not present

## 2021-03-26 DIAGNOSIS — T8453XA Infection and inflammatory reaction due to internal right knee prosthesis, initial encounter: Secondary | ICD-10-CM | POA: Diagnosis not present

## 2021-03-26 DIAGNOSIS — Z950 Presence of cardiac pacemaker: Secondary | ICD-10-CM | POA: Diagnosis not present

## 2021-03-26 DIAGNOSIS — Z9181 History of falling: Secondary | ICD-10-CM | POA: Diagnosis not present

## 2021-03-26 DIAGNOSIS — Z452 Encounter for adjustment and management of vascular access device: Secondary | ICD-10-CM | POA: Diagnosis not present

## 2021-03-26 DIAGNOSIS — F17220 Nicotine dependence, chewing tobacco, uncomplicated: Secondary | ICD-10-CM | POA: Diagnosis not present

## 2021-03-26 DIAGNOSIS — B951 Streptococcus, group B, as the cause of diseases classified elsewhere: Secondary | ICD-10-CM | POA: Diagnosis not present

## 2021-03-26 DIAGNOSIS — G4733 Obstructive sleep apnea (adult) (pediatric): Secondary | ICD-10-CM | POA: Diagnosis not present

## 2021-03-26 DIAGNOSIS — Z792 Long term (current) use of antibiotics: Secondary | ICD-10-CM | POA: Diagnosis not present

## 2021-03-26 DIAGNOSIS — M159 Polyosteoarthritis, unspecified: Secondary | ICD-10-CM | POA: Diagnosis not present

## 2021-03-28 LAB — AEROBIC/ANAEROBIC CULTURE W GRAM STAIN (SURGICAL/DEEP WOUND)

## 2021-03-29 DIAGNOSIS — M159 Polyosteoarthritis, unspecified: Secondary | ICD-10-CM | POA: Diagnosis not present

## 2021-03-29 DIAGNOSIS — B951 Streptococcus, group B, as the cause of diseases classified elsewhere: Secondary | ICD-10-CM | POA: Diagnosis not present

## 2021-03-29 DIAGNOSIS — I442 Atrioventricular block, complete: Secondary | ICD-10-CM | POA: Diagnosis not present

## 2021-03-29 DIAGNOSIS — I10 Essential (primary) hypertension: Secondary | ICD-10-CM | POA: Diagnosis not present

## 2021-03-29 DIAGNOSIS — T8453XA Infection and inflammatory reaction due to internal right knee prosthesis, initial encounter: Secondary | ICD-10-CM | POA: Diagnosis not present

## 2021-03-29 DIAGNOSIS — Z452 Encounter for adjustment and management of vascular access device: Secondary | ICD-10-CM | POA: Diagnosis not present

## 2021-03-30 DIAGNOSIS — M25561 Pain in right knee: Secondary | ICD-10-CM | POA: Diagnosis not present

## 2021-04-05 DIAGNOSIS — I10 Essential (primary) hypertension: Secondary | ICD-10-CM | POA: Diagnosis not present

## 2021-04-05 DIAGNOSIS — T8453XA Infection and inflammatory reaction due to internal right knee prosthesis, initial encounter: Secondary | ICD-10-CM | POA: Diagnosis not present

## 2021-04-05 DIAGNOSIS — Z452 Encounter for adjustment and management of vascular access device: Secondary | ICD-10-CM | POA: Diagnosis not present

## 2021-04-05 DIAGNOSIS — I442 Atrioventricular block, complete: Secondary | ICD-10-CM | POA: Diagnosis not present

## 2021-04-05 DIAGNOSIS — B951 Streptococcus, group B, as the cause of diseases classified elsewhere: Secondary | ICD-10-CM | POA: Diagnosis not present

## 2021-04-05 DIAGNOSIS — M159 Polyosteoarthritis, unspecified: Secondary | ICD-10-CM | POA: Diagnosis not present

## 2021-04-11 ENCOUNTER — Ambulatory Visit (INDEPENDENT_AMBULATORY_CARE_PROVIDER_SITE_OTHER): Payer: Medicare Other

## 2021-04-11 DIAGNOSIS — T8459XA Infection and inflammatory reaction due to other internal joint prosthesis, initial encounter: Secondary | ICD-10-CM | POA: Diagnosis not present

## 2021-04-11 DIAGNOSIS — I442 Atrioventricular block, complete: Secondary | ICD-10-CM

## 2021-04-11 LAB — CUP PACEART REMOTE DEVICE CHECK
Battery Remaining Longevity: 7 mo
Battery Remaining Percentage: 8 %
Battery Voltage: 2.83 V
Brady Statistic AP VP Percent: 86 %
Brady Statistic AP VS Percent: 1 %
Brady Statistic AS VP Percent: 14 %
Brady Statistic AS VS Percent: 1 %
Brady Statistic RA Percent Paced: 86 %
Date Time Interrogation Session: 20230116040013
Implantable Lead Implant Date: 20170615
Implantable Lead Implant Date: 20170615
Implantable Lead Implant Date: 20170615
Implantable Lead Location: 753858
Implantable Lead Location: 753859
Implantable Lead Location: 753860
Implantable Pulse Generator Implant Date: 20170615
Lead Channel Impedance Value: 440 Ohm
Lead Channel Impedance Value: 510 Ohm
Lead Channel Impedance Value: 810 Ohm
Lead Channel Pacing Threshold Amplitude: 0.75 V
Lead Channel Pacing Threshold Amplitude: 1.5 V
Lead Channel Pacing Threshold Amplitude: 1.5 V
Lead Channel Pacing Threshold Pulse Width: 0.4 ms
Lead Channel Pacing Threshold Pulse Width: 0.4 ms
Lead Channel Pacing Threshold Pulse Width: 0.8 ms
Lead Channel Sensing Intrinsic Amplitude: 3.7 mV
Lead Channel Sensing Intrinsic Amplitude: 8.3 mV
Lead Channel Setting Pacing Amplitude: 2 V
Lead Channel Setting Pacing Amplitude: 2.5 V
Lead Channel Setting Pacing Amplitude: 2.5 V
Lead Channel Setting Pacing Pulse Width: 0.4 ms
Lead Channel Setting Pacing Pulse Width: 0.8 ms
Lead Channel Setting Sensing Sensitivity: 5 mV
Pulse Gen Model: 3262
Pulse Gen Serial Number: 7894586

## 2021-04-12 DIAGNOSIS — M159 Polyosteoarthritis, unspecified: Secondary | ICD-10-CM | POA: Diagnosis not present

## 2021-04-12 DIAGNOSIS — I442 Atrioventricular block, complete: Secondary | ICD-10-CM | POA: Diagnosis not present

## 2021-04-12 DIAGNOSIS — I10 Essential (primary) hypertension: Secondary | ICD-10-CM | POA: Diagnosis not present

## 2021-04-12 DIAGNOSIS — B951 Streptococcus, group B, as the cause of diseases classified elsewhere: Secondary | ICD-10-CM | POA: Diagnosis not present

## 2021-04-12 DIAGNOSIS — T8453XA Infection and inflammatory reaction due to internal right knee prosthesis, initial encounter: Secondary | ICD-10-CM | POA: Diagnosis not present

## 2021-04-12 DIAGNOSIS — Z452 Encounter for adjustment and management of vascular access device: Secondary | ICD-10-CM | POA: Diagnosis not present

## 2021-04-13 ENCOUNTER — Other Ambulatory Visit: Payer: Self-pay

## 2021-04-13 ENCOUNTER — Ambulatory Visit (INDEPENDENT_AMBULATORY_CARE_PROVIDER_SITE_OTHER): Payer: Medicare Other | Admitting: Internal Medicine

## 2021-04-13 ENCOUNTER — Encounter: Payer: Self-pay | Admitting: Internal Medicine

## 2021-04-13 VITALS — BP 122/87 | HR 96 | Temp 97.3°F | Wt 274.0 lb

## 2021-04-13 DIAGNOSIS — T8453XD Infection and inflammatory reaction due to internal right knee prosthesis, subsequent encounter: Secondary | ICD-10-CM | POA: Diagnosis not present

## 2021-04-13 DIAGNOSIS — A491 Streptococcal infection, unspecified site: Secondary | ICD-10-CM | POA: Diagnosis not present

## 2021-04-13 DIAGNOSIS — Z95828 Presence of other vascular implants and grafts: Secondary | ICD-10-CM | POA: Insufficient documentation

## 2021-04-13 NOTE — Assessment & Plan Note (Signed)
Patient continues on ceftriaxone IV 2gm daily via PICC line for infected right TKA due to Group B strep.  He is s/p I&D and poly exchange on 12/27 and tolerating antibiotics thus far.  No issues with antibiotics or PICC line.  Will plan for IV antibiotics prior to conversion to PO therapy for period of suppression.  Duration to be determined.  Will transition to PO amoxicillin 500mg  TID at that time.  Follow up on 04/28/21.

## 2021-04-13 NOTE — Progress Notes (Signed)
[facility] for Infectious Disease  CHIEF COMPLAINT:    Follow up for PJI  SUBJECTIVE:    Harold Robertson is a 71 y.o. male with PMHx as below who presents to the clinic for PJI.   Patient presents today for routine follow up regarding infection of right knee TKA.    He was hospitalized at [facility] last month 12/27-12/29 and underwent I&D and poly exchange on 12/27 with orthopedics.   Cultures from clinic preoperatively grew GBS.  OR cultures grew the same organism.  His baseline ESR = 69 and CRP = 30 from OPAT labs on 04/05/21.  Patient had a PICC line placed and was discharged on ceftriaxone 2 gm daily for planned 6 weeks through 05/03/21.  He has been doing well at home.  No pain in knee right now and saw Dr Percell Miller for follow up x 1 since discharge who was pleased with his progress.   Please see A&P for the details of today's visit and status of the patient's medical problems.   Patient's Medications  New Prescriptions   No medications on file  Previous Medications   ACETAMINOPHEN (TYLENOL) 500 MG TABLET    Take 2 tablets (1,000 mg total) by mouth every 6 (six) hours as needed for mild pain or moderate pain.   ATORVASTATIN (LIPITOR) 40 MG TABLET    TAKE 1 TABLET BY MOUTH EVERY DAY AT 6PM   CARVEDILOL (COREG) 12.5 MG TABLET    TAKE 1 TABLET BY MOUTH TWICE A DAY   CEFTRIAXONE (ROCEPHIN) IVPB    Inject 2 g into the vein daily. Indication:  Infected R-TKR First Dose: Yes Last Day of Therapy:  05/03/21 Labs - Once weekly:  CBC/D and BMP, Labs - Every other week:  ESR and CRP Method of administration: IV Push Method of administration may be changed at the discretion of home infusion pharmacist based upon assessment of the patient and/or caregiver's ability to self-administer the medication ordered.   CHOLECALCIFEROL (VITAMIN D) 25 MCG (1000 UNIT) TABLET    Take 1,000 Units by mouth daily.   ELIQUIS 5 MG TABS TABLET    TAKE 1 TABLET BY MOUTH TWICE A DAY   LISINOPRIL  (ZESTRIL) 40 MG TABLET    Take 1 tablet (40 mg total) by mouth daily. Please schedule yearly appointment for future refills. Thank you   METHOCARBAMOL (ROBAXIN-750) 750 MG TABLET    Take 1 tablet (750 mg total) by mouth every 8 (eight) hours as needed for muscle spasms.   ONDANSETRON (ZOFRAN-ODT) 4 MG DISINTEGRATING TABLET    Take 1 tablet (4 mg total) by mouth 2 (two) times daily as needed for nausea or vomiting.   OXYCODONE (ROXICODONE) 5 MG IMMEDIATE RELEASE TABLET    Take 1 tablet (5 mg total) by mouth every 6 (six) hours as needed for severe pain. Do not take more than 6 tablets in a 24 hour period.  Modified Medications   No medications on file  Discontinued Medications   No medications on file      Past Medical History:  Diagnosis Date   Arthritis    Atrial fibrillation (HCC)    Complete heart block (HCC)    Hypertension    OSA on CPAP    Presence of permanent cardiac pacemaker     Social History   Tobacco Use   Smoking status: Former    Types: Cigars   Smokeless tobacco: Current    Types: Snuff  Vaping Use   Vaping Use: Never used  Substance Use Topics   Alcohol use: Yes    Alcohol/week: 1.0 standard drink    Types: 1 Cans of beer per week    Comment: occ   Drug use: No    Family History  Problem Relation Age of Onset   Heart disease Father 40       AMI   Liver cancer Mother    Cancer Mother 7       Colon cancer with liver mets   Heart disease Brother 30       AMI   Heart disease Maternal Grandmother     No Known Allergies  Review of Systems  Constitutional: Negative.   Respiratory: Negative.    Cardiovascular: Negative.   Gastrointestinal: Negative.   Musculoskeletal:  Negative for joint pain.    OBJECTIVE:    Vitals:   04/13/21 1452  BP: 122/87  Pulse: 96  Temp: (!) 97.3 F (36.3 C)  TempSrc: Oral  SpO2: 98%  Weight: 274 lb (124.3 kg)   Body mass index is 38.22 kg/m.  Physical Exam Constitutional:      General: He is not in acute  distress.    Appearance: Normal appearance.  HENT:     Head: Normocephalic and atraumatic.  Eyes:     Extraocular Movements: Extraocular movements intact.     Conjunctiva/sclera: Conjunctivae normal.  Pulmonary:     Effort: Pulmonary effort is normal. No respiratory distress.  Abdominal:     General: There is no distension.     Palpations: Abdomen is soft.  Musculoskeletal:     Comments: Right knee incision well healing without dehiscence. UE picc in place.   Neurological:     General: No focal deficit present.     Mental Status: He is alert and oriented to person, place, and time.  Psychiatric:        Mood and Affect: Mood normal.        Behavior: Behavior normal.     Labs and Microbiology: CBC Latest Ref Rng & Units 03/22/2021 03/17/2021 03/01/2021  WBC 4.0 - 10.5 K/uL 9.3 10.4 -  Hemoglobin 13.0 - 17.0 g/dL 13.0 13.2 13.3  Hematocrit 39.0 - 52.0 % 39.6 40.1 39.0  Platelets 150 - 400 K/uL 211 254 -   CMP Latest Ref Rng & Units 03/23/2021 03/22/2021 03/17/2021  Glucose 70 - 99 mg/dL 166(H) 190(H) 108(H)  BUN 8 - 23 mg/dL _0 Creatinine 0.61 - 1.24 mg/dL 0.67 1.00 0.93  Sodium 135 - 145 mmol/L 132(L) 133(L) 137  Potassium 3.5 - 5.1 mmol/L 4.5 4.3 5.0  Chloride 98 - 111 mmol/L 99 99 100  CO2 22 - 32 mmol/L _1 Calcium 8.9 - 10.3 mg/dL 8.6(L) 8.6(L) 8.5(L)  Total Protein 6.5 - 8.1 g/dL 7.0 7.1 6.5  Total Bilirubin 0.3 - 1.2 mg/dL 0.9 1.1 1.4(H)  Alkaline Phos 38 - 126 U/L 65 66 -  AST 15 - 41 U/L 35 49(H) 41(H)  ALT 0 - 44 U/L 62(H) 78(H) 80(H)      ASSESSMENT & PLAN:    Infection of total right knee replacement (HCC) Patient continues on ceftriaxone IV 2gm daily via PICC line for infected right TKA due to Group B strep.  He is s/p I&D and poly exchange on 12/27 and tolerating antibiotics thus far.  No issues with antibiotics or PICC line.  Will plan for IV antibiotics prior to conversion to PO therapy for period  of suppression.  Duration to be determined.   Will transition to PO amoxicillin 562m TID at that time.  Follow up on 04/28/21.      ARaynelle Highlandfor Infectious Disease [facility] 04/13/2021, 3:08 PM

## 2021-04-13 NOTE — Patient Instructions (Signed)
Thank you for coming to see me today. It was a pleasure seeing you.  To Do: Continue ceftriaxone via picc line for now Follow up on 04/28/21 and we will talk about oral antibiotics.  If you have any questions or concerns, please do not hesitate to call the office at [phone].  Take Care,   Gwynn Burly

## 2021-04-18 DIAGNOSIS — T8459XD Infection and inflammatory reaction due to other internal joint prosthesis, subsequent encounter: Secondary | ICD-10-CM | POA: Diagnosis not present

## 2021-04-19 ENCOUNTER — Encounter (HOSPITAL_COMMUNITY): Payer: Self-pay | Admitting: Radiology

## 2021-04-19 DIAGNOSIS — I442 Atrioventricular block, complete: Secondary | ICD-10-CM | POA: Diagnosis not present

## 2021-04-19 DIAGNOSIS — T8453XA Infection and inflammatory reaction due to internal right knee prosthesis, initial encounter: Secondary | ICD-10-CM | POA: Diagnosis not present

## 2021-04-19 DIAGNOSIS — B951 Streptococcus, group B, as the cause of diseases classified elsewhere: Secondary | ICD-10-CM | POA: Diagnosis not present

## 2021-04-19 DIAGNOSIS — I10 Essential (primary) hypertension: Secondary | ICD-10-CM | POA: Diagnosis not present

## 2021-04-19 DIAGNOSIS — Z452 Encounter for adjustment and management of vascular access device: Secondary | ICD-10-CM | POA: Diagnosis not present

## 2021-04-19 DIAGNOSIS — M159 Polyosteoarthritis, unspecified: Secondary | ICD-10-CM | POA: Diagnosis not present

## 2021-04-20 ENCOUNTER — Other Ambulatory Visit: Payer: Self-pay | Admitting: Cardiology

## 2021-04-21 NOTE — Progress Notes (Signed)
Remote pacemaker transmission.   

## 2021-04-25 DIAGNOSIS — Z9981 Dependence on supplemental oxygen: Secondary | ICD-10-CM | POA: Diagnosis not present

## 2021-04-25 DIAGNOSIS — Z452 Encounter for adjustment and management of vascular access device: Secondary | ICD-10-CM | POA: Diagnosis not present

## 2021-04-25 DIAGNOSIS — Z9181 History of falling: Secondary | ICD-10-CM | POA: Diagnosis not present

## 2021-04-25 DIAGNOSIS — Z792 Long term (current) use of antibiotics: Secondary | ICD-10-CM | POA: Diagnosis not present

## 2021-04-25 DIAGNOSIS — I442 Atrioventricular block, complete: Secondary | ICD-10-CM | POA: Diagnosis not present

## 2021-04-25 DIAGNOSIS — Z950 Presence of cardiac pacemaker: Secondary | ICD-10-CM | POA: Diagnosis not present

## 2021-04-25 DIAGNOSIS — B951 Streptococcus, group B, as the cause of diseases classified elsewhere: Secondary | ICD-10-CM | POA: Diagnosis not present

## 2021-04-25 DIAGNOSIS — I10 Essential (primary) hypertension: Secondary | ICD-10-CM | POA: Diagnosis not present

## 2021-04-25 DIAGNOSIS — M159 Polyosteoarthritis, unspecified: Secondary | ICD-10-CM | POA: Diagnosis not present

## 2021-04-25 DIAGNOSIS — T8453XA Infection and inflammatory reaction due to internal right knee prosthesis, initial encounter: Secondary | ICD-10-CM | POA: Diagnosis not present

## 2021-04-25 DIAGNOSIS — F17220 Nicotine dependence, chewing tobacco, uncomplicated: Secondary | ICD-10-CM | POA: Diagnosis not present

## 2021-04-25 DIAGNOSIS — G4733 Obstructive sleep apnea (adult) (pediatric): Secondary | ICD-10-CM | POA: Diagnosis not present

## 2021-04-25 DIAGNOSIS — Z7901 Long term (current) use of anticoagulants: Secondary | ICD-10-CM | POA: Diagnosis not present

## 2021-04-26 DIAGNOSIS — T8453XA Infection and inflammatory reaction due to internal right knee prosthesis, initial encounter: Secondary | ICD-10-CM | POA: Diagnosis not present

## 2021-04-26 DIAGNOSIS — B951 Streptococcus, group B, as the cause of diseases classified elsewhere: Secondary | ICD-10-CM | POA: Diagnosis not present

## 2021-04-26 DIAGNOSIS — I10 Essential (primary) hypertension: Secondary | ICD-10-CM | POA: Diagnosis not present

## 2021-04-26 DIAGNOSIS — M159 Polyosteoarthritis, unspecified: Secondary | ICD-10-CM | POA: Diagnosis not present

## 2021-04-26 DIAGNOSIS — Z452 Encounter for adjustment and management of vascular access device: Secondary | ICD-10-CM | POA: Diagnosis not present

## 2021-04-26 DIAGNOSIS — I442 Atrioventricular block, complete: Secondary | ICD-10-CM | POA: Diagnosis not present

## 2021-04-28 ENCOUNTER — Other Ambulatory Visit: Payer: Self-pay

## 2021-04-28 ENCOUNTER — Encounter: Payer: Self-pay | Admitting: Internal Medicine

## 2021-04-28 ENCOUNTER — Ambulatory Visit (INDEPENDENT_AMBULATORY_CARE_PROVIDER_SITE_OTHER): Payer: Medicare Other | Admitting: Internal Medicine

## 2021-04-28 ENCOUNTER — Telehealth: Payer: Self-pay

## 2021-04-28 VITALS — BP 148/89 | HR 77 | Temp 97.8°F | Resp 16 | Ht 71.0 in | Wt 286.0 lb

## 2021-04-28 DIAGNOSIS — T8453XD Infection and inflammatory reaction due to internal right knee prosthesis, subsequent encounter: Secondary | ICD-10-CM | POA: Diagnosis not present

## 2021-04-28 DIAGNOSIS — Z452 Encounter for adjustment and management of vascular access device: Secondary | ICD-10-CM | POA: Insufficient documentation

## 2021-04-28 DIAGNOSIS — A491 Streptococcal infection, unspecified site: Secondary | ICD-10-CM | POA: Diagnosis not present

## 2021-04-28 MED ORDER — AMOXICILLIN 500 MG PO CAPS: 500.0000 mg | ORAL_CAPSULE | Freq: Three times a day (TID) | ORAL | 2 refills | Status: DC

## 2021-04-28 NOTE — Patient Instructions (Signed)
Thank you for coming to see me today. It was a pleasure seeing you.  To Do: Continue antibiotics via picc line through Feb 7 then it will be removed On Feb 8 start taking amoxicillin 500mg  three times per day as prescribed Follow up with me in 6 weeks  If you have any questions or concerns, please do not hesitate to call the office at [phone].  Take Care,   [phone]

## 2021-04-28 NOTE — Telephone Encounter (Addendum)
Verbal orders given to [facility] with [facility] per Dr.Wallace - Pull PICC line after last dose of IV ceftriaxone on 05/03/2021. Lynn repeated verbal orders back to me and verbalized his understanding.  Lele, RN with [facility] also made aware of PICC line to be pulled on 05/03/2021.    Kaileia Flow Lesli Albee, CMA

## 2021-04-28 NOTE — Telephone Encounter (Signed)
Thank you :)

## 2021-04-28 NOTE — Assessment & Plan Note (Signed)
Patient has a right knee PJI due to Group B strep s/p DAIR procedure on 03/22/21.  He will complete 6 weeks of IV antibiotics on 05/03/21 at which time PICC line can be removed.  Will then transition him to oral amoxicillin 500mg  TID for continued antibiotics.  Duration TBD but anticipate at least 6 months of suppression.  Follow up in 6 weeks.

## 2021-04-28 NOTE — Progress Notes (Signed)
[place] for Infectious Disease  CHIEF COMPLAINT:    Follow up for prosthetic joint infection.  SUBJECTIVE:    Harold Robertson is a 71 y.o. male with PMHx as below who presents to the clinic for follow up of right knee PJI.   Patient is here today for scheduled follow up.  He was admitted at [facility] from 12/27-12/29/22.  He underwent I&D and poly exchange (DAIR) on 12/27 with orthopedics (Dr. Percell Miller).  Operative cultures grew GBS and he was started on IV Ceftriaxone via PICC line for 6 weeks planned through 05/03/21.  He continues on this antibiotic without issues and PICC line is working okay.  His OPAT labs from 04/19/21 have shown improvement in his CRP with normal value of 8 most recently.    He reports follow up with Dr Percell Miller last week and states he continued to be happy with his progress.  He is anticipating being on antibiotics for several months.   Please see A&P for the details of today's visit and status of the patient's medical problems.   Patient's Medications  New Prescriptions   AMOXICILLIN (AMOXIL) 500 MG CAPSULE    Take 1 capsule (500 mg total) by mouth 3 (three) times daily.  Previous Medications   ACETAMINOPHEN (TYLENOL) 500 MG TABLET    Take 2 tablets (1,000 mg total) by mouth every 6 (six) hours as needed for mild pain or moderate pain.   ATORVASTATIN (LIPITOR) 40 MG TABLET    TAKE 1 TABLET BY MOUTH EVERY DAY AT 6PM   CARVEDILOL (COREG) 12.5 MG TABLET    TAKE 1 TABLET BY MOUTH TWICE A DAY   CEFTRIAXONE (ROCEPHIN) IVPB    Inject 2 g into the vein daily. Indication:  Infected R-TKR First Dose: Yes Last Day of Therapy:  05/03/21 Labs - Once weekly:  CBC/D and BMP, Labs - Every other week:  ESR and CRP Method of administration: IV Push Method of administration may be changed at the discretion of home infusion pharmacist based upon assessment of the patient and/or caregiver's ability to self-administer the medication ordered.   CHOLECALCIFEROL  (VITAMIN D) 25 MCG (1000 UNIT) TABLET    Take 1,000 Units by mouth daily.   ELIQUIS 5 MG TABS TABLET    TAKE 1 TABLET BY MOUTH TWICE A DAY   LISINOPRIL (ZESTRIL) 40 MG TABLET    Take 1 tablet (40 mg total) by mouth daily.   METHOCARBAMOL (ROBAXIN-750) 750 MG TABLET    Take 1 tablet (750 mg total) by mouth every 8 (eight) hours as needed for muscle spasms.   ONDANSETRON (ZOFRAN-ODT) 4 MG DISINTEGRATING TABLET    Take 1 tablet (4 mg total) by mouth 2 (two) times daily as needed for nausea or vomiting.   OXYCODONE (ROXICODONE) 5 MG IMMEDIATE RELEASE TABLET    Take 1 tablet (5 mg total) by mouth every 6 (six) hours as needed for severe pain. Do not take more than 6 tablets in a 24 hour period.  Modified Medications   No medications on file  Discontinued Medications   No medications on file      Past Medical History:  Diagnosis Date   Arthritis    Atrial fibrillation (HCC)    Complete heart block (HCC)    Hypertension    OSA on CPAP    Presence of permanent cardiac pacemaker     Social History   Tobacco Use   Smoking status: Former  Types: Cigars   Smokeless tobacco: Current    Types: Snuff  Vaping Use   Vaping Use: Never used  Substance Use Topics   Alcohol use: Yes    Alcohol/week: 1.0 standard drink    Types: 1 Cans of beer per week    Comment: occ   Drug use: No    Family History  Problem Relation Age of Onset   Heart disease Father 43       AMI   Liver cancer Mother    Cancer Mother 5       Colon cancer with liver mets   Heart disease Brother 29       AMI   Heart disease Maternal Grandmother     No Known Allergies  Review of Systems  Constitutional: Negative.   Respiratory: Negative.    Cardiovascular: Negative.   Gastrointestinal: Negative.   Musculoskeletal:  Positive for joint pain.    OBJECTIVE:    Vitals:   04/28/21 1526  BP: (!) 148/89  Pulse: 77  Resp: 16  Temp: 97.8 F (36.6 C)  TempSrc: Oral  SpO2: 100%  Weight: 286 lb (129.7 kg)   Height: _0  (1.803 m)   Body mass index is 39.89 kg/m.  Physical Exam Constitutional:      Appearance: Normal appearance.  HENT:     Head: Normocephalic and atraumatic.  Eyes:     Extraocular Movements: Extraocular movements intact.     Conjunctiva/sclera: Conjunctivae normal.  Pulmonary:     Effort: Pulmonary effort is normal. No respiratory distress.  Skin:    General: Skin is warm and dry.  Neurological:     General: No focal deficit present.     Mental Status: He is alert and oriented to person, place, and time.  Psychiatric:        Mood and Affect: Mood normal.        Behavior: Behavior normal.     Labs and Microbiology: CBC Latest Ref Rng & Units 03/22/2021 03/17/2021 03/01/2021  WBC 4.0 - 10.5 K/uL 9.3 10.4 -  Hemoglobin 13.0 - 17.0 g/dL 13.0 13.2 13.3  Hematocrit 39.0 - 52.0 % 39.6 40.1 39.0  Platelets 150 - 400 K/uL 211 254 -   CMP Latest Ref Rng & Units 03/23/2021 03/22/2021 03/17/2021  Glucose 70 - 99 mg/dL 166(H) 190(H) 108(H)  BUN 8 - 23 mg/dL _1 Creatinine 0.61 - 1.24 mg/dL 0.67 1.00 0.93  Sodium 135 - 145 mmol/L 132(L) 133(L) 137  Potassium 3.5 - 5.1 mmol/L 4.5 4.3 5.0  Chloride 98 - 111 mmol/L 99 99 100  CO2 22 - 32 mmol/L _2 Calcium 8.9 - 10.3 mg/dL 8.6(L) 8.6(L) 8.5(L)  Total Protein 6.5 - 8.1 g/dL 7.0 7.1 6.5  Total Bilirubin 0.3 - 1.2 mg/dL 0.9 1.1 1.4(H)  Alkaline Phos 38 - 126 U/L 65 66 -  AST 15 - 41 U/L 35 49(H) 41(H)  ALT 0 - 44 U/L 62(H) 78(H) 80(H)      ASSESSMENT & PLAN:    Infection of total right knee replacement (HCC) Patient has a right knee PJI due to Group B strep s/p DAIR procedure on 03/22/21.  He will complete 6 weeks of IV antibiotics on 05/03/21 at which time PICC line can be removed.  Will then transition him to oral amoxicillin 567m TID for continued antibiotics.  Duration TBD but anticipate at least 6 months of suppression.  Follow up in 6 weeks.  PICC (peripherally inserted central catheter)  in  place No issues and will arrange for PICC removal after IV antibiotics on 05/03/21.      Raynelle Highland for Infectious Disease [facility] 04/28/2021, 3:43 PM

## 2021-04-28 NOTE — Assessment & Plan Note (Signed)
No issues and will arrange for PICC removal after IV antibiotics on 05/03/21.

## 2021-04-29 ENCOUNTER — Other Ambulatory Visit: Payer: Self-pay | Admitting: Cardiology

## 2021-05-03 DIAGNOSIS — B951 Streptococcus, group B, as the cause of diseases classified elsewhere: Secondary | ICD-10-CM | POA: Diagnosis not present

## 2021-05-03 DIAGNOSIS — I10 Essential (primary) hypertension: Secondary | ICD-10-CM | POA: Diagnosis not present

## 2021-05-03 DIAGNOSIS — T8453XA Infection and inflammatory reaction due to internal right knee prosthesis, initial encounter: Secondary | ICD-10-CM | POA: Diagnosis not present

## 2021-05-03 DIAGNOSIS — M159 Polyosteoarthritis, unspecified: Secondary | ICD-10-CM | POA: Diagnosis not present

## 2021-05-03 DIAGNOSIS — I442 Atrioventricular block, complete: Secondary | ICD-10-CM | POA: Diagnosis not present

## 2021-05-03 DIAGNOSIS — Z452 Encounter for adjustment and management of vascular access device: Secondary | ICD-10-CM | POA: Diagnosis not present

## 2021-05-12 ENCOUNTER — Ambulatory Visit (INDEPENDENT_AMBULATORY_CARE_PROVIDER_SITE_OTHER): Payer: Medicare Other

## 2021-05-12 DIAGNOSIS — I442 Atrioventricular block, complete: Secondary | ICD-10-CM

## 2021-05-13 LAB — CUP PACEART REMOTE DEVICE CHECK
Battery Remaining Longevity: 5 mo
Battery Remaining Percentage: 7 %
Battery Voltage: 2.81 V
Brady Statistic AP VP Percent: 85 %
Brady Statistic AP VS Percent: 1 %
Brady Statistic AS VP Percent: 14 %
Brady Statistic AS VS Percent: 1 %
Brady Statistic RA Percent Paced: 85 %
Date Time Interrogation Session: 20230217094730
Implantable Lead Implant Date: 20170615
Implantable Lead Implant Date: 20170615
Implantable Lead Implant Date: 20170615
Implantable Lead Location: 753858
Implantable Lead Location: 753859
Implantable Lead Location: 753860
Implantable Pulse Generator Implant Date: 20170615
Lead Channel Impedance Value: 430 Ohm
Lead Channel Impedance Value: 480 Ohm
Lead Channel Impedance Value: 800 Ohm
Lead Channel Pacing Threshold Amplitude: 0.625 V
Lead Channel Pacing Threshold Amplitude: 1 V
Lead Channel Pacing Threshold Amplitude: 1.625 V
Lead Channel Pacing Threshold Pulse Width: 0.4 ms
Lead Channel Pacing Threshold Pulse Width: 0.4 ms
Lead Channel Pacing Threshold Pulse Width: 0.8 ms
Lead Channel Sensing Intrinsic Amplitude: 12 mV
Lead Channel Sensing Intrinsic Amplitude: 5 mV
Lead Channel Setting Pacing Amplitude: 1.625
Lead Channel Setting Pacing Amplitude: 2 V
Lead Channel Setting Pacing Amplitude: 2.625
Lead Channel Setting Pacing Pulse Width: 0.4 ms
Lead Channel Setting Pacing Pulse Width: 0.8 ms
Lead Channel Setting Sensing Sensitivity: 5 mV
Pulse Gen Model: 3262
Pulse Gen Serial Number: 7894586

## 2021-05-17 NOTE — Progress Notes (Signed)
Remote pacemaker transmission.   

## 2021-05-17 NOTE — Addendum Note (Signed)
Addended by: Elease Etienne A on: 05/17/2021 03:56 PM   Modules accepted: Level of Service

## 2021-05-23 DIAGNOSIS — T8459XD Infection and inflammatory reaction due to other internal joint prosthesis, subsequent encounter: Secondary | ICD-10-CM | POA: Diagnosis not present

## 2021-06-02 ENCOUNTER — Ambulatory Visit (INDEPENDENT_AMBULATORY_CARE_PROVIDER_SITE_OTHER): Payer: Medicare Other | Admitting: Internal Medicine

## 2021-06-02 ENCOUNTER — Other Ambulatory Visit: Payer: Self-pay

## 2021-06-02 ENCOUNTER — Encounter: Payer: Self-pay | Admitting: Internal Medicine

## 2021-06-02 VITALS — BP 132/92 | HR 60 | Temp 97.7°F | Wt 289.0 lb

## 2021-06-02 DIAGNOSIS — Z452 Encounter for adjustment and management of vascular access device: Secondary | ICD-10-CM

## 2021-06-02 DIAGNOSIS — A491 Streptococcal infection, unspecified site: Secondary | ICD-10-CM | POA: Diagnosis not present

## 2021-06-02 DIAGNOSIS — Z5181 Encounter for therapeutic drug level monitoring: Secondary | ICD-10-CM

## 2021-06-02 DIAGNOSIS — T8453XD Infection and inflammatory reaction due to internal right knee prosthesis, subsequent encounter: Secondary | ICD-10-CM | POA: Diagnosis not present

## 2021-06-02 NOTE — Progress Notes (Signed)
?  ? ? ? ? ?[place] for Infectious Disease ? ?CHIEF COMPLAINT:   ? ?Follow up for PJI ? ?SUBJECTIVE:   ? ?Harold Robertson is a 71 y.o. male with PMHx as below who presents to the clinic for right knee PJI.  ? ?Patient presents today for follow up.  He was admitted at [facility] from 12/27-12/29/22.  He underwent I&D and poly exchange on 12/27 with Dr Percell Miller.  OR cultures grew GBS and he was started on ceftriaxone via PICC line. He completed 6 weeks of therapy on 05/03/21 and was then transitioned to oral amoxicillin 500mg  TID for continued antibiotics.  He reports that there have been no issues taking or tolerating amoxicillin.  He last saw Dr Percell Miller 04/18/21 and reports being pleased with his progress.  ? ?Please see A&P for the details of today's visit and status of the patient's medical problems.  ? ?Patient's Medications  ?New Prescriptions  ? No medications on file  ?Previous Medications  ? ACETAMINOPHEN (TYLENOL) 500 MG TABLET    Take 2 tablets (1,000 mg total) by mouth every 6 (six) hours as needed for mild pain or moderate pain.  ? AMOXICILLIN (AMOXIL) 500 MG CAPSULE    Take 1 capsule (500 mg total) by mouth 3 (three) times daily.  ? ATORVASTATIN (LIPITOR) 40 MG TABLET    TAKE 1 TABLET BY MOUTH EVERY DAY AT 6PM  ? CARVEDILOL (COREG) 12.5 MG TABLET    TAKE 1 TABLET BY MOUTH TWICE A DAY  ? CHOLECALCIFEROL (VITAMIN D) 25 MCG (1000 UNIT) TABLET    Take 1,000 Units by mouth daily.  ? ELIQUIS 5 MG TABS TABLET    TAKE 1 TABLET BY MOUTH TWICE A DAY  ? LISINOPRIL (ZESTRIL) 40 MG TABLET    Take 1 tablet (40 mg total) by mouth daily.  ? METHOCARBAMOL (ROBAXIN-750) 750 MG TABLET    Take 1 tablet (750 mg total) by mouth every 8 (eight) hours as needed for muscle spasms.  ? ONDANSETRON (ZOFRAN-ODT) 4 MG DISINTEGRATING TABLET    Take 1 tablet (4 mg total) by mouth 2 (two) times daily as needed for nausea or vomiting.  ? OXYCODONE (ROXICODONE) 5 MG IMMEDIATE RELEASE TABLET    Take 1 tablet (5 mg total) by mouth every 6  (six) hours as needed for severe pain. Do not take more than 6 tablets in a 24 hour period.  ?Modified Medications  ? No medications on file  ?Discontinued Medications  ? No medications on file  ?   ? ?Past Medical History:  ?Diagnosis Date  ? Arthritis   ? Atrial fibrillation ([place])   ? Complete heart block ([place])   ? Hypertension   ? OSA on CPAP   ? Presence of permanent cardiac pacemaker   ? ? ?Social History  ? ?Tobacco Use  ? Smoking status: Former  ?  Types: Cigars  ? Smokeless tobacco: Current  ?  Types: Snuff  ? Tobacco comments:  ?  occ  ?Vaping Use  ? Vaping Use: Never used  ?Substance Use Topics  ? Alcohol use: Yes  ?  Alcohol/week: 1.0 standard drink  ?  Types: 1 Cans of beer per week  ?  Comment: occ  ? Drug use: No  ? ? ?Family History  ?Problem Relation Age of Onset  ? Heart disease Father 80  ?     AMI  ? Liver cancer Mother   ? Cancer Mother 54  ?  Colon cancer with liver mets  ? Heart disease Brother 35  ?     AMI  ? Heart disease Maternal Grandmother   ? ? ?No Known Allergies ? ?Review of Systems  ?Constitutional: Negative.   ?Gastrointestinal: Negative.   ?Musculoskeletal:  Negative for joint pain.  ? ? ?OBJECTIVE:   ? ?Vitals:  ? 06/02/21 1047  ?BP: (!) 132/92  ?Pulse: 60  ?Temp: 97.7 ?F (36.5 ?C)  ?TempSrc: Oral  ?SpO2: 99%  ?Weight: 289 lb (131.1 kg)  ? ?Body mass index is 40.31 kg/m?. ? ?Physical Exam ?Constitutional:   ?   General: He is not in acute distress. ?   Appearance: Normal appearance.  ?HENT:  ?   Head: Normocephalic and atraumatic.  ?Eyes:  ?   Extraocular Movements: Extraocular movements intact.  ?   Conjunctiva/sclera: Conjunctivae normal.  ?Pulmonary:  ?   Effort: Pulmonary effort is normal. No respiratory distress.  ?Musculoskeletal:  ?   Comments: Right knee with well healed incision.  No warmth or erythema.   ?Skin: ?   General: Skin is warm and dry.  ?Neurological:  ?   General: No focal deficit present.  ?   Mental Status: He is alert and oriented to person, place, and  time.  ?Psychiatric:     ?   Mood and Affect: Mood normal.     ?   Behavior: Behavior normal.  ? ? ? ?Labs and Microbiology: ?CBC Latest Ref Rng & Units 03/22/2021 03/17/2021 03/01/2021  ?WBC 4.0 - 10.5 K/uL 9.3 10.4 -  ?Hemoglobin 13.0 - 17.0 g/dL 13.0 13.2 13.3  ?Hematocrit 39.0 - 52.0 % 39.6 40.1 39.0  ?Platelets 150 - 400 K/uL 211 254 -  ? ?CMP Latest Ref Rng & Units 03/23/2021 03/22/2021 03/17/2021  ?Glucose 70 - 99 mg/dL 166(H) 190(H) 108(H)  ?BUN 8 - 23 mg/dL 15 23 13   ?Creatinine 0.61 - 1.24 mg/dL 0.67 1.00 0.93  ?Sodium 135 - 145 mmol/L 132(L) 133(L) 137  ?Potassium 3.5 - 5.1 mmol/L 4.5 4.3 5.0  ?Chloride 98 - 111 mmol/L 99 99 100  ?CO2 22 - 32 mmol/L 28 25 27   ?Calcium 8.9 - 10.3 mg/dL 8.6(L) 8.6(L) 8.5(L)  ?Total Protein 6.5 - 8.1 g/dL 7.0 7.1 6.5  ?Total Bilirubin 0.3 - 1.2 mg/dL 0.9 1.1 1.4(H)  ?Alkaline Phos 38 - 126 U/L 65 66 -  ?AST 15 - 41 U/L 35 49(H) 41(H)  ?ALT 0 - 44 U/L 62(H) 78(H) 80(H)  ?  ? ? ? ? ?ASSESSMENT & PLAN:   ? ?PICC (peripherally inserted central catheter) in place ?This has been removed.  ? ?Infection of total right knee replacement ([place]) ?Patient with right knee PJI due to GBS s/p DAIR 03/22/21.  Completed 6 weeks of IV antibiotics on 05/03/21 and transitioned to amoxicillin suppression 500mg  TID since that time.  He is doing well on this with no concerns and knee is feeling well.  Will continue with amoxicillin for now and follow up in 2 months.  Labs today. ? ?Encounter for therapeutic drug monitoring ?Check BMP, CBC today while on long term antibiotics.  ? ? ?Orders Placed This Encounter  ?Procedures  ? Basic metabolic panel  ?  Order Specific Question:   Has the patient fasted?  ?  Answer:   No  ? CBC  ?  ? ? ?Mignon Pine ?[place] for Infectious Disease ?[facility] ?06/02/2021, 11:18 AM ? ? ? ?

## 2021-06-02 NOTE — Assessment & Plan Note (Signed)
Check BMP, CBC today while on long term antibiotics.  ?

## 2021-06-02 NOTE — Assessment & Plan Note (Signed)
This has been removed.

## 2021-06-02 NOTE — Patient Instructions (Signed)
Thank you for coming to see me today. It was a pleasure seeing you. ? ?To Do: ?Labs today ?Continue amoxicillin as is for now ?Follow up in 2 months ? ?If you have any questions or concerns, please do not hesitate to call the office at [phone]. ? ?Take Care,  ? ?Gwynn Burly, DO ? ?

## 2021-06-02 NOTE — Assessment & Plan Note (Signed)
Patient with right knee PJI due to GBS s/p DAIR 03/22/21.  Completed 6 weeks of IV antibiotics on 05/03/21 and transitioned to amoxicillin suppression 500mg  TID since that time.  He is doing well on this with no concerns and knee is feeling well.  Will continue with amoxicillin for now and follow up in 2 months.  Labs today. ?

## 2021-06-03 LAB — CBC
HCT: 43.7 % (ref 38.5–50.0)
Hemoglobin: 14.5 g/dL (ref 13.2–17.1)
MCH: 28.3 pg (ref 27.0–33.0)
MCHC: 33.2 g/dL (ref 32.0–36.0)
MCV: 85.2 fL (ref 80.0–100.0)
MPV: 10.8 fL (ref 7.5–12.5)
Platelets: 175 10*3/uL (ref 140–400)
RBC: 5.13 10*6/uL (ref 4.20–5.80)
RDW: 14.1 % (ref 11.0–15.0)
WBC: 4.9 10*3/uL (ref 3.8–10.8)

## 2021-06-03 LAB — BASIC METABOLIC PANEL
BUN: 9 mg/dL (ref 7–25)
CO2: 26 mmol/L (ref 20–32)
Calcium: 9.4 mg/dL (ref 8.6–10.3)
Chloride: 104 mmol/L (ref 98–110)
Creat: 0.89 mg/dL (ref 0.70–1.35)
Glucose, Bld: 114 mg/dL — ABNORMAL HIGH (ref 65–99)
Potassium: 4.4 mmol/L (ref 3.5–5.3)
Sodium: 139 mmol/L (ref 135–146)

## 2021-06-13 ENCOUNTER — Ambulatory Visit (INDEPENDENT_AMBULATORY_CARE_PROVIDER_SITE_OTHER): Payer: Medicare Other

## 2021-06-13 DIAGNOSIS — I442 Atrioventricular block, complete: Secondary | ICD-10-CM

## 2021-06-13 LAB — CUP PACEART REMOTE DEVICE CHECK
Battery Remaining Longevity: 4 mo
Battery Remaining Percentage: 6 %
Battery Voltage: 2.8 V
Brady Statistic AP VP Percent: 86 %
Brady Statistic AP VS Percent: 1 %
Brady Statistic AS VP Percent: 14 %
Brady Statistic AS VS Percent: 1 %
Brady Statistic RA Percent Paced: 86 %
Date Time Interrogation Session: 20230320020013
Implantable Lead Implant Date: 20170615
Implantable Lead Implant Date: 20170615
Implantable Lead Implant Date: 20170615
Implantable Lead Location: 753858
Implantable Lead Location: 753859
Implantable Lead Location: 753860
Implantable Pulse Generator Implant Date: 20170615
Lead Channel Impedance Value: 430 Ohm
Lead Channel Impedance Value: 440 Ohm
Lead Channel Impedance Value: 830 Ohm
Lead Channel Pacing Threshold Amplitude: 0.625 V
Lead Channel Pacing Threshold Amplitude: 0.875 V
Lead Channel Pacing Threshold Amplitude: 1.5 V
Lead Channel Pacing Threshold Pulse Width: 0.4 ms
Lead Channel Pacing Threshold Pulse Width: 0.4 ms
Lead Channel Pacing Threshold Pulse Width: 0.8 ms
Lead Channel Sensing Intrinsic Amplitude: 12 mV
Lead Channel Sensing Intrinsic Amplitude: 4.4 mV
Lead Channel Setting Pacing Amplitude: 1.625
Lead Channel Setting Pacing Amplitude: 2 V
Lead Channel Setting Pacing Amplitude: 2.5 V
Lead Channel Setting Pacing Pulse Width: 0.4 ms
Lead Channel Setting Pacing Pulse Width: 0.8 ms
Lead Channel Setting Sensing Sensitivity: 5 mV
Pulse Gen Model: 3262
Pulse Gen Serial Number: 7894586

## 2021-06-22 NOTE — Progress Notes (Signed)
Remote pacemaker transmission.   

## 2021-06-27 ENCOUNTER — Telehealth: Payer: Self-pay | Admitting: Cardiology

## 2021-06-27 NOTE — Telephone Encounter (Signed)
Patient called and wants to know if he need to change his battery. Please call back ?

## 2021-06-27 NOTE — Telephone Encounter (Signed)
LVM for patient to call device back, patient has 4.2 months until elective replacement interval on his pacemaker as of 06/13/21, next remote transmission scheduled 07/14/21  ?

## 2021-06-27 NOTE — Telephone Encounter (Signed)
Spoke with patient informed him of battery longevity which is about 4 months until elective replacement interval and then once ERI was reached we had 3 months to get device changed out. Patient voiced understanding. Patient was concerned about battery life and scheduled apt with WC after speaking with patient, patient stated it was ok to cancel the apt with Dr.Camnitz.  ?

## 2021-07-01 NOTE — Telephone Encounter (Signed)
[place], can you get this patient r/s to see camnitz. ?Pt will need a gen change this year. ?Not sure why device clinic cancelled appt w/ Camnitz as he has not seen pt since 02/2020 (seen by Keitha Butte 02/2021). ? ?Thanks ? ?

## 2021-07-14 ENCOUNTER — Ambulatory Visit (INDEPENDENT_AMBULATORY_CARE_PROVIDER_SITE_OTHER): Payer: Medicare Other

## 2021-07-14 DIAGNOSIS — I442 Atrioventricular block, complete: Secondary | ICD-10-CM

## 2021-07-15 LAB — CUP PACEART REMOTE DEVICE CHECK
Battery Remaining Longevity: 4 mo
Battery Remaining Percentage: 6 %
Battery Voltage: 2.78 V
Brady Statistic AP VP Percent: 87 %
Brady Statistic AP VS Percent: 1 %
Brady Statistic AS VP Percent: 13 %
Brady Statistic AS VS Percent: 1 %
Brady Statistic RA Percent Paced: 87 %
Date Time Interrogation Session: 20230421094007
Implantable Lead Implant Date: 20170615
Implantable Lead Implant Date: 20170615
Implantable Lead Implant Date: 20170615
Implantable Lead Location: 753858
Implantable Lead Location: 753859
Implantable Lead Location: 753860
Implantable Pulse Generator Implant Date: 20170615
Lead Channel Impedance Value: 430 Ohm
Lead Channel Impedance Value: 440 Ohm
Lead Channel Impedance Value: 810 Ohm
Lead Channel Pacing Threshold Amplitude: 0.625 V
Lead Channel Pacing Threshold Amplitude: 1 V
Lead Channel Pacing Threshold Amplitude: 1.875 V
Lead Channel Pacing Threshold Pulse Width: 0.4 ms
Lead Channel Pacing Threshold Pulse Width: 0.4 ms
Lead Channel Pacing Threshold Pulse Width: 0.8 ms
Lead Channel Sensing Intrinsic Amplitude: 12 mV
Lead Channel Sensing Intrinsic Amplitude: 4.4 mV
Lead Channel Setting Pacing Amplitude: 1.625
Lead Channel Setting Pacing Amplitude: 2 V
Lead Channel Setting Pacing Amplitude: 2.875
Lead Channel Setting Pacing Pulse Width: 0.4 ms
Lead Channel Setting Pacing Pulse Width: 0.8 ms
Lead Channel Setting Sensing Sensitivity: 5 mV
Pulse Gen Model: 3262
Pulse Gen Serial Number: 7894586

## 2021-08-01 NOTE — Progress Notes (Signed)
Remote pacemaker transmission.   

## 2021-08-01 NOTE — Addendum Note (Signed)
Addended by: Cheri Kearns A on: 08/01/2021 12:15 PM ? ? Modules accepted: Level of Service ? ?

## 2021-08-02 ENCOUNTER — Other Ambulatory Visit: Payer: Self-pay

## 2021-08-02 ENCOUNTER — Ambulatory Visit (INDEPENDENT_AMBULATORY_CARE_PROVIDER_SITE_OTHER): Payer: Medicare Other | Admitting: Internal Medicine

## 2021-08-02 ENCOUNTER — Encounter: Payer: Self-pay | Admitting: Internal Medicine

## 2021-08-02 VITALS — BP 143/92 | HR 73 | Temp 98.1°F | Resp 16 | Ht 71.0 in | Wt 296.8 lb

## 2021-08-02 DIAGNOSIS — A491 Streptococcal infection, unspecified site: Secondary | ICD-10-CM | POA: Diagnosis not present

## 2021-08-02 DIAGNOSIS — T8453XD Infection and inflammatory reaction due to internal right knee prosthesis, subsequent encounter: Secondary | ICD-10-CM | POA: Diagnosis not present

## 2021-08-02 DIAGNOSIS — Z5181 Encounter for therapeutic drug level monitoring: Secondary | ICD-10-CM | POA: Diagnosis not present

## 2021-08-02 MED ORDER — AMOXICILLIN 500 MG PO CAPS: 500.0000 mg | ORAL_CAPSULE | Freq: Three times a day (TID) | ORAL | 2 refills | Status: DC

## 2021-08-02 NOTE — Patient Instructions (Signed)
Thank you for coming to see me today. It was a pleasure seeing you. ? ?To Do: ?Labs today ?Continue amoxicillin 500mg  three times per day for now ?Refills sent to pharmacy ?Follow up 3 months ? ?If you have any questions or concerns, please do not hesitate to call the office at [phone]. ? ?Take Care,  ? ?Jule Ser ? ?

## 2021-08-02 NOTE — Progress Notes (Signed)
?  ? ? ? ? ?[facility] ? ?CHIEF COMPLAINT:   ? ?Follow up for PJI ? ?SUBJECTIVE:   ? ?Harold Robertson is a 71 y.o. male with PMHx as below who presents to the clinic for PJI.  ? ?Patient presents today for routine follow up for right knee PJI.  He was admitted at [facility] from 12/27-12/29/22.  Status post I&D and poly exchange on 12/27 with Dr Eulah Pont.  OR cultures grew GBS and he was treated with ceftriaxone via PICC line x 6 weeks (end date 05/03/21).  He was then transitioned to amoxicillin 500mg  TID PO.  He has tolerated this antibiotic regimen without side effects.  He is doing well today without any recent fevers, chills, n/v/d, worsening knee pain. ? ?Please see A&P for the details of today's visit and status of the patient's medical problems.  ? ?Patient's Medications  ?New Prescriptions  ? No medications on file  ?Previous Medications  ? ACETAMINOPHEN (TYLENOL) 500 MG TABLET    Take 2 tablets (1,000 mg total) by mouth every 6 (six) hours as needed for mild pain or moderate pain.  ? ATORVASTATIN (LIPITOR) 40 MG TABLET    TAKE 1 TABLET BY MOUTH EVERY DAY AT 6PM  ? CARVEDILOL (COREG) 12.5 MG TABLET    TAKE 1 TABLET BY MOUTH TWICE A DAY  ? CHOLECALCIFEROL (VITAMIN D) 25 MCG (1000 UNIT) TABLET    Take 1,000 Units by mouth daily.  ? ELIQUIS 5 MG TABS TABLET    TAKE 1 TABLET BY MOUTH TWICE A DAY  ? LISINOPRIL (ZESTRIL) 40 MG TABLET    Take 1 tablet (40 mg total) by mouth daily.  ? METHOCARBAMOL (ROBAXIN-750) 750 MG TABLET    Take 1 tablet (750 mg total) by mouth every 8 (eight) hours as needed for muscle spasms.  ? ONDANSETRON (ZOFRAN-ODT) 4 MG DISINTEGRATING TABLET    Take 1 tablet (4 mg total) by mouth 2 (two) times daily as needed for nausea or vomiting.  ? OXYCODONE (ROXICODONE) 5 MG IMMEDIATE RELEASE TABLET    Take 1 tablet (5 mg total) by mouth every 6 (six) hours as needed for severe pain. Do not take more than 6 tablets in a 24 hour period.  ?Modified Medications  ? Modified  Medication Previous Medication  ? AMOXICILLIN (AMOXIL) 500 MG CAPSULE amoxicillin (AMOXIL) 500 MG capsule  ?    Take 1 capsule (500 mg total) by mouth 3 (three) times daily.    Take 1 capsule (500 mg total) by mouth 3 (three) times daily.  ?Discontinued Medications  ? No medications on file  ?   ? ?Past Medical History:  ?Diagnosis Date  ? Arthritis   ? Atrial fibrillation (HCC)   ? Complete heart block (HCC)   ? Hypertension   ? OSA on CPAP   ? Presence of permanent cardiac pacemaker   ? ? ?Social History  ? ?Tobacco Use  ? Smoking status: Former  ?  Types: Cigars  ? Smokeless tobacco: Current  ?  Types: Snuff  ? Tobacco comments:  ?  occ  ?Vaping Use  ? Vaping Use: Never used  ?Substance Use Topics  ? Alcohol use: Yes  ?  Alcohol/week: 1.0 standard drink  ?  Types: 1 Cans of beer per week  ?  Comment: occ  ? Drug use: No  ? ? ?Family History  ?Problem Relation Age of Onset  ? Heart disease Father 42  ?  AMI  ? Liver cancer Mother   ? Cancer Mother 21  ?     Colon cancer with liver mets  ? Heart disease Brother 91  ?     AMI  ? Heart disease Maternal Grandmother   ? ? ?No Known Allergies ? ?Review of Systems  ?All other systems reviewed and are negative. Except as noted above.  ? ? ?OBJECTIVE:   ? ?Vitals:  ? 08/02/21 0832  ?BP: (!) 143/92  ?Pulse: 73  ?Resp: 16  ?Temp: 98.1 ?F (36.7 ?C)  ?TempSrc: Oral  ?SpO2: 96%  ?Weight: 296 lb 12.8 oz (134.6 kg)  ?Height: 5\' 11"  (1.803 m)  ? ?Body mass index is 41.4 kg/m?. ? ?Physical Exam ?Constitutional:   ?   Appearance: Normal appearance.  ?HENT:  ?   Head: Normocephalic and atraumatic.  ?Eyes:  ?   Extraocular Movements: Extraocular movements intact.  ?   Conjunctiva/sclera: Conjunctivae normal.  ?Pulmonary:  ?   Effort: Pulmonary effort is normal. No respiratory distress.  ?Abdominal:  ?   General: There is no distension.  ?   Palpations: Abdomen is soft.  ?Musculoskeletal:  ?   Comments: Right knee incision well healed.  No warmth, erythema.  ?Skin: ?   General: Skin  is warm and dry.  ?   Findings: No rash.  ?Neurological:  ?   General: No focal deficit present.  ?   Mental Status: He is alert and oriented to person, place, and time.  ?Psychiatric:     ?   Mood and Affect: Mood normal.     ?   Behavior: Behavior normal.  ? ? ? ?Labs and Microbiology: ? ?  Latest Ref Rng & Units 06/02/2021  ? 11:28 AM 03/22/2021  ?  7:17 PM 03/17/2021  ? 11:41 AM  ?CBC  ?WBC 3.8 - 10.8 Thousand/uL 4.9   9.3   10.4    ?Hemoglobin 13.2 - 17.1 g/dL 14.5   13.0   13.2    ?Hematocrit 38.5 - 50.0 % 43.7   39.6   40.1    ?Platelets 140 - 400 Thousand/uL 175   211   254    ? ? ?  Latest Ref Rng & Units 06/02/2021  ? 11:28 AM 03/23/2021  ?  8:48 AM 03/22/2021  ?  7:17 PM  ?CMP  ?Glucose 65 - 99 mg/dL 114   166   190    ?BUN 7 - 25 mg/dL 9   15   23     ?Creatinine 0.70 - 1.35 mg/dL 0.89   0.67   1.00    ?Sodium 135 - 146 mmol/L 139   132   133    ?Potassium 3.5 - 5.3 mmol/L 4.4   4.5   4.3    ?Chloride 98 - 110 mmol/L 104   99   99    ?CO2 20 - 32 mmol/L 26   28   25     ?Calcium 8.6 - 10.3 mg/dL 9.4   8.6   8.6    ?Total Protein 6.5 - 8.1 g/dL  7.0   7.1    ?Total Bilirubin 0.3 - 1.2 mg/dL  0.9   1.1    ?Alkaline Phos 38 - 126 U/L  65   66    ?AST 15 - 41 U/L  35   49    ?ALT 0 - 44 U/L  62   78    ?  ? ? ? ?ASSESSMENT & PLAN:   ? ?  Encounter for therapeutic drug monitoring ?Check BMP and CBC today while on long term antibiotics.  ? ?Infection of total right knee replacement ([place]) ?Patient with 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  Completed 6 weeks of IV antibiotics on 05/03/21 and transitioned to amoxicillin suppression 500mg  TID since that time. He will complete approximately 5 months of abx later this month.  He is doing well and has no concerns today with continued improvement of his knee pain after initial infection. Discussed with patient there is no definitive test of cure to determine when all infection has been eradicated other than to stop therapy and observe.  Discussed risk vs  benefits and what would be associated with relapsed infection such as another surgery.  Will continue antibiotics for now and check labs today.  RTC 3 months.  ? ? ?Orders Placed This Encounter  ?Procedures  ? CBC  ? Basic metabolic panel  ?  Order Specific Question:   Has the patient fasted?  ?  Answer:   No  ? Sedimentation rate  ? C-reactive protein  ?  ? ? ? ? ? ?Mignon Pine ?[facility] ?[facility] ?08/02/2021, 9:01 AM ? ? ? ?

## 2021-08-02 NOTE — Assessment & Plan Note (Signed)
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  Completed 6 weeks of IV antibiotics on 05/03/21 and transitioned to amoxicillin suppression 500mg  TID since that time. He will complete approximately 5 months of abx later this month.  He is doing well and has no concerns today with continued improvement of his knee pain after initial infection. Discussed with patient there is no definitive test of cure to determine when all infection has been eradicated other than to stop therapy and observe.  Discussed risk vs benefits and what would be associated with relapsed infection such as another surgery.  Will continue antibiotics for now and check labs today.  RTC 3 months.  ?

## 2021-08-02 NOTE — Assessment & Plan Note (Signed)
Check BMP and CBC today while on long term antibiotics.  ?

## 2021-08-03 LAB — CBC
HCT: 44.8 % (ref 38.5–50.0)
Hemoglobin: 15.2 g/dL (ref 13.2–17.1)
MCH: 28.6 pg (ref 27.0–33.0)
MCHC: 33.9 g/dL (ref 32.0–36.0)
MCV: 84.4 fL (ref 80.0–100.0)
MPV: 10.3 fL (ref 7.5–12.5)
Platelets: 151 10*3/uL (ref 140–400)
RBC: 5.31 10*6/uL (ref 4.20–5.80)
RDW: 13.6 % (ref 11.0–15.0)
WBC: 5.6 10*3/uL (ref 3.8–10.8)

## 2021-08-03 LAB — SEDIMENTATION RATE: Sed Rate: 6 mm/h (ref 0–20)

## 2021-08-03 LAB — BASIC METABOLIC PANEL
BUN: 10 mg/dL (ref 7–25)
CO2: 26 mmol/L (ref 20–32)
Calcium: 9 mg/dL (ref 8.6–10.3)
Chloride: 109 mmol/L (ref 98–110)
Creat: 0.98 mg/dL (ref 0.70–1.28)
Glucose, Bld: 113 mg/dL — ABNORMAL HIGH (ref 65–99)
Potassium: 4.1 mmol/L (ref 3.5–5.3)
Sodium: 142 mmol/L (ref 135–146)

## 2021-08-03 LAB — C-REACTIVE PROTEIN: CRP: 2.4 mg/L (ref <8.0)

## 2021-08-08 ENCOUNTER — Encounter: Payer: Medicare Other | Admitting: Cardiology

## 2021-08-15 ENCOUNTER — Ambulatory Visit (INDEPENDENT_AMBULATORY_CARE_PROVIDER_SITE_OTHER): Payer: Medicare Other

## 2021-08-15 DIAGNOSIS — I442 Atrioventricular block, complete: Secondary | ICD-10-CM

## 2021-08-16 LAB — CUP PACEART REMOTE DEVICE CHECK
Battery Remaining Longevity: 3 mo
Battery Remaining Percentage: 4 %
Battery Voltage: 2.75 V
Brady Statistic AP VP Percent: 87 %
Brady Statistic AP VS Percent: 1 %
Brady Statistic AS VP Percent: 13 %
Brady Statistic AS VS Percent: 1 %
Brady Statistic RA Percent Paced: 87 %
Date Time Interrogation Session: 20230523142007
Implantable Lead Implant Date: 20170615
Implantable Lead Implant Date: 20170615
Implantable Lead Implant Date: 20170615
Implantable Lead Location: 753858
Implantable Lead Location: 753859
Implantable Lead Location: 753860
Implantable Pulse Generator Implant Date: 20170615
Lead Channel Impedance Value: 490 Ohm
Lead Channel Impedance Value: 510 Ohm
Lead Channel Impedance Value: 940 Ohm
Lead Channel Pacing Threshold Amplitude: 0.5 V
Lead Channel Pacing Threshold Amplitude: 1 V
Lead Channel Pacing Threshold Amplitude: 1.5 V
Lead Channel Pacing Threshold Pulse Width: 0.4 ms
Lead Channel Pacing Threshold Pulse Width: 0.4 ms
Lead Channel Pacing Threshold Pulse Width: 0.8 ms
Lead Channel Sensing Intrinsic Amplitude: 12 mV
Lead Channel Sensing Intrinsic Amplitude: 5 mV
Lead Channel Setting Pacing Amplitude: 1.5 V
Lead Channel Setting Pacing Amplitude: 2 V
Lead Channel Setting Pacing Amplitude: 2.5 V
Lead Channel Setting Pacing Pulse Width: 0.4 ms
Lead Channel Setting Pacing Pulse Width: 0.8 ms
Lead Channel Setting Sensing Sensitivity: 5 mV
Pulse Gen Model: 3262
Pulse Gen Serial Number: 7894586

## 2021-09-01 NOTE — Progress Notes (Signed)
Remote pacemaker transmission.   

## 2021-09-01 NOTE — Addendum Note (Signed)
Addended by: Geralyn Flash D on: 09/01/2021 11:23 AM   Modules accepted: Level of Service

## 2021-09-05 ENCOUNTER — Encounter: Payer: Self-pay | Admitting: Cardiology

## 2021-09-05 ENCOUNTER — Ambulatory Visit (INDEPENDENT_AMBULATORY_CARE_PROVIDER_SITE_OTHER): Payer: Medicare Other | Admitting: Cardiology

## 2021-09-05 VITALS — BP 140/90 | HR 60 | Ht 71.0 in | Wt 303.0 lb

## 2021-09-05 DIAGNOSIS — G4733 Obstructive sleep apnea (adult) (pediatric): Secondary | ICD-10-CM | POA: Diagnosis not present

## 2021-09-05 DIAGNOSIS — I480 Paroxysmal atrial fibrillation: Secondary | ICD-10-CM | POA: Diagnosis not present

## 2021-09-05 DIAGNOSIS — I442 Atrioventricular block, complete: Secondary | ICD-10-CM

## 2021-09-05 NOTE — Patient Instructions (Signed)
Medication Instructions:  Your physician recommends that you continue on your current medications as directed. Please refer to the Current Medication list given to you today.  *If you need a refill on your cardiac medications before your next appointment, please call your pharmacy*   Lab Work: None ordered   Testing/Procedures: None ordered   Follow-Up: At [facility], you and your health needs are our priority.  As part of our continuing mission to provide you with exceptional heart care, we have created designated Provider Care Teams.  These Care Teams include your primary Cardiologist (physician) and Advanced Practice Providers (APPs -  Physician Assistants and Nurse Practitioners) who all work together to provide you with the care you need, when you need it.  Remote monitoring is used to monitor your Pacemaker or ICD from home. This monitoring reduces the number of office visits required to check your device to one time per year. It allows [place] to keep an eye on the functioning of your device to ensure it is working properly. You are scheduled for a device check from home on 09/15/2021. You may send your transmission at any time that day. If you have a wireless device, the transmission will be sent automatically. After your physician reviews your transmission, you will receive a postcard with your next transmission date.  You have been referred to Dr. Radford Pax for your sleep apnea   Your next appointment:   To   be determined  The format for your next appointment:   In Person  Provider:   Allegra Lai, MD    Thank you for choosing [facility]!!   Trinidad Curet, RN [phone]    Other Instructions  Important Information About [place]

## 2021-09-05 NOTE — Progress Notes (Signed)
Electrophysiology Office Note   Date:  09/05/2021   ID:  Harold Robertson, DOB Jan 31, 1951, MRN [id number]  PCP:  [name], MD  Cardiologist:  [name] Primary Electrophysiologist:  Donyea Gafford [name], MD    No chief complaint on file.    History of Present Illness: Harold Robertson is a 71 y.o. male who presents today for electrophysiology evaluation.     He has a history significant hypertension, obesity, obstructive sleep apnea on CPAP.  He was hospitalized with complete heart block.  He is status post [place] CRT-P implanted 09/19/2015.  His ejection fraction at that time was 40 to 45%.  He was diagnosed with atrial fibrillation after device alert.  He was started on Eliquis and had a cardioversion 01/28/2020.  Today, denies symptoms of palpitations, chest pain, shortness of breath, orthopnea, PND, lower extremity edema, claudication, dizziness, presyncope, syncope, bleeding, or neurologic sequela. The patient is tolerating medications without difficulties.  He is currently feeling well.  He has no chest pain or shortness of breath.  He has been compliant with his CPAP.  His CPAP machine has reached end-of-life.  We Azha Constantin refer him to sleep medicine.  Aside from that, his pacemaker has 3.4 months left on the battery.    Past Medical History:  Diagnosis Date   Arthritis    Atrial fibrillation ([place])    Complete heart block ([place])    Hypertension    OSA on CPAP    Presence of permanent cardiac pacemaker    Past Surgical History:  Procedure Laterality Date   APPENDECTOMY  1960s   CARDIAC CATHETERIZATION Right 09/08/2015   Procedure: Temporary Pacemaker;  Surgeon: Brandonlee Navis [name], MD;  Location: [place] [facility];  Service: Cardiovascular;  Laterality: Right;   CARDIAC CATHETERIZATION N/A 09/09/2015   Procedure: Left Heart Cath and Coronary Angiography;  Surgeon: [name], MD;  Location: [place] [facility];  Service: Cardiovascular;  Laterality: N/A;    CARDIOVERSION N/A 01/28/2020   Procedure: CARDIOVERSION;  Surgeon: [name], MD;  Location: [facility];  Service: Cardiovascular;  Laterality: N/A;   CHEILECTOMY Left 03/15/2018   Procedure: CHEILECTOMY LEFT FOOT;  Surgeon: [name], MD;  Location: [place];  Service: Orthopedics;  Laterality: Left;   COLONOSCOPY     CYSTOSCOPY N/A 03/22/2021   Procedure: CYSTOSCOPY FLEXIBLE; ATTEMPTED FOLEY INSERTION;  Surgeon: [name], MD;  Location: [facility];  Service: Orthopedics;  Laterality: N/A;   EP IMPLANTABLE DEVICE N/A 09/09/2015   Procedure: BiV Pacemaker Insertion CRT-P;  Surgeon: Jonda Alanis [name], MD;  Location: [place] [facility];  Service: Cardiovascular;  Laterality: N/A;   KNEE SURGERY Right    TOTAL KNEE ARTHROPLASTY Right 07/11/2016   TOTAL KNEE ARTHROPLASTY Right 07/11/2016   Procedure: TOTAL KNEE ARTHROPLASTY;  Surgeon: [name], MD;  Location: [place];  Service: Orthopedics;  Laterality: Right;   TOTAL KNEE ARTHROPLASTY WITH REVISION COMPONENTS Right 03/22/2021   Procedure: TOTAL KNEE ARTHROPLASTY WITH REVISION COMPONENTS (POLY EXCHANGE); I&D;  Surgeon: [name], MD;  Location: [facility];  Service: Orthopedics;  Laterality: Right;     Current Outpatient Medications  Medication Sig Dispense Refill   acetaminophen (TYLENOL) 500 MG tablet Take 2 tablets (1,000 mg total) by mouth every 6 (six) hours as needed for mild pain or moderate pain. 60 tablet 0   amoxicillin (AMOXIL) 500 MG capsule Take 1 capsule (500 mg total) by mouth 3 (three) times daily. 90 capsule  2   atorvastatin (LIPITOR) 40 MG tablet TAKE 1 TABLET BY MOUTH EVERY DAY AT 6PM 90 tablet 3   carvedilol (COREG) 12.5 MG tablet TAKE 1 TABLET BY MOUTH TWICE A DAY 180 tablet 3   cholecalciferol (VITAMIN D) 25 MCG (1000 UNIT) tablet Take 1,000 Units by mouth daily.     ELIQUIS 5 MG TABS tablet TAKE 1 TABLET BY MOUTH TWICE A DAY 180 tablet 1   methocarbamol (ROBAXIN-750) 750 MG  tablet Take 1 tablet (750 mg total) by mouth every 8 (eight) hours as needed for muscle spasms. 20 tablet 0   No current facility-administered medications for this visit.    Allergies:   Patient has no known allergies.   Social History:  The patient  reports that he has quit smoking. His smoking use included cigars. His smokeless tobacco use includes snuff. He reports current alcohol use of about 1.0 standard drink of alcohol per week. He reports that he does not use drugs.   Family History:  The patient's family history includes Cancer (age of onset: 57) in his mother; Heart disease in his maternal grandmother; Heart disease (age of onset: 87) in his brother; Heart disease (age of onset: 54) in his father; Liver cancer in his mother.   ROS:  Please see the history of present illness.   Otherwise, review of systems is positive for none.   All other systems are reviewed and negative.   PHYSICAL EXAM: VS:  BP 140/90 (BP Location: Left Arm, Patient Position: Sitting, Cuff Size: Large)   Pulse 60   Ht 5\' 11"  (1.803 m)   Wt (!) 303 lb (137.4 kg)   SpO2 94%   BMI 42.26 kg/m  , BMI Body mass index is 42.26 kg/m. GEN: Well nourished, well developed, in no acute distress  HEENT: normal  Neck: no JVD, carotid bruits, or masses Cardiac: RRR; no murmurs, rubs, or gallops,no edema  Respiratory:  clear to auscultation bilaterally, normal work of breathing GI: soft, nontender, nondistended, + BS MS: no deformity or atrophy  Skin: warm and dry, device site well healed Neuro:  Strength and sensation are intact Psych: euthymic mood, full affect  EKG:  EKG is ordered today. Personal review of the ekg ordered shows AV paced  Personal review of the device interrogation today. Results in [place]: 03/23/2021: ALT 62 08/02/2021: BUN 10; Creat 0.98; Hemoglobin 15.2; Platelets 151; Potassium 4.1; Sodium 142    Lipid Panel     Component Value Date/Time   CHOL 92 03/17/2021 1141   TRIG  73 03/17/2021 1141   HDL 26 (L) 03/17/2021 1141   CHOLHDL 3.5 03/17/2021 1141   VLDL 18 11/16/2016 0950   LDLCALC 51 03/17/2021 1141     Wt Readings from Last 3 Encounters:  09/05/21 (!) 303 lb (137.4 kg)  08/02/21 296 lb 12.8 oz (134.6 kg)  06/02/21 289 lb (131.1 kg)      Other studies Reviewed: Additional studies/ records that were reviewed today include: Cardiac cath 09/09/15  Review of the above records today demonstrates:  Prox RCA lesion, 20% stenosed. Prox LAD to Mid LAD lesion, 40% stenosed. Mid Cx lesion, 20% stenosed. Ost 2nd Mrg to 2nd Mrg lesion, 20% stenosed. 1st Diag lesion, 30% stenosed. There is mild to moderate left ventricular systolic dysfunction.   1. Nonobstructive CAD 2. Mild to moderate LV dysfunction. EF 40-45%.  TTE 02/01/2018 - Left ventricle: Abnormal septal motion. The cavity size was    normal. Wall  thickness was increased in a pattern of mild LVH.    Systolic function was normal. The estimated ejection fraction was    in the range of 55% to 60%. Wall motion was normal; there were no    regional wall motion abnormalities. Doppler parameters are    consistent with abnormal left ventricular relaxation (grade 1    diastolic dysfunction).  - Left atrium: The atrium was mildly dilated.  - Atrial septum: No defect or patent foramen ovale was identified.   ASSESSMENT AND PLAN:  1.  Complete heart block: Status post [facility] Jude CRT-P.  Battery has reached ERI.  We Tais Koestner plan for generator change.  Risk and benefits were discussed risk of bleeding and infection.  He understands these risks and is agreed to the procedure.  We Mahasin Riviere schedule the procedure once he is ERI.  2.  Nonobstructive coronary artery disease: No current chest pain.  Continue with current management.  3.  Hypertension: Mildly elevated but well controlled.  4.  Hyperlipidemia: Continue atorvastatin per primary cardiology  5.  Paroxysmal atrial fibrillation/flutter: Status post  cardioversion 01/28/2020.  Currently on Eliquis.  CHA2DS2-VASc of 4.  6.  Obstructive sleep apnea: CPAP compliance encouraged.  Patryck Kilgore refer to sleep medicine for further management.  Current medicines are reviewed at length with the patient today.   The patient does not have concerns regarding his medicines.  The following changes were made today: none  Labs/ tests ordered today include:  Orders Placed This Encounter  Procedures   Ambulatory referral to Cardiology   EKG 12-Lead     Disposition:   FU with Marieelena Bartko 12 months  Signed, Lexianna Weinrich [name], MD  09/05/2021 2:40 PM     [place]  [address] [phone] (office) [phone] (fax)

## 2021-09-06 ENCOUNTER — Telehealth: Payer: Self-pay | Admitting: *Deleted

## 2021-09-06 DIAGNOSIS — I2510 Atherosclerotic heart disease of native coronary artery without angina pectoris: Secondary | ICD-10-CM

## 2021-09-06 DIAGNOSIS — I442 Atrioventricular block, complete: Secondary | ICD-10-CM

## 2021-09-06 DIAGNOSIS — I10 Essential (primary) hypertension: Secondary | ICD-10-CM

## 2021-09-06 DIAGNOSIS — G4733 Obstructive sleep apnea (adult) (pediatric): Secondary | ICD-10-CM

## 2021-09-06 NOTE — Telephone Encounter (Signed)
This patient needs Robertson sleep doctor, he uses his machine every night but he needs Robertson new cpap machine because it's battery life is ending. I am looking for his sleep study and his download is below. Does he just need an appointment with you now?  Harold Robertson, Harold Robertson 06/07/2021 - 09/04/2021 Patient ID: [id number] DOB: 12-27-1950 Age: 71 years 30.5 [place] (Therapist) [address] [place], [address] Compliance Report Usage 06/07/2021 - 09/04/2021 Usage days 90/90 days (100%) >= 4 hours 88 days (98%) < 4 hours 2 days (2%) Usage hours 648 hours 11 minutes Average usage (total days) 7 hours 12 minutes Average usage (days used) 7 hours 12 minutes Median usage (days used) 7 hours 6 minutes S9 AutoSet Serial number [id number] Mode CPAP Set pressure 7 cmH2O EPR Fulltime EPR level Off Therapy Leaks - L/min Median: 0.0 95th percentile: 5.7 Maximum: 23.5 Events per hour AI: 0.7 HI: 1.4 AHI: 2.1 Apnea Index Central: 0.0 Obstructive: 0.7 Unknown: 0.0 Usage - hours Printed on 09/06/2021 - ResMed AirView version 4.41.0-9.0 Page 1 of 1

## 2021-09-09 NOTE — Telephone Encounter (Signed)
Left detailed message on voicemail and informed patient to call back. 

## 2021-09-14 LAB — CUP PACEART REMOTE DEVICE CHECK
Battery Remaining Longevity: 1 mo
Battery Remaining Percentage: 3 %
Battery Voltage: 2.72 V
Brady Statistic AP VP Percent: 87 %
Brady Statistic AP VS Percent: 1 %
Brady Statistic AS VP Percent: 12 %
Brady Statistic AS VS Percent: 1 %
Brady Statistic RA Percent Paced: 87 %
Date Time Interrogation Session: 20230619040015
Implantable Lead Implant Date: 20170615
Implantable Lead Implant Date: 20170615
Implantable Lead Implant Date: 20170615
Implantable Lead Location: 753858
Implantable Lead Location: 753859
Implantable Lead Location: 753860
Implantable Pulse Generator Implant Date: 20170615
Lead Channel Impedance Value: 440 Ohm
Lead Channel Impedance Value: 460 Ohm
Lead Channel Impedance Value: 850 Ohm
Lead Channel Pacing Threshold Amplitude: 0.5 V
Lead Channel Pacing Threshold Amplitude: 1 V
Lead Channel Pacing Threshold Amplitude: 1.875 V
Lead Channel Pacing Threshold Pulse Width: 0.4 ms
Lead Channel Pacing Threshold Pulse Width: 0.4 ms
Lead Channel Pacing Threshold Pulse Width: 0.8 ms
Lead Channel Sensing Intrinsic Amplitude: 10.2 mV
Lead Channel Sensing Intrinsic Amplitude: 3.3 mV
Lead Channel Setting Pacing Amplitude: 1.5 V
Lead Channel Setting Pacing Amplitude: 2 V
Lead Channel Setting Pacing Amplitude: 2.875
Lead Channel Setting Pacing Pulse Width: 0.4 ms
Lead Channel Setting Pacing Pulse Width: 0.8 ms
Lead Channel Setting Sensing Sensitivity: 5 mV
Pulse Gen Model: 3262
Pulse Gen Serial Number: 7894586

## 2021-09-15 ENCOUNTER — Ambulatory Visit (INDEPENDENT_AMBULATORY_CARE_PROVIDER_SITE_OTHER): Payer: Medicare Other

## 2021-09-15 DIAGNOSIS — I428 Other cardiomyopathies: Secondary | ICD-10-CM

## 2021-09-15 NOTE — Telephone Encounter (Signed)
Patient has an appointment with his PCP and will notify our office after his appointment to have his new cpap ordered.

## 2021-09-22 NOTE — Progress Notes (Signed)
Remote pacemaker transmission.   

## 2021-09-23 ENCOUNTER — Ambulatory Visit (INDEPENDENT_AMBULATORY_CARE_PROVIDER_SITE_OTHER): Payer: Medicare Other | Admitting: Family Medicine

## 2021-09-23 VITALS — BP 142/86 | HR 63 | Temp 97.8°F | Ht 71.0 in | Wt 303.0 lb

## 2021-09-23 DIAGNOSIS — G4733 Obstructive sleep apnea (adult) (pediatric): Secondary | ICD-10-CM

## 2021-09-23 DIAGNOSIS — Z9989 Dependence on other enabling machines and devices: Secondary | ICD-10-CM | POA: Diagnosis not present

## 2021-09-23 DIAGNOSIS — E7800 Pure hypercholesterolemia, unspecified: Secondary | ICD-10-CM

## 2021-09-23 NOTE — Progress Notes (Signed)
Subjective:    Patient ID: Harold Robertson, male    DOB: November 13, 1950, 71 y.o.   MRN: [id number]  HPI  Patient is here today to try to get approved for a new CPAP machine.  He states that he was originally diagnosed with obstructive sleep apnea in 2011.  His CPAP machine was replaced in 2016.  He has been on his current CPAP machine therefore for 7 to 8 years.  His current CPAP machine is giving him a warning stating that is about to malfunction.  Therefore he is trying to get approved for a new CPAP machine.  He brings in his compliance report.  Over the last 90 days, he reports 100% compliance.  88 days out of 90 he used the CPAP machine for more than 4 hours.  Total usage for 648 hours with an average use of more than 7 hours/day.  His apnea hypopnea index was 2.1.  His set pressure of 7 cm.  Patient benefits from wearing the machine.  He states without machine he feels extremely tired.  With the machine he feels rested.  He would like [place] to fax an order to the DME to try to cover a new CPAP machine.  He provided [place] the contact information.  His most recent lab work in May showed mild elevations in liver function test, prediabetic level blood sugar, 113, and he has stage I hypertension at 142/86.  The patient has a BMI of 42 and weighs over 300 pounds Immunization History  Administered Date(s) Administered   Fluad Quad(high Dose 65+) 12/04/2018   Influenza Split 01/31/2013, 03/17/2015   Influenza, High Dose Seasonal PF 12/18/2017   Influenza,inj,Quad PF,6+ Mos 01/14/2016, 11/16/2016   Influenza-Unspecified 01/02/2014   PFIZER(Purple Top)SARS-COV-2 Vaccination 04/15/2019, 05/06/2019   Pneumococcal Conjugate-13 11/04/2018   Pneumococcal Polysaccharide-23 01/28/2018   Tdap 01/14/2016, 05/11/2016    Past Medical History:  Diagnosis Date   Arthritis    Atrial fibrillation (HCC)    Complete heart block (HCC)    Hypertension    OSA on CPAP    Presence of permanent cardiac pacemaker    Past  Surgical History:  Procedure Laterality Date   APPENDECTOMY  1960s   CARDIAC CATHETERIZATION Right 09/08/2015   Procedure: Temporary Pacemaker;  Surgeon: Will Jorja Loa, MD;  Location: [facility];  Service: Cardiovascular;  Laterality: Right;   CARDIAC CATHETERIZATION N/A 09/09/2015   Procedure: Left Heart Cath and Coronary Angiography;  Surgeon: Peter M Swaziland, MD;  Location: [facility] INVASIVE CV LAB;  Service: Cardiovascular;  Laterality: N/A;   CARDIOVERSION N/A 01/28/2020   Procedure: CARDIOVERSION;  Surgeon: Jake Bathe, MD;  Location: [facility] ENDOSCOPY;  Service: Cardiovascular;  Laterality: N/A;   CHEILECTOMY Left 03/15/2018   Procedure: CHEILECTOMY LEFT FOOT;  Surgeon: Sheral Apley, MD;  Location: [facility];  Service: Orthopedics;  Laterality: Left;   COLONOSCOPY     CYSTOSCOPY N/A 03/22/2021   Procedure: CYSTOSCOPY FLEXIBLE; ATTEMPTED FOLEY INSERTION;  Surgeon: Sheral Apley, MD;  Location: [facility];  Service: Orthopedics;  Laterality: N/A;   EP IMPLANTABLE DEVICE N/A 09/09/2015   Procedure: BiV Pacemaker Insertion CRT-P;  Surgeon: Will Jorja Loa, MD;  Location: [facility] CV LAB;  Service: Cardiovascular;  Laterality: N/A;   KNEE SURGERY Right    TOTAL KNEE ARTHROPLASTY Right 07/11/2016   TOTAL KNEE ARTHROPLASTY Right 07/11/2016   Procedure: TOTAL KNEE ARTHROPLASTY;  Surgeon: Sheral Apley, MD;  Location: [facility] OR;  Service: Orthopedics;  Laterality:  Right;   TOTAL KNEE ARTHROPLASTY WITH REVISION COMPONENTS Right 03/22/2021   Procedure: TOTAL KNEE ARTHROPLASTY WITH REVISION COMPONENTS (POLY EXCHANGE); I&D;  Surgeon: Sheral Apley, MD;  Location: [facility];  Service: Orthopedics;  Laterality: Right;   Current Outpatient Medications on File Prior to Visit  Medication Sig Dispense Refill   acetaminophen (TYLENOL) 500 MG tablet Take 2 tablets (1,000 mg total) by mouth every 6 (six) hours as needed for mild pain or moderate pain. 60 tablet 0   atorvastatin  (LIPITOR) 40 MG tablet TAKE 1 TABLET BY MOUTH EVERY DAY AT 6PM 90 tablet 3   carvedilol (COREG) 12.5 MG tablet TAKE 1 TABLET BY MOUTH TWICE A DAY 180 tablet 3   cholecalciferol (VITAMIN D) 25 MCG (1000 UNIT) tablet Take 1,000 Units by mouth daily.     ELIQUIS 5 MG TABS tablet TAKE 1 TABLET BY MOUTH TWICE A DAY 180 tablet 1   lisinopril (ZESTRIL) 40 MG tablet Take 40 mg by mouth daily. Dr. Marja Kays     methocarbamol (ROBAXIN-750) 750 MG tablet Take 1 tablet (750 mg total) by mouth every 8 (eight) hours as needed for muscle spasms. 20 tablet 0   No current facility-administered medications on file prior to visit.   No Known Allergies Social History   Socioeconomic History   Marital status: Married    Spouse name: Not on file   Number of children: Not on file   Years of education: Not on file   Highest education level: Not on file  Occupational History   Occupation: truck driver  Tobacco Use   Smoking status: Former    Types: Cigars   Smokeless tobacco: Current    Types: Snuff   Tobacco comments:    occ  Vaping Use   Vaping Use: Never used  Substance and Sexual Activity   Alcohol use: Yes    Alcohol/week: 1.0 standard drink of alcohol    Types: 1 Cans of beer per week    Comment: occ   Drug use: No   Sexual activity: Yes  Other Topics Concern   Not on file  Social History Narrative   Marital status: Married x 30 years; first marriage     Children: 2 children; no grandchildren     Lives: lives with wife      Employment: truck driver x 14 years; [place]        Tobacco:  None; pipes and cigars; chews tobacco.       Alcohol:  Weekends.      Drugs:  None      Exercise:  Rarely.      Seatbelt:  100%      Guns:  Loaded and secured.   Social Determinants of Health   Financial Resource Strain: Not on file  Food Insecurity: Not on file  Transportation Needs: Not on file  Physical Activity: Not on file  Stress: Not on file  Social Connections: Not on file  Intimate  Partner Violence: Not on file   Family History  Problem Relation Age of Onset   Heart disease Father 74       AMI   Liver cancer Mother    Cancer Mother 60       Colon cancer with liver mets   Heart disease Brother 66       AMI   Heart disease Maternal Grandmother       Review of Systems  All other systems reviewed and are negative.  Objective:   Physical Exam Vitals reviewed.  Constitutional:      General: He is not in acute distress.    Appearance: Normal appearance. He is well-developed. He is obese. He is not ill-appearing, toxic-appearing or diaphoretic.  HENT:     Head: Normocephalic and atraumatic.     Right Ear: Tympanic membrane, ear canal and external ear normal.     Left Ear: Tympanic membrane, ear canal and external ear normal.     Nose: Nose normal. No congestion or rhinorrhea.     Mouth/Throat:     Pharynx: Oropharynx is clear. No oropharyngeal exudate or posterior oropharyngeal erythema.  Eyes:     General: No scleral icterus.       Right eye: No discharge.        Left eye: No discharge.     Extraocular Movements: Extraocular movements intact.     Conjunctiva/sclera: Conjunctivae normal.     Pupils: Pupils are equal, round, and reactive to light.  Neck:     Thyroid: No thyromegaly.     Vascular: No carotid bruit.  Cardiovascular:     Rate and Rhythm: Normal rate and regular rhythm.     Pulses: Normal pulses.     Heart sounds: Normal heart sounds. No murmur heard.    No friction rub. No gallop.  Pulmonary:     Effort: Pulmonary effort is normal. No respiratory distress.     Breath sounds: Normal breath sounds. No stridor. No wheezing, rhonchi or rales.  Chest:     Chest wall: No tenderness.  Abdominal:     General: Bowel sounds are normal. There is no distension.     Palpations: Abdomen is soft. There is no mass.     Tenderness: There is no abdominal tenderness. There is no guarding or rebound.     Hernia: No hernia is present.   Musculoskeletal:        General: No deformity.     Cervical back: Normal range of motion and neck supple. No rigidity. No muscular tenderness.     Right lower leg: No edema.     Left lower leg: No edema.  Lymphadenopathy:     Cervical: No cervical adenopathy.  Skin:    Coloration: Skin is not jaundiced.     Findings: No bruising, erythema, lesion or rash.  Neurological:     General: No focal deficit present.     Mental Status: He is alert and oriented to person, place, and time.     Cranial Nerves: No cranial nerve deficit.     Sensory: No sensory deficit.     Motor: No weakness.     Coordination: Coordination normal.     Gait: Gait normal.     Deep Tendon Reflexes: Reflexes normal.  Psychiatric:        Mood and Affect: Mood normal.        Behavior: Behavior normal.        Thought Content: Thought content normal.        Judgment: Judgment normal.           Assessment & Plan:  OSA on CPAP  Pure hypercholesterolemia I explained to the patient that I believe he has fatty liver disease, prediabetes, and hypertension.  He needs to focus on diet exercise and weight loss.  I recommend a low carbohydrate diet, restricting his calories to less than 1200 cal/day and trying to exercise aerobically on a stationary bike 30 minutes a day.  I recommended that he  gradually build up to this level.  I will also send in an order for CPAP machine set to 7 cm of water.  His compliance report proves that he is compliant with therapy.

## 2021-09-28 ENCOUNTER — Telehealth: Payer: Self-pay

## 2021-09-28 ENCOUNTER — Other Ambulatory Visit: Payer: Self-pay

## 2021-09-28 DIAGNOSIS — G4733 Obstructive sleep apnea (adult) (pediatric): Secondary | ICD-10-CM

## 2021-09-28 NOTE — Addendum Note (Signed)
Addended by: Arta Silence on: 09/28/2021 10:37 AM   Modules accepted: Orders

## 2021-09-28 NOTE — Progress Notes (Signed)
pts cardiologist/Sleep Dr. will order pt's cpap and follow pt's care

## 2021-09-28 NOTE — Telephone Encounter (Signed)
  Spoke w/Nina Dr. Lambert Mody Turner-pt's cardiologist/Sleep Dr. Today. Per Coralee North for me to not send the order for pt's CPAP because she was going to send the order in for Dr. Mayford Knife, who will continue to follow pt's sleep therapy care.    FYI: I did not send Dr. Caren Macadam CPAP order to at [facility]  Fax [phone]  [phone]

## 2021-09-28 NOTE — Addendum Note (Signed)
Addended by: Reesa Chew on: 09/28/2021 11:06 AM   Modules accepted: Orders

## 2021-10-11 ENCOUNTER — Encounter: Payer: Self-pay | Admitting: Family Medicine

## 2021-10-11 ENCOUNTER — Ambulatory Visit (INDEPENDENT_AMBULATORY_CARE_PROVIDER_SITE_OTHER): Payer: Medicare Other | Admitting: Family Medicine

## 2021-10-11 VITALS — BP 132/78 | HR 75 | Ht 71.0 in | Wt 291.6 lb

## 2021-10-11 DIAGNOSIS — I2510 Atherosclerotic heart disease of native coronary artery without angina pectoris: Secondary | ICD-10-CM | POA: Diagnosis not present

## 2021-10-11 DIAGNOSIS — L918 Other hypertrophic disorders of the skin: Secondary | ICD-10-CM | POA: Diagnosis not present

## 2021-10-11 NOTE — Progress Notes (Signed)
Subjective:    Patient ID: Harold Robertson, male    DOB: 06-28-1950, 71 y.o.   MRN: [id number]  HPI  Request to have a skin tag removed from left side in mid axillary line. Past Medical History:  Diagnosis Date   Arthritis    Atrial fibrillation (HCC)    Complete heart block (HCC)    Hypertension    OSA on CPAP    Presence of permanent cardiac pacemaker    Past Surgical History:  Procedure Laterality Date   APPENDECTOMY  1960s   CARDIAC CATHETERIZATION Right 09/08/2015   Procedure: Temporary Pacemaker;  Surgeon: Harold Jorja Loa, MD;  Location: [facility];  Service: Cardiovascular;  Laterality: Right;   CARDIAC CATHETERIZATION N/A 09/09/2015   Procedure: Left Heart Cath and Coronary Angiography;  Surgeon: Harold M Swaziland, MD;  Location: [facility] INVASIVE CV LAB;  Service: Cardiovascular;  Laterality: N/A;   CARDIOVERSION N/A 01/28/2020   Procedure: CARDIOVERSION;  Surgeon: Harold Bathe, MD;  Location: [facility] ENDOSCOPY;  Service: Cardiovascular;  Laterality: N/A;   CHEILECTOMY Left 03/15/2018   Procedure: CHEILECTOMY LEFT FOOT;  Surgeon: Harold Apley, MD;  Location: [facility];  Service: Orthopedics;  Laterality: Left;   COLONOSCOPY     CYSTOSCOPY N/A 03/22/2021   Procedure: CYSTOSCOPY FLEXIBLE; ATTEMPTED FOLEY INSERTION;  Surgeon: Harold Apley, MD;  Location: [facility];  Service: Orthopedics;  Laterality: N/A;   EP IMPLANTABLE DEVICE N/A 09/09/2015   Procedure: BiV Pacemaker Insertion CRT-P;  Surgeon: Harold Jorja Loa, MD;  Location: [facility];  Service: Cardiovascular;  Laterality: N/A;   KNEE SURGERY Right    TOTAL KNEE ARTHROPLASTY Right 07/11/2016   TOTAL KNEE ARTHROPLASTY Right 07/11/2016   Procedure: TOTAL KNEE ARTHROPLASTY;  Surgeon: Harold Apley, MD;  Location: [facility] OR;  Service: Orthopedics;  Laterality: Right;   TOTAL KNEE ARTHROPLASTY WITH REVISION COMPONENTS Right 03/22/2021   Procedure: TOTAL KNEE ARTHROPLASTY WITH REVISION  COMPONENTS (POLY EXCHANGE); I&D;  Surgeon: Harold Apley, MD;  Location: [facility];  Service: Orthopedics;  Laterality: Right;   Current Outpatient Medications on File Prior to Visit  Medication Sig Dispense Refill   acetaminophen (TYLENOL) 500 MG tablet Take 2 tablets (1,000 mg total) by mouth every 6 (six) hours as needed for mild pain or moderate pain. 60 tablet 0   atorvastatin (LIPITOR) 40 MG tablet TAKE 1 TABLET BY MOUTH EVERY DAY AT 6PM 90 tablet 3   carvedilol (COREG) 12.5 MG tablet TAKE 1 TABLET BY MOUTH TWICE A DAY 180 tablet 3   cholecalciferol (VITAMIN D) 25 MCG (1000 UNIT) tablet Take 1,000 Units by mouth daily.     ELIQUIS 5 MG TABS tablet TAKE 1 TABLET BY MOUTH TWICE A DAY 180 tablet 1   lisinopril (ZESTRIL) 40 MG tablet Take 40 mg by mouth daily. Dr. Marja Robertson     methocarbamol (ROBAXIN-750) 750 MG tablet Take 1 tablet (750 mg total) by mouth every 8 (eight) hours as needed for muscle spasms. 20 tablet 0   No current facility-administered medications on file prior to visit.   No Known Allergies Social History   Socioeconomic History   Marital status: Married    Spouse name: Not on file   Number of children: Not on file   Years of education: Not on file   Highest education level: Not on file  Occupational History   Occupation: truck driver  Tobacco Use   Smoking status: Former    Types: Cigars  Smokeless tobacco: Current    Types: Snuff   Tobacco comments:    occ  Vaping Use   Vaping Use: Never used  Substance and Sexual Activity   Alcohol use: Yes    Alcohol/week: 1.0 standard drink of alcohol    Types: 1 Cans of beer per week    Comment: occ   Drug use: No   Sexual activity: Yes  Other Topics Concern   Not on file  Social History Narrative   Marital status: Married x 30 years; first marriage     Children: 2 children; no grandchildren     Lives: lives with wife      Employment: truck driver x 14 years; [place].        Tobacco:  None; pipes and  cigars; chews tobacco.       Alcohol:  Weekends.      Drugs:  None      Exercise:  Rarely.      Seatbelt:  100%      Guns:  Loaded and secured.   Social Determinants of Health   Financial Resource Strain: Not on file  Food Insecurity: Not on file  Transportation Needs: Not on file  Physical Activity: Not on file  Stress: Not on file  Social Connections: Not on file  Intimate Partner Violence: Not on file     Review of Systems  All other systems reviewed and are negative.      Objective:   Physical Exam Vitals reviewed.  Constitutional:      Appearance: Normal appearance.  Cardiovascular:     Rate and Rhythm: Normal rate and regular rhythm.     Heart sounds: Normal heart sounds.  Pulmonary:     Effort: Pulmonary effort is normal.     Breath sounds: Normal breath sounds.  Chest:    Neurological:     Mental Status: He is alert.           Assessment & Plan:  Skin tag- Anesthetized with 0.1% lidocaine with epi and removed in sterile fashion with shave biopsy technique.  Hemostasis achieved with drysol and a bandaid.

## 2021-10-17 ENCOUNTER — Ambulatory Visit (INDEPENDENT_AMBULATORY_CARE_PROVIDER_SITE_OTHER): Payer: Medicare Other

## 2021-10-17 DIAGNOSIS — I442 Atrioventricular block, complete: Secondary | ICD-10-CM

## 2021-10-17 DIAGNOSIS — I428 Other cardiomyopathies: Secondary | ICD-10-CM

## 2021-10-17 LAB — CUP PACEART REMOTE DEVICE CHECK
Battery Remaining Longevity: 1 mo
Battery Remaining Percentage: 2 %
Battery Voltage: 2.68 V
Brady Statistic AP VP Percent: 88 %
Brady Statistic AP VS Percent: 1 %
Brady Statistic AS VP Percent: 12 %
Brady Statistic AS VS Percent: 1 %
Brady Statistic RA Percent Paced: 88 %
Date Time Interrogation Session: 20230724102818
Implantable Lead Implant Date: 20170615
Implantable Lead Implant Date: 20170615
Implantable Lead Implant Date: 20170615
Implantable Lead Location: 753858
Implantable Lead Location: 753859
Implantable Lead Location: 753860
Implantable Pulse Generator Implant Date: 20170615
Lead Channel Impedance Value: 460 Ohm
Lead Channel Impedance Value: 460 Ohm
Lead Channel Impedance Value: 900 Ohm
Lead Channel Pacing Threshold Amplitude: 0.5 V
Lead Channel Pacing Threshold Amplitude: 0.875 V
Lead Channel Pacing Threshold Amplitude: 1.625 V
Lead Channel Pacing Threshold Pulse Width: 0.4 ms
Lead Channel Pacing Threshold Pulse Width: 0.4 ms
Lead Channel Pacing Threshold Pulse Width: 0.8 ms
Lead Channel Sensing Intrinsic Amplitude: 12 mV
Lead Channel Sensing Intrinsic Amplitude: 3.6 mV
Lead Channel Setting Pacing Amplitude: 1.5 V
Lead Channel Setting Pacing Amplitude: 2 V
Lead Channel Setting Pacing Amplitude: 2.625
Lead Channel Setting Pacing Pulse Width: 0.4 ms
Lead Channel Setting Pacing Pulse Width: 0.8 ms
Lead Channel Setting Sensing Sensitivity: 5 mV
Pulse Gen Model: 3262
Pulse Gen Serial Number: 7894586

## 2021-10-22 ENCOUNTER — Other Ambulatory Visit: Payer: Self-pay | Admitting: Cardiology

## 2021-10-24 NOTE — Telephone Encounter (Signed)
Prescription refill request for Eliquis received. Indication: Atrial Fib Last office visit: 09/05/21  Carleene Mains MD Scr: 0.98 on 08/02/21 Age: 71 Weight: 137.4  Based on above findings Eliquis 5mg  twice daily is the appropriate dose.  Refill approved.

## 2021-11-01 ENCOUNTER — Ambulatory Visit (INDEPENDENT_AMBULATORY_CARE_PROVIDER_SITE_OTHER): Payer: Medicare Other | Admitting: Internal Medicine

## 2021-11-01 ENCOUNTER — Encounter: Payer: Self-pay | Admitting: Internal Medicine

## 2021-11-01 ENCOUNTER — Other Ambulatory Visit: Payer: Self-pay

## 2021-11-01 VITALS — BP 122/88 | HR 90 | Resp 16 | Ht 71.0 in | Wt 299.0 lb

## 2021-11-01 DIAGNOSIS — T8453XD Infection and inflammatory reaction due to internal right knee prosthesis, subsequent encounter: Secondary | ICD-10-CM | POA: Diagnosis not present

## 2021-11-01 DIAGNOSIS — A491 Streptococcal infection, unspecified site: Secondary | ICD-10-CM

## 2021-11-01 MED ORDER — AMOXICILLIN 500 MG PO CAPS: 500.0000 mg | ORAL_CAPSULE | Freq: Three times a day (TID) | ORAL | 3 refills | Status: AC

## 2021-11-01 NOTE — Patient Instructions (Signed)
Thank you for coming to see me today. It was a pleasure seeing you.  To Do: Continue taking amoxicillin 500mg  three times per day Refills sent Follow up in 4 months Labs today  If you have any questions or concerns, please do not hesitate to call the office at [phone].  Take Care,   [phone]

## 2021-11-01 NOTE — Assessment & Plan Note (Signed)
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  Completed 6 weeks of IV antibiotics on 05/03/21 and transitioned to amoxicillin suppression 500mg  TID since that time which he continue to take through today. He continues to do well without any signs or symptoms of relapsed infection. Discussed with patient there is no definitive test of cure to determine when all infection has been eradicated other than to stop therapy and observe.  Discussed risk vs benefits and what would be associated with relapsed infection such as another surgery.  Will continue antibiotics for now and check labs today.  RTC 5 months.

## 2021-11-01 NOTE — Progress Notes (Signed)
[facility]  CHIEF COMPLAINT:    Follow up for PJI  SUBJECTIVE:    Harold Robertson is a 71 y.o. male with PMHx as below who presents to the clinic for PJI.   Patient presents today for routine follow up regarding right knee PJI.  He is status post I&D and poly exchange on 12/27 with Dr Eulah Pont.  OR cultures grew GBS and he was treated with ceftriaxone via PICC line x 6 weeks (end date 05/03/21).  He was then transitioned to amoxicillin 500mg  TID PO which continues to take TID.  He was last seen in May 2023 and inflammatory markers at that time were normalized.   Today, reports doing well overall.  No knee pain. No fevers, no diarrhea.   Please see A&P for the details of today's visit and status of the patient's medical problems.   Patient's Medications  New Prescriptions   No medications on file  Previous Medications   ACETAMINOPHEN (TYLENOL) 500 MG TABLET    Take 2 tablets (1,000 mg total) by mouth every 6 (six) hours as needed for mild pain or moderate pain.   ATORVASTATIN (LIPITOR) 40 MG TABLET    TAKE 1 TABLET BY MOUTH EVERY DAY AT 6PM   CARVEDILOL (COREG) 12.5 MG TABLET    TAKE 1 TABLET BY MOUTH TWICE A DAY   CHOLECALCIFEROL (VITAMIN D) 25 MCG (1000 UNIT) TABLET    Take 1,000 Units by mouth daily.   ELIQUIS 5 MG TABS TABLET    TAKE 1 TABLET BY MOUTH TWICE A DAY   LISINOPRIL (ZESTRIL) 40 MG TABLET    Take 40 mg by mouth daily. Dr. June 2023   METHOCARBAMOL (ROBAXIN-750) 750 MG TABLET    Take 1 tablet (750 mg total) by mouth every 8 (eight) hours as needed for muscle spasms.  Modified Medications   Modified Medication Previous Medication   AMOXICILLIN (AMOXIL) 500 MG CAPSULE amoxicillin (AMOXIL) 500 MG capsule      Take 1 capsule (500 mg total) by mouth 3 (three) times daily.    Take 1 capsule (500 mg total) by mouth 3 (three) times daily.  Discontinued Medications   No medications on file      Past Medical History:  Diagnosis Date    Arthritis    Atrial fibrillation (HCC)    Complete heart block (HCC)    Hypertension    OSA on CPAP    Presence of permanent cardiac pacemaker     Social History   Tobacco Use   Smoking status: Former    Types: Cigars   Smokeless tobacco: Current    Types: Snuff   Tobacco comments:    occ  Vaping Use   Vaping Use: Never used  Substance Use Topics   Alcohol use: Yes    Alcohol/week: 1.0 standard drink of alcohol    Types: 1 Cans of beer per week    Comment: occ   Drug use: No    Family History  Problem Relation Age of Onset   Heart disease Father 65       AMI   Liver cancer Mother    Cancer Mother 65       Colon cancer with liver mets   Heart disease Brother 43       AMI   Heart disease Maternal Grandmother     No Known Allergies  Review of Systems  All other systems reviewed and are negative.  Except  as noted above in HPI.   OBJECTIVE:    Vitals:   11/01/21 0842  BP: 122/88  Pulse: 90  Resp: 16  SpO2: 96%  Weight: 299 lb (135.6 kg)  Height: 5\' 11"  (1.803 m)   Body mass index is 41.7 kg/m.  Physical Exam Constitutional:      General: He is not in acute distress.    Appearance: Normal appearance.  HENT:     Head: Normocephalic and atraumatic.  Pulmonary:     Effort: Pulmonary effort is normal. No respiratory distress.  Skin:    General: Skin is warm and dry.  Neurological:     General: No focal deficit present.     Mental Status: He is alert and oriented to person, place, and time.  Psychiatric:        Mood and Affect: Mood normal.        Behavior: Behavior normal.      Labs and Microbiology:    Latest Ref Rng & Units 08/02/2021    9:02 AM 06/02/2021   11:28 AM 03/22/2021    7:17 PM  CBC  WBC 3.8 - 10.8 Thousand/uL 5.6  4.9  9.3   Hemoglobin 13.2 - 17.1 g/dL 03/24/2021  53.9  76.7   Hematocrit 38.5 - 50.0 % 44.8  43.7  39.6   Platelets 140 - 400 Thousand/uL 151  175  211       Latest Ref Rng & Units 08/02/2021    9:02 AM 06/02/2021    11:28 AM 03/23/2021    8:48 AM  CMP  Glucose 65 - 99 mg/dL 03/25/2021  937  902   BUN 7 - 25 mg/dL 10  9  15    Creatinine 0.70 - 1.28 mg/dL 409   7.35   Sodium 135 - 146 mmol/L 142  139  132   Potassium 3.5 - 5.3 mmol/L 4.1  4.4  4.5   Chloride 98 - 110 mmol/L 109  104  99   CO2 20 - 32 mmol/L 26  26  28    Calcium 8.6 - 10.3 mg/dL 9.0  9.4  8.6   Total Protein 6.5 - 8.1 g/dL   7.0   Total Bilirubin 0.3 - 1.2 mg/dL   0.9   Alkaline Phos 38 - 126 U/L   65   AST 15 - 41 U/L   35   ALT 0 - 44 U/L   62       ASSESSMENT & PLAN:    Infection of total right knee replacement (HCC) Patient with 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  Completed 6 weeks of IV antibiotics on 05/03/21 and transitioned to amoxicillin suppression 500mg  TID since that time which he continue to take through today. He continues to do well without any signs or symptoms of relapsed infection. Discussed with patient there is no definitive test of cure to determine when all infection has been eradicated other than to stop therapy and observe.  Discussed risk vs benefits and what would be associated with relapsed infection such as another surgery.  Will continue antibiotics for now and check labs today.  RTC 5 months.    Orders Placed This Encounter  Procedures   CBC   Basic Metabolic Panel (BMET)   Sedimentation rate   C-reactive protein       for Infectious Disease [facility] 11/01/2021, 8:55 AM

## 2021-11-02 LAB — BASIC METABOLIC PANEL
BUN: 12 mg/dL (ref 7–25)
CO2: 27 mmol/L (ref 20–32)
Calcium: 9 mg/dL (ref 8.6–10.3)
Chloride: 108 mmol/L (ref 98–110)
Creat: 0.9 mg/dL (ref 0.70–1.28)
Glucose, Bld: 105 mg/dL — ABNORMAL HIGH (ref 65–99)
Potassium: 4.1 mmol/L (ref 3.5–5.3)
Sodium: 141 mmol/L (ref 135–146)

## 2021-11-02 LAB — CBC
HCT: 43.4 % (ref 38.5–50.0)
Hemoglobin: 14.7 g/dL (ref 13.2–17.1)
MCH: 30.1 pg (ref 27.0–33.0)
MCHC: 33.9 g/dL (ref 32.0–36.0)
MCV: 88.9 fL (ref 80.0–100.0)
MPV: 10.9 fL (ref 7.5–12.5)
Platelets: 137 10*3/uL — ABNORMAL LOW (ref 140–400)
RBC: 4.88 10*6/uL (ref 4.20–5.80)
RDW: 13.3 % (ref 11.0–15.0)
WBC: 4.2 10*3/uL (ref 3.8–10.8)

## 2021-11-02 LAB — SEDIMENTATION RATE: Sed Rate: 2 mm/h (ref 0–20)

## 2021-11-02 LAB — C-REACTIVE PROTEIN: CRP: 0.4 mg/L (ref <8.0)

## 2021-11-17 ENCOUNTER — Ambulatory Visit: Payer: Medicare Other

## 2021-11-17 DIAGNOSIS — I428 Other cardiomyopathies: Secondary | ICD-10-CM

## 2021-11-17 NOTE — Progress Notes (Signed)
Remote pacemaker transmission.   

## 2021-11-18 LAB — CUP PACEART REMOTE DEVICE CHECK
Battery Remaining Longevity: 1 mo
Battery Remaining Longevity: 1 mo
Battery Remaining Percentage: 0.5 %
Battery Remaining Percentage: 0.5 %
Battery Voltage: 2.63 V
Battery Voltage: 2.63 V
Brady Statistic AP VP Percent: 88 %
Brady Statistic AP VP Percent: 88 %
Brady Statistic AP VS Percent: 1 %
Brady Statistic AP VS Percent: 1 %
Brady Statistic AS VP Percent: 12 %
Brady Statistic AS VP Percent: 12 %
Brady Statistic AS VS Percent: 1 %
Brady Statistic AS VS Percent: 1 %
Brady Statistic RA Percent Paced: 88 %
Brady Statistic RA Percent Paced: 88 %
Date Time Interrogation Session: 20230824112647
Date Time Interrogation Session: 20230825101751
Implantable Lead Implant Date: 20170615
Implantable Lead Implant Date: 20170615
Implantable Lead Implant Date: 20170615
Implantable Lead Implant Date: 20170615
Implantable Lead Implant Date: 20170615
Implantable Lead Implant Date: 20170615
Implantable Lead Location: 753858
Implantable Lead Location: 753859
Implantable Lead Location: 753860
Implantable Lead Location: 753858
Implantable Lead Location: 753859
Implantable Lead Location: 753860
Implantable Pulse Generator Implant Date: 20170615
Implantable Pulse Generator Implant Date: 20170615
Lead Channel Impedance Value: 440 Ohm
Lead Channel Impedance Value: 480 Ohm
Lead Channel Impedance Value: 850 Ohm
Lead Channel Impedance Value: 480 Ohm
Lead Channel Impedance Value: 480 Ohm
Lead Channel Impedance Value: 990 Ohm
Lead Channel Pacing Threshold Amplitude: 0.5 V
Lead Channel Pacing Threshold Amplitude: 1 V
Lead Channel Pacing Threshold Amplitude: 1.75 V
Lead Channel Pacing Threshold Amplitude: 0.5 V
Lead Channel Pacing Threshold Amplitude: 1 V
Lead Channel Pacing Threshold Amplitude: 1.75 V
Lead Channel Pacing Threshold Pulse Width: 0.4 ms
Lead Channel Pacing Threshold Pulse Width: 0.4 ms
Lead Channel Pacing Threshold Pulse Width: 0.8 ms
Lead Channel Pacing Threshold Pulse Width: 0.4 ms
Lead Channel Pacing Threshold Pulse Width: 0.4 ms
Lead Channel Pacing Threshold Pulse Width: 0.8 ms
Lead Channel Sensing Intrinsic Amplitude: 12 mV
Lead Channel Sensing Intrinsic Amplitude: 5 mV
Lead Channel Sensing Intrinsic Amplitude: 12 mV
Lead Channel Sensing Intrinsic Amplitude: 5 mV
Lead Channel Setting Pacing Amplitude: 1.5 V
Lead Channel Setting Pacing Amplitude: 2 V
Lead Channel Setting Pacing Amplitude: 2.75 V
Lead Channel Setting Pacing Amplitude: 1.5 V
Lead Channel Setting Pacing Amplitude: 2 V
Lead Channel Setting Pacing Amplitude: 2.75 V
Lead Channel Setting Pacing Pulse Width: 0.4 ms
Lead Channel Setting Pacing Pulse Width: 0.8 ms
Lead Channel Setting Pacing Pulse Width: 0.4 ms
Lead Channel Setting Pacing Pulse Width: 0.8 ms
Lead Channel Setting Sensing Sensitivity: 5 mV
Lead Channel Setting Sensing Sensitivity: 5 mV
Pulse Gen Model: 3262
Pulse Gen Model: 3262
Pulse Gen Serial Number: 7894586
Pulse Gen Serial Number: 7894586

## 2021-11-29 ENCOUNTER — Telehealth: Payer: Self-pay

## 2021-11-29 DIAGNOSIS — I480 Paroxysmal atrial fibrillation: Secondary | ICD-10-CM

## 2021-11-29 DIAGNOSIS — I442 Atrioventricular block, complete: Secondary | ICD-10-CM

## 2021-11-29 NOTE — Telephone Encounter (Signed)
Device alert for ERI reached 9/3.  Outreach made to Pt.  Advised of ERI.  Advised he will get a call from Dr. Elberta Fortis' nurse to schedule gen change (see last OV note)

## 2021-11-30 NOTE — Telephone Encounter (Signed)
Device has reached ERI 11/27/21. Saw by Dr. Estill Dooms 09/05/21, discussed procedure and does not need OV prior to scheduling. Patient advised that his device is working normally and he may have extra beats in the bottom part of his heart called PVC's that can make the number vary on different machine. Reassured patient his device is working properly. Patient was appreciative of call.

## 2021-11-30 NOTE — Telephone Encounter (Signed)
Patient called back wanting to know it was normal for his heart rate to be below 60 when its not suppose to go that low according to WC. Patient also wants to set up gen change as well.

## 2021-12-13 NOTE — Addendum Note (Signed)
Addended by: Cheri Kearns A on: 12/13/2021 09:41 AM   Modules accepted: Level of Service

## 2021-12-13 NOTE — Progress Notes (Signed)
Remote pacemaker transmission.   

## 2021-12-19 ENCOUNTER — Ambulatory Visit (INDEPENDENT_AMBULATORY_CARE_PROVIDER_SITE_OTHER): Payer: Medicare Other

## 2021-12-19 DIAGNOSIS — I428 Other cardiomyopathies: Secondary | ICD-10-CM

## 2021-12-20 LAB — CUP PACEART REMOTE DEVICE CHECK
Battery Remaining Longevity: 0 mo
Battery Voltage: 2.6 V
Brady Statistic AP VP Percent: 88 %
Brady Statistic AP VS Percent: 1 %
Brady Statistic AS VP Percent: 12 %
Brady Statistic AS VS Percent: 1 %
Brady Statistic RA Percent Paced: 88 %
Date Time Interrogation Session: 20230925040013
Implantable Lead Implant Date: 20170615
Implantable Lead Implant Date: 20170615
Implantable Lead Implant Date: 20170615
Implantable Lead Location: 753858
Implantable Lead Location: 753859
Implantable Lead Location: 753860
Implantable Pulse Generator Implant Date: 20170615
Lead Channel Impedance Value: 440 Ohm
Lead Channel Impedance Value: 480 Ohm
Lead Channel Impedance Value: 780 Ohm
Lead Channel Pacing Threshold Amplitude: 0.5 V
Lead Channel Pacing Threshold Amplitude: 1 V
Lead Channel Pacing Threshold Amplitude: 1.625 V
Lead Channel Pacing Threshold Pulse Width: 0.4 ms
Lead Channel Pacing Threshold Pulse Width: 0.4 ms
Lead Channel Pacing Threshold Pulse Width: 0.8 ms
Lead Channel Sensing Intrinsic Amplitude: 12 mV
Lead Channel Sensing Intrinsic Amplitude: 4 mV
Lead Channel Setting Pacing Amplitude: 1.5 V
Lead Channel Setting Pacing Amplitude: 2 V
Lead Channel Setting Pacing Amplitude: 2.625
Lead Channel Setting Pacing Pulse Width: 0.4 ms
Lead Channel Setting Pacing Pulse Width: 0.8 ms
Lead Channel Setting Sensing Sensitivity: 5 mV
Pulse Gen Model: 3262
Pulse Gen Serial Number: 7894586

## 2021-12-30 NOTE — Progress Notes (Signed)
Remote pacemaker transmission.   

## 2021-12-30 NOTE — Telephone Encounter (Signed)
Pt is scheduled for a PPM Gen Change on 02/02/22 @ 4:30pm. He is aware of the instructions and letter was also sent via [place]. Labs are scheduled for 10/31 and he will pick up surgical scrub from the front desk on that day.

## 2022-01-05 ENCOUNTER — Ambulatory Visit: Payer: Medicare Other | Attending: Cardiology | Admitting: Cardiology

## 2022-01-05 ENCOUNTER — Encounter: Payer: Self-pay | Admitting: Cardiology

## 2022-01-05 VITALS — BP 129/79 | HR 55 | Ht 71.0 in | Wt 283.0 lb

## 2022-01-05 DIAGNOSIS — I10 Essential (primary) hypertension: Secondary | ICD-10-CM | POA: Diagnosis not present

## 2022-01-05 DIAGNOSIS — G4733 Obstructive sleep apnea (adult) (pediatric): Secondary | ICD-10-CM | POA: Insufficient documentation

## 2022-01-05 DIAGNOSIS — I2510 Atherosclerotic heart disease of native coronary artery without angina pectoris: Secondary | ICD-10-CM | POA: Diagnosis not present

## 2022-01-05 NOTE — Patient Instructions (Signed)
Medication Instructions:  Your physician recommends that you continue on your current medications as directed. Please refer to the Current Medication list given to you today.  *If you need a refill on your cardiac medications before your next appointment, please call your pharmacy*  Follow-Up: At [facility], you and your health needs are our priority.  As part of our continuing mission to provide you with exceptional heart care, we have created designated Provider Care Teams.  These Care Teams include your primary Cardiologist (physician) and Advanced Practice Providers (APPs -  Physician Assistants and Nurse Practitioners) who all work together to provide you with the care you need, when you need it.  Your next appointment:   1 year(s)  The format for your next appointment:   In Person  Provider:   Traci Turner, MD     Important Information About Sugar       

## 2022-01-05 NOTE — Progress Notes (Signed)
SLEEP MEDICINE VIRTUAL CONSULT NOTE     Virtual Visit via Video Note   Because of Harold Robertson's co-morbid illnesses, he is at least at moderate risk for complications without adequate follow up.  This format is felt to be most appropriate for this patient at this time.  All issues noted in this document were discussed and addressed.  A limited physical exam was performed with this format.  Please refer to the patient's chart for his consent to telehealth for [facility].  Date:  01/05/2022   ID:  Harold Robertson, DOB [birth year], MRN [id number] The patient was identified using 2 identifiers.  Patient Location: Home Provider Location: Office/Clinic   PCP:  Donita Brooks, MD   [facility] Providers Cardiologist:  Chilton Si, MD Electrophysiologist:  Will Jorja Loa, MD     Evaluation Performed:  Consultation - Harold Robertson was referred by Lorenso Courier, MD for the evaluation of OSA.  Chief Complaint:  OSA  History of Present Illness:    Harold Robertson is a 71 y.o. male who is being seen today for the evaluation of OSA at the request of Lorenso Courier, MD.  Harold Robertson is a 71 y.o. male with a history of CHB s/p PPM, HTN, PAF and OAS on CPAP.  He has a history of OSA  which was first diagnosed in 2011 and has been on CPAP therapy but has not followed with a sleep medicine physician. 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   He is doing well with his PAP device and thinks that he has gotten used to it.  He tolerates the mask and feels the pressure is adequate.  Since going on PAP he feels rested in the am and has no significant daytime sleepiness.  He denies any significant mouth or nasal dryness or nasal congestion.  He does not think that he snores.     Past Medical History:  Diagnosis Date   Arthritis    Atrial fibrillation  (HCC)    Complete heart block (HCC)    Hypertension    OSA on CPAP    Presence of permanent cardiac pacemaker    Past Surgical History:  Procedure Laterality Date   APPENDECTOMY  1960s   CARDIAC CATHETERIZATION Right 09/08/2015   Procedure: Temporary Pacemaker;  Surgeon: Will Jorja Loa, MD;  Location: [facility] INVASIVE CV LAB;  Service: Cardiovascular;  Laterality: Right;   CARDIAC CATHETERIZATION N/A 09/09/2015   Procedure: Left Heart Cath and Coronary Angiography;  Surgeon: Peter M Swaziland, MD;  Location: [facility] INVASIVE CV LAB;  Service: Cardiovascular;  Laterality: N/A;   CARDIOVERSION N/A 01/28/2020   Procedure: CARDIOVERSION;  Surgeon: Jake Bathe, MD;  Location: [facility] ENDOSCOPY;  Service: Cardiovascular;  Laterality: N/A;   CHEILECTOMY Left 03/15/2018   Procedure: CHEILECTOMY LEFT FOOT;  Surgeon: Sheral Apley, MD;  Location: [facility];  Service: Orthopedics;  Laterality: Left;   COLONOSCOPY     CYSTOSCOPY N/A 03/22/2021   Procedure: CYSTOSCOPY FLEXIBLE; ATTEMPTED FOLEY INSERTION;  Surgeon: Sheral Apley, MD;  Location: [facility];  Service: Orthopedics;  Laterality: N/A;   EP IMPLANTABLE DEVICE N/A 09/09/2015   Procedure: BiV Pacemaker Insertion CRT-P;  Surgeon: Will Jorja Loa, MD;  Location: [facility] INVASIVE CV LAB;  Service: Cardiovascular;  Laterality: N/A;   KNEE SURGERY Right  TOTAL KNEE ARTHROPLASTY Right 07/11/2016   TOTAL KNEE ARTHROPLASTY Right 07/11/2016   Procedure: TOTAL KNEE ARTHROPLASTY;  Surgeon: Sheral Apley, MD;  Location: [facility] OR;  Service: Orthopedics;  Laterality: Right;   TOTAL KNEE ARTHROPLASTY WITH REVISION COMPONENTS Right 03/22/2021   Procedure: TOTAL KNEE ARTHROPLASTY WITH REVISION COMPONENTS (POLY EXCHANGE); I&D;  Surgeon: Sheral Apley, MD;  Location: [facility]S;  Service: Orthopedics;  Laterality: Right;     Current Meds  Medication Sig   acetaminophen (TYLENOL) 500 MG tablet Take 2 tablets (1,000 mg total) by mouth every 6 (six) hours  as needed for mild pain or moderate pain.   amoxicillin (AMOXIL) 500 MG capsule Take 1 capsule (500 mg total) by mouth 3 (three) times daily.   atorvastatin (LIPITOR) 40 MG tablet TAKE 1 TABLET BY MOUTH EVERY DAY AT 6PM   carvedilol (COREG) 12.5 MG tablet TAKE 1 TABLET BY MOUTH TWICE A DAY   cholecalciferol (VITAMIN D) 25 MCG (1000 UNIT) tablet Take 1,000 Units by mouth daily.   ELIQUIS 5 MG TABS tablet TAKE 1 TABLET BY MOUTH TWICE A DAY   lisinopril (ZESTRIL) 40 MG tablet Take 40 mg by mouth daily. Dr. Marja Kays   methocarbamol (ROBAXIN-750) 750 MG tablet Take 1 tablet (750 mg total) by mouth every 8 (eight) hours as needed for muscle spasms.     Allergies:   Patient has no known allergies.   Social History   Tobacco Use   Smoking status: Former    Types: Cigars   Smokeless tobacco: Current    Types: Snuff   Tobacco comments:    occ  Vaping Use   Vaping Use: Never used  Substance Use Topics   Alcohol use: Yes    Alcohol/week: 1.0 standard drink of alcohol    Types: 1 Cans of beer per week    Comment: occ   Drug use: No     Family Hx: The patient's family history includes Cancer (age of onset: 24) in his mother; Heart disease in his maternal grandmother; Heart disease (age of onset: 54) in his brother; Heart disease (age of onset: 34) in his father; Liver cancer in his mother.  ROS:   Please see the history of present illness.     All other systems reviewed and are negative.   Prior Sleep studies:   The following studies were reviewed today:    Labs/Other Tests and Data Reviewed:     Recent Labs: 03/23/2021: ALT 62 11/01/2021: BUN 12; Creat 0.90; Hemoglobin 14.7; Platelets 137; Potassium 4.1; Sodium 141    Wt Readings from Last 3 Encounters:  01/05/22 283 lb (128.4 kg)  11/01/21 299 lb (135.6 kg)  10/11/21 291 lb 9.6 oz (132.3 kg)     Risk Assessment/Calculations:    CHA2DS2-VASc Score = 4   This indicates a 4.8% annual risk of stroke. The patient's score is  based upon: CHF History: 1 HTN History: 1 Diabetes History: 0 Stroke History: 0 Vascular Disease History: 1 Age Score: 1 Gender Score: 0         Objective:    Vital Signs:  BP 129/79   Pulse (!) 55   Ht 5\' 11"  (1.803 m)   Wt 283 lb (128.4 kg)   BMI 39.47 kg/m    VITAL SIGNS:  reviewed GEN:  no acute distress EYES:  sclerae anicteric, EOMI - Extraocular Movements Intact RESPIRATORY:  normal respiratory effort, symmetric expansion CARDIOVASCULAR:  no peripheral edema SKIN:  no rash, lesions or ulcers. MUSCULOSKELETAL:  no obvious deformities. NEURO:  alert and oriented x 3, no obvious focal deficit PSYCH:  normal affect  ASSESSMENT & PLAN:    OSA - The patient is tolerating PAP therapy well without any problems. The PAP download performed by his DME was personally reviewed and interpreted by me today and showed an AHI of 2.4/hr on 8 cm H2O with 100% compliance in using more than 4 hours nightly.  The patient has been using and benefiting from PAP use and will continue to benefit from therapy.   HTN -BP controlled on exam today -continue prescription drug management with Carvedilol 12.5mg  BID and Lisinopril 40mg  daily with PRN refills   Time:   Today, I have spent 15 minutes with the patient with telehealth technology discussing the above problems.     Medication Adjustments/Labs and Tests Ordered: Current medicines are reviewed at length with the patient today.  Concerns regarding medicines are outlined above.   Tests Ordered: No orders of the defined types were placed in this encounter.   Medication Changes: No orders of the defined types were placed in this encounter.   Follow Up:  In Person in 1 year(s)  Signed, Fransico Him, MD  01/05/2022 9:34 AM    [place]

## 2022-01-19 ENCOUNTER — Ambulatory Visit (INDEPENDENT_AMBULATORY_CARE_PROVIDER_SITE_OTHER): Payer: Medicare Other

## 2022-01-19 DIAGNOSIS — I428 Other cardiomyopathies: Secondary | ICD-10-CM

## 2022-01-23 LAB — CUP PACEART REMOTE DEVICE CHECK
Battery Remaining Longevity: 0 mo
Battery Voltage: 2.57 V
Brady Statistic AP VP Percent: 88 %
Brady Statistic AP VS Percent: 1 %
Brady Statistic AS VP Percent: 12 %
Brady Statistic AS VS Percent: 1 %
Brady Statistic RA Percent Paced: 88 %
Date Time Interrogation Session: 20231030130739
Implantable Lead Connection Status: 753985
Implantable Lead Connection Status: 753985
Implantable Lead Connection Status: 753985
Implantable Lead Implant Date: 20170615
Implantable Lead Implant Date: 20170615
Implantable Lead Implant Date: 20170615
Implantable Lead Location: 753858
Implantable Lead Location: 753859
Implantable Lead Location: 753860
Implantable Pulse Generator Implant Date: 20170615
Lead Channel Impedance Value: 400 Ohm
Lead Channel Impedance Value: 480 Ohm
Lead Channel Impedance Value: 800 Ohm
Lead Channel Pacing Threshold Amplitude: 0.5 V
Lead Channel Pacing Threshold Amplitude: 1 V
Lead Channel Pacing Threshold Amplitude: 1.875 V
Lead Channel Pacing Threshold Pulse Width: 0.4 ms
Lead Channel Pacing Threshold Pulse Width: 0.4 ms
Lead Channel Pacing Threshold Pulse Width: 0.8 ms
Lead Channel Sensing Intrinsic Amplitude: 11.4 mV
Lead Channel Sensing Intrinsic Amplitude: 3.3 mV
Lead Channel Setting Pacing Amplitude: 1.5 V
Lead Channel Setting Pacing Amplitude: 2 V
Lead Channel Setting Pacing Amplitude: 2.875
Lead Channel Setting Pacing Pulse Width: 0.4 ms
Lead Channel Setting Pacing Pulse Width: 0.8 ms
Lead Channel Setting Sensing Sensitivity: 5 mV
Pulse Gen Model: 3262
Pulse Gen Serial Number: 7894586

## 2022-01-24 ENCOUNTER — Ambulatory Visit: Payer: Medicare Other | Attending: Cardiology

## 2022-01-24 DIAGNOSIS — I480 Paroxysmal atrial fibrillation: Secondary | ICD-10-CM | POA: Diagnosis not present

## 2022-01-24 DIAGNOSIS — I442 Atrioventricular block, complete: Secondary | ICD-10-CM | POA: Diagnosis not present

## 2022-01-24 LAB — BASIC METABOLIC PANEL
BUN/Creatinine Ratio: 9 — ABNORMAL LOW (ref 10–24)
BUN: 10 mg/dL (ref 8–27)
CO2: 29 mmol/L (ref 20–29)
Calcium: 9.4 mg/dL (ref 8.6–10.2)
Chloride: 104 mmol/L (ref 96–106)
Creatinine, Ser: 1.06 mg/dL (ref 0.76–1.27)
Glucose: 110 mg/dL — ABNORMAL HIGH (ref 70–99)
Potassium: 4.5 mmol/L (ref 3.5–5.2)
Sodium: 141 mmol/L (ref 134–144)
eGFR: 75 mL/min/{1.73_m2} (ref >59)

## 2022-01-24 LAB — CBC
Hematocrit: 45.5 % (ref 37.5–51.0)
Hemoglobin: 15.6 g/dL (ref 13.0–17.7)
MCH: 30.2 pg (ref 26.6–33.0)
MCHC: 34.3 g/dL (ref 31.5–35.7)
MCV: 88 fL (ref 79–97)
Platelets: 173 10*3/uL (ref 150–450)
RBC: 5.16 x10E6/uL (ref 4.14–5.80)
RDW: 13.5 % (ref 11.6–15.4)
WBC: 6 10*3/uL (ref 3.4–10.8)

## 2022-01-30 NOTE — Progress Notes (Signed)
Remote pacemaker transmission.   

## 2022-01-30 NOTE — Addendum Note (Signed)
Addended by: Cheri Kearns A on: 01/30/2022 01:27 PM   Modules accepted: Level of Service

## 2022-02-01 NOTE — Pre-Procedure Instructions (Signed)
Attempted to call patient regarding procedure instructions.  Left voice mail on the following items: Arrival time 1400 Nothing to eat or drink after midnight No meds AM of procedure Responsible person to drive you home and stay with you for 24 hrs Wash with special soap night before and morning of procedure If on anti-coagulant drug instructions Eliquis- last dose should have been 11/6

## 2022-02-02 ENCOUNTER — Ambulatory Visit (HOSPITAL_COMMUNITY)
Admission: RE | Admit: 2022-02-02 | Discharge: 2022-02-02 | Disposition: A | Discharge status: Home / Self Care | Payer: Medicare Other | Attending: Cardiology | Admitting: Cardiology

## 2022-02-02 ENCOUNTER — Other Ambulatory Visit: Payer: Self-pay

## 2022-02-02 ENCOUNTER — Encounter (HOSPITAL_COMMUNITY)
Admission: RE | Disposition: A | Discharge status: Home / Self Care | Payer: Self-pay | Source: Home / Self Care | Attending: Cardiology

## 2022-02-02 DIAGNOSIS — Z87891 Personal history of nicotine dependence: Secondary | ICD-10-CM | POA: Insufficient documentation

## 2022-02-02 DIAGNOSIS — E669 Obesity, unspecified: Secondary | ICD-10-CM | POA: Insufficient documentation

## 2022-02-02 DIAGNOSIS — Z45010 Encounter for checking and testing of cardiac pacemaker pulse generator [battery]: Secondary | ICD-10-CM | POA: Insufficient documentation

## 2022-02-02 DIAGNOSIS — I442 Atrioventricular block, complete: Secondary | ICD-10-CM | POA: Insufficient documentation

## 2022-02-02 DIAGNOSIS — I4891 Unspecified atrial fibrillation: Secondary | ICD-10-CM | POA: Diagnosis not present

## 2022-02-02 DIAGNOSIS — Z7901 Long term (current) use of anticoagulants: Secondary | ICD-10-CM | POA: Diagnosis not present

## 2022-02-02 DIAGNOSIS — R001 Bradycardia, unspecified: Secondary | ICD-10-CM | POA: Insufficient documentation

## 2022-02-02 DIAGNOSIS — G4733 Obstructive sleep apnea (adult) (pediatric): Secondary | ICD-10-CM | POA: Diagnosis not present

## 2022-02-02 DIAGNOSIS — Z6841 Body Mass Index (BMI) 40.0 and over, adult: Secondary | ICD-10-CM | POA: Diagnosis not present

## 2022-02-02 DIAGNOSIS — I10 Essential (primary) hypertension: Secondary | ICD-10-CM | POA: Diagnosis not present

## 2022-02-02 HISTORY — PX: PPM GENERATOR CHANGEOUT: EP1233

## 2022-02-02 SURGERY — PPM GENERATOR CHANGEOUT

## 2022-02-02 MED ORDER — LIDOCAINE HCL (PF) 1 % IJ SOLN
INTRAMUSCULAR | Status: DC | PRN
  Administered 2022-02-02: 60 mL

## 2022-02-02 MED ORDER — CHLORHEXIDINE GLUCONATE 4 % EX LIQD: 4.0000 | Freq: Once | CUTANEOUS | Status: DC

## 2022-02-02 MED ORDER — ACETAMINOPHEN 325 MG PO TABS: 325.0000 mg | ORAL_TABLET | ORAL | Status: DC | PRN

## 2022-02-02 MED ORDER — ONDANSETRON HCL 4 MG/2ML IJ SOLN: 4.0000 mg | Freq: Four times a day (QID) | INTRAMUSCULAR | Status: DC | PRN

## 2022-02-02 MED ORDER — SODIUM CHLORIDE 0.9 % IV SOLN
INTRAVENOUS | Status: AC
  Filled 2022-02-02: qty 2

## 2022-02-02 MED ORDER — SODIUM CHLORIDE 0.9 % IV SOLN: INTRAVENOUS | Status: DC

## 2022-02-02 MED ORDER — SODIUM CHLORIDE 0.9 % IV SOLN
80.0000 mg | INTRAVENOUS | Status: AC
  Administered 2022-02-02: 80 mg

## 2022-02-02 MED ORDER — CEFAZOLIN IN SODIUM CHLORIDE 3-0.9 GM/100ML-% IV SOLN
3.0000 g | INTRAVENOUS | Status: AC
  Administered 2022-02-02: 3 g via INTRAVENOUS
  Filled 2022-02-02: qty 100

## 2022-02-02 SURGICAL SUPPLY — 5 items
CABLE SURGICAL S-101-97-12 (CABLE) ×1 IMPLANT
PACEMAKER QUDR ALLR CRT PM3562 (Pacemaker) IMPLANT
PAD DEFIB RADIO PHYSIO CONN (PAD) ×1 IMPLANT
PMKR QUADRA ALLURE CRT PM3562 (Pacemaker) ×1 IMPLANT
TRAY PACEMAKER INSERTION (PACKS) ×1 IMPLANT

## 2022-02-02 NOTE — H&P (Signed)
Electrophysiology Office Note   Date:  02/02/2022   ID:  Harold Robertson, DOB [birth year], MRN [id number]  PCP:  Donita Brooks, MD  Cardiologist:  Duke Salvia Primary Electrophysiologist:  Sonya Pucci Jorja Loa, MD    No chief complaint on file.    History of Present Illness: Harold Robertson is a 71 y.o. male who presents today for electrophysiology evaluation.     He has a history significant hypertension, obesity, obstructive sleep apnea on CPAP.  He was hospitalized with complete heart block.  He is status post Saint Jude CRT-P implanted 09/19/2015.  His ejection fraction at that time was 40 to 45%.  He was diagnosed with atrial fibrillation after device alert.  He was started on Eliquis and had a cardioversion 01/28/2020.  Today, denies symptoms of palpitations, chest pain, shortness of breath, orthopnea, PND, lower extremity edema, claudication, dizziness, presyncope, syncope, bleeding, or neurologic sequela. The patient is tolerating medications without difficulties. Plan gen change today.     Past Medical History:  Diagnosis Date   Arthritis    Atrial fibrillation (HCC)    Complete heart block (HCC)    Hypertension    OSA on CPAP    Presence of permanent cardiac pacemaker    Past Surgical History:  Procedure Laterality Date   APPENDECTOMY  1960s   CARDIAC CATHETERIZATION Right 09/08/2015   Procedure: Temporary Pacemaker;  Surgeon: Khaden Gater Jorja Loa, MD;  Location: [facility]VASIVE CV LAB;  Service: Cardiovascular;  Laterality: Right;   CARDIAC CATHETERIZATION N/A 09/09/2015   Procedure: Left Heart Cath and Coronary Angiography;  Surgeon: Peter M Swaziland, MD;  Location: [facility] INVASIVE CV LAB;  Service: Cardiovascular;  Laterality: N/A;   CARDIOVERSION N/A 01/28/2020   Procedure: CARDIOVERSION;  Surgeon: Jake Bathe, MD;  Location: [facility] ENDOSCOPY;  Service: Cardiovascular;  Laterality: N/A;   CHEILECTOMY Left 03/15/2018   Procedure: CHEILECTOMY LEFT FOOT;  Surgeon: Sheral Apley, MD;  Location: [facility];  Service: Orthopedics;  Laterality: Left;   COLONOSCOPY     CYSTOSCOPY N/A 03/22/2021   Procedure: CYSTOSCOPY FLEXIBLE; ATTEMPTED FOLEY INSERTION;  Surgeon: Sheral Apley, MD;  Location: [facility];  Service: Orthopedics;  Laterality: N/A;   EP IMPLANTABLE DEVICE N/A 09/09/2015   Procedure: BiV Pacemaker Insertion CRT-P;  Surgeon: Oma Marzan Jorja Loa, MD;  Location: [facility];  Service: Cardiovascular;  Laterality: N/A;   KNEE SURGERY Right    TOTAL KNEE ARTHROPLASTY Right 07/11/2016   TOTAL KNEE ARTHROPLASTY Right 07/11/2016   Procedure: TOTAL KNEE ARTHROPLASTY;  Surgeon: Sheral Apley, MD;  Location: [facility];  Service: Orthopedics;  Laterality: Right;   TOTAL KNEE ARTHROPLASTY WITH REVISION COMPONENTS Right 03/22/2021   Procedure: TOTAL KNEE ARTHROPLASTY WITH REVISION COMPONENTS (POLY EXCHANGE); I&D;  Surgeon: Sheral Apley, MD;  Location: [facility];  Service: Orthopedics;  Laterality: Right;     Current Facility-Administered Medications  Medication Dose Route Frequency Provider Last Rate Last Admin   0.9 %  sodium chloride infusion   Intravenous Continuous Regan Lemming, MD 50 mL/hr at 02/02/22 1436 New Bag at 02/02/22 1436   ceFAZolin (ANCEF) IVPB 3g/100 mL premix  3 g Intravenous On Call Ronnesha Mester Daphine Deutscher, MD       chlorhexidine (HIBICLENS) 4 % liquid 4 Application  4 Application Topical Once Jerauld Bostwick Daphine Deutscher, MD       gentamicin (GARAMYCIN) 80 mg in sodium chloride 0.9 % 500 mL irrigation  80 mg Irrigation On Call [employer],  MD        Allergies:   Patient has no active allergies.   Social History:  The patient  reports that he has quit smoking. His smoking use included cigars. His smokeless tobacco use includes snuff. He reports current alcohol use of about 1.0 standard drink of alcohol per week. He reports that he does not use drugs.   Family History:  The patient's family history includes Cancer  (age of onset: 22) in his mother; Heart disease in his maternal grandmother; Heart disease (age of onset: 57) in his brother; Heart disease (age of onset: 68) in his father; Liver cancer in his mother.   ROS:  Please see the history of present illness.   Otherwise, review of systems is positive for none.   All other systems are reviewed and negative.   PHYSICAL EXAM: VS:  BP 136/78   Pulse (!) 55   Temp (!) 97.2 F (36.2 C) (Temporal)   Resp 16   Ht 5\' 11"  (1.803 m)   Wt 133.8 kg   SpO2 95%   BMI 41.14 kg/m  , BMI Body mass index is 41.14 kg/m. GEN: Well nourished, well developed, in no acute distress  HEENT: normal  Neck: no JVD, carotid bruits, or masses Cardiac: RRR; no murmurs, rubs, or gallops,no edema  Respiratory:  clear to auscultation bilaterally, normal work of breathing GI: soft, nontender, nondistended, + BS MS: no deformity or atrophy  Skin: warm and dry Neuro:  Strength and sensation are intact Psych: euthymic mood, full affect  Recent Labs: 03/23/2021: ALT 62 01/24/2022: BUN 10; Creatinine, Ser 1.06; Hemoglobin 15.6; Platelets 173; Potassium 4.5; Sodium 141    Lipid Panel     Component Value Date/Time   CHOL 92 03/17/2021 1141   TRIG 73 03/17/2021 1141   HDL 26 (L) 03/17/2021 1141   CHOLHDL 3.5 03/17/2021 1141   VLDL 18 11/16/2016 0950   LDLCALC 51 03/17/2021 1141     Wt Readings from Last 3 Encounters:  02/02/22 133.8 kg  01/05/22 128.4 kg  11/01/21 135.6 kg      Other studies Reviewed: Additional studies/ records that were reviewed today include: Cardiac cath 09/09/15  Review of the above records today demonstrates:  Prox RCA lesion, 20% stenosed. Prox LAD to Mid LAD lesion, 40% stenosed. Mid Cx lesion, 20% stenosed. Ost 2nd Mrg to 2nd Mrg lesion, 20% stenosed. 1st Diag lesion, 30% stenosed. There is mild to moderate left ventricular systolic dysfunction.   1. Nonobstructive CAD 2. Mild to moderate LV dysfunction. EF 40-45%.  TTE  02/01/2018 - Left ventricle: Abnormal septal motion. The cavity size was    normal. Wall thickness was increased in a pattern of mild LVH.    Systolic function was normal. The estimated ejection fraction was    in the range of 55% to 60%. Wall motion was normal; there were no    regional wall motion abnormalities. Doppler parameters are    consistent with abnormal left ventricular relaxation (grade 1    diastolic dysfunction).  - Left atrium: The atrium was mildly dilated.  - Atrial septum: No defect or patent foramen ovale was identified.   ASSESSMENT AND PLAN:  1.  Complete heart block: 13/10/2017 has presented today for surgery, with the diagnosis of heart block.  The various methods of treatment have been discussed with the patient and family. After consideration of risks, benefits and other options for treatment, the patient has consented to  Procedure(s): Pacemaker generator change  implant as a surgical intervention .  Risks include but not limited to bleeding, infection, pneumothorax, perforation, tamponade, vascular damage, renal failure, MI, stroke, death, and lead dislodgement . The patient's history has been reviewed, patient examined, no change in status, stable for surgery.  I have reviewed the patient's chart and labs.  Questions were answered to the patient's satisfaction.    Melady Chow Elberta Fortis, MD 02/02/2022 3:17 PM

## 2022-02-02 NOTE — Discharge Instructions (Signed)

## 2022-02-03 ENCOUNTER — Encounter (HOSPITAL_COMMUNITY): Payer: Self-pay | Admitting: Cardiology

## 2022-02-15 ENCOUNTER — Ambulatory Visit: Payer: Medicare Other | Attending: Interventional Cardiology

## 2022-02-15 DIAGNOSIS — I442 Atrioventricular block, complete: Secondary | ICD-10-CM | POA: Insufficient documentation

## 2022-02-15 DIAGNOSIS — I447 Left bundle-branch block, unspecified: Secondary | ICD-10-CM | POA: Diagnosis not present

## 2022-02-15 DIAGNOSIS — Z950 Presence of cardiac pacemaker: Secondary | ICD-10-CM | POA: Diagnosis not present

## 2022-02-15 NOTE — Patient Instructions (Signed)
   After Your Pacemaker   Monitor your pacemaker site for redness, swelling, and drainage. Call the device clinic at [phone] if you experience these symptoms or fever/chills.  Your incision was closed with Steri-strips or staples:  You may shower 7 days after your procedure and wash your incision with soap and water. Avoid lotions, ointments, or perfumes over your incision until it is well-healed.  You may use a hot tub or a pool after your wound check appointment if the incision is completely closed.  . You may drive, unless driving has been restricted by your healthcare providers.   Remote monitoring is used to monitor your pacemaker from home. This monitoring is scheduled every 91 days by our office. It allows [place] to keep an eye on the functioning of your device to ensure it is working properly. You will routinely see your Electrophysiologist annually (more often if necessary).   HOLD ELIQUIS for 7 days. Resume taking on 02/22/22  Call [phone] for any increased swelling/ new bruising

## 2022-02-15 NOTE — Progress Notes (Signed)
Wound check appointment. Steri-strips removed. Wound without redness or bruising. Incision edges approximated. Normal device function. Site puffy, assessed by Otilio Saber PA recommend patient hold Eliquis for 7 days Thresholds, sensing, and impedances consistent with implant measurements. Device programmed at 3.5V/auto capture programmed on for extra safety margin until 3 month visit. Histogram distribution appropriate for patient and level of activity. No mode switches or high ventricular rates noted. Patient educated about wound care, arm mobility.

## 2022-03-14 ENCOUNTER — Other Ambulatory Visit: Payer: Self-pay | Admitting: Cardiology

## 2022-04-03 ENCOUNTER — Encounter: Payer: Self-pay | Admitting: Internal Medicine

## 2022-04-03 ENCOUNTER — Other Ambulatory Visit: Payer: Self-pay

## 2022-04-03 ENCOUNTER — Ambulatory Visit (INDEPENDENT_AMBULATORY_CARE_PROVIDER_SITE_OTHER): Payer: Medicare Other | Admitting: Internal Medicine

## 2022-04-03 VITALS — BP 147/91 | HR 65 | Temp 98.1°F | Ht 71.0 in | Wt 315.0 lb

## 2022-04-03 DIAGNOSIS — A491 Streptococcal infection, unspecified site: Secondary | ICD-10-CM

## 2022-04-03 DIAGNOSIS — T8453XD Infection and inflammatory reaction due to internal right knee prosthesis, subsequent encounter: Secondary | ICD-10-CM

## 2022-04-03 MED ORDER — AMOXICILLIN 500 MG PO CAPS: 500.0000 mg | ORAL_CAPSULE | Freq: Two times a day (BID) | ORAL | 5 refills | Status: AC

## 2022-04-03 NOTE — Patient Instructions (Incomplete)
Mr. Harold Robertson , Thank you for taking time to come for your Medicare Wellness Visit. I appreciate your ongoing commitment to your health goals. Please review the following plan we discussed and let me know if I can assist you in the future.   These are the goals we discussed:  Goals   None     This is a list of the screening recommended for you and due dates:  Health Maintenance  Topic Date Due   Zoster (Shingles) Vaccine (1 of 2) Never done   COVID-19 Vaccine (3 - Pfizer risk series) 06/03/2019   Flu Shot  10/25/2021   Medicare Annual Wellness Visit  03/17/2022   Colon Cancer Screening  03/10/2025   DTaP/Tdap/Td vaccine (3 - Td or Tdap) 05/11/2026   Pneumonia Vaccine  Completed   Hepatitis C Screening: USPSTF Recommendation to screen - Ages 71-79 yo.  Completed   HPV Vaccine  Aged Out    Advanced directives: Please bring a copy of your health care power of attorney and living will to the office to be added to your chart at your convenience.   Conditions/risks identified: Aim for 30 minutes of exercise or brisk walking, 6-8 glasses of water, and 5 servings of fruits and vegetables each day.   Next appointment: Follow up in one year for your annual wellness visit.   I will check into the coverage of seeing and ENT for you and let you know via MyChart on how it's covered.   Preventive Care 71 Years and Older, Male  Preventive care refers to lifestyle choices and visits with your health care provider that can promote health and wellness. What does preventive care include? A yearly physical exam. This is also called an annual well check. Dental exams once or twice a year. Routine eye exams. Ask your health care provider how often you should have your eyes checked. Personal lifestyle choices, including: Daily care of your teeth and gums. Regular physical activity. Eating a healthy diet. Avoiding tobacco and drug use. Limiting alcohol use. Practicing safe sex. Taking low doses of  aspirin every day. Taking vitamin and mineral supplements as recommended by your health care provider. What happens during an annual well check? The services and screenings done by your health care provider during your annual well check will depend on your age, overall health, lifestyle risk factors, and family history of disease. Counseling  Your health care provider may ask you questions about your: Alcohol use. Tobacco use. Drug use. Emotional well-being. Home and relationship well-being. Sexual activity. Eating habits. History of falls. Memory and ability to understand (cognition). Work and work Statistician. Screening  You may have the following tests or measurements: Height, weight, and BMI. Blood pressure. Lipid and cholesterol levels. These may be checked every 5 years, or more frequently if you are over 68 years old. Skin check. Lung cancer screening. You may have this screening every year starting at age 41 if you have a 30-pack-year history of smoking and currently smoke or have quit within the past 15 years. Fecal occult blood test (FOBT) of the stool. You may have this test every year starting at age 29. Flexible sigmoidoscopy or colonoscopy. You may have a sigmoidoscopy every 5 years or a colonoscopy every 10 years starting at age 71. Prostate cancer screening. Recommendations will vary depending on your family history and other risks. Hepatitis C blood test. Hepatitis B blood test. Sexually transmitted disease (STD) testing. Diabetes screening. This is done by checking your blood sugar (  glucose) after you have not eaten for a while (fasting). You may have this done every 1-3 years. Abdominal aortic aneurysm (AAA) screening. You may need this if you are a current or former smoker. Osteoporosis. You may be screened starting at age 32 if you are at high risk. Talk with your health care provider about your test results, treatment options, and if necessary, the need for more  tests. Vaccines  Your health care provider may recommend certain vaccines, such as: Influenza vaccine. This is recommended every year. Tetanus, diphtheria, and acellular pertussis (Tdap, Td) vaccine. You may need a Td booster every 10 years. Zoster vaccine. You may need this after age 25. Pneumococcal 13-valent conjugate (PCV13) vaccine. One dose is recommended after age 71. Pneumococcal polysaccharide (PPSV23) vaccine. One dose is recommended after age 71. Talk to your health care provider about which screenings and vaccines you need and how often you need them. This information is not intended to replace advice given to you by your health care provider. Make sure you discuss any questions you have with your health care provider. Document Released: 04/09/2015 Document Revised: 12/01/2015 Document Reviewed: 01/12/2015 Elsevier Interactive Patient Education  2017 [place] Prevention in the Home Falls can cause injuries. They can happen to people of all ages. There are many things you can do to make your home safe and to help prevent falls. What can I do on the outside of my home? Regularly fix the edges of walkways and driveways and fix any cracks. Remove anything that might make you trip as you walk through a door, such as a raised step or threshold. Trim any bushes or trees on the path to your home. Use bright outdoor lighting. Clear any walking paths of anything that might make someone trip, such as rocks or tools. Regularly check to see if handrails are loose or broken. Make sure that both sides of any steps have handrails. Any raised decks and porches should have guardrails on the edges. Have any leaves, snow, or ice cleared regularly. Use sand or salt on walking paths during winter. Clean up any spills in your garage right away. This includes oil or grease spills. What can I do in the bathroom? Use night lights. Install grab bars by the toilet and in the tub and shower.  Do not use towel bars as grab bars. Use non-skid mats or decals in the tub or shower. If you need to sit down in the shower, use a plastic, non-slip stool. Keep the floor dry. Clean up any water that spills on the floor as soon as it happens. Remove soap buildup in the tub or shower regularly. Attach bath mats securely with double-sided non-slip rug tape. Do not have throw rugs and other things on the floor that can make you trip. What can I do in the bedroom? Use night lights. Make sure that you have a light by your bed that is easy to reach. Do not use any sheets or blankets that are too big for your bed. They should not hang down onto the floor. Have a firm chair that has side arms. You can use this for support while you get dressed. Do not have throw rugs and other things on the floor that can make you trip. What can I do in the kitchen? Clean up any spills right away. Avoid walking on wet floors. Keep items that you use a lot in easy-to-reach places. If you need to reach something above you, use  a strong step stool that has a grab bar. Keep electrical cords out of the way. Do not use floor polish or wax that makes floors slippery. If you must use wax, use non-skid floor wax. Do not have throw rugs and other things on the floor that can make you trip. What can I do with my stairs? Do not leave any items on the stairs. Make sure that there are handrails on both sides of the stairs and use them. Fix handrails that are broken or loose. Make sure that handrails are as long as the stairways. Check any carpeting to make sure that it is firmly attached to the stairs. Fix any carpet that is loose or worn. Avoid having throw rugs at the top or bottom of the stairs. If you do have throw rugs, attach them to the floor with carpet tape. Make sure that you have a light switch at the top of the stairs and the bottom of the stairs. If you do not have them, ask someone to add them for you. What else  can I do to help prevent falls? Wear shoes that: Do not have high heels. Have rubber bottoms. Are comfortable and fit you well. Are closed at the toe. Do not wear sandals. If you use a stepladder: Make sure that it is fully opened. Do not climb a closed stepladder. Make sure that both sides of the stepladder are locked into place. Ask someone to hold it for you, if possible. Clearly mark and make sure that you can see: Any grab bars or handrails. First and last steps. Where the edge of each step is. Use tools that help you move around (mobility aids) if they are needed. These include: Canes. Walkers. Scooters. Crutches. Turn on the lights when you go into a dark area. Replace any light bulbs as soon as they burn out. Set up your furniture so you have a clear path. Avoid moving your furniture around. If any of your floors are uneven, fix them. If there are any pets around you, be aware of where they are. Review your medicines with your doctor. Some medicines can make you feel dizzy. This can increase your chance of falling. Ask your doctor what other things that you can do to help prevent falls. This information is not intended to replace advice given to you by your health care provider. Make sure you discuss any questions you have with your health care provider. Document Released: 01/07/2009 Document Revised: 08/19/2015 Document Reviewed: 04/17/2014 Elsevier Interactive Patient Education  2017 [employer].

## 2022-04-03 NOTE — Patient Instructions (Signed)
Thank you for coming to see me today. It was a pleasure seeing you.  To Do: Refills sent on amoxicillin 500mg  twice daily Follow up in 6 months  If you have any questions or concerns, please do not hesitate to call the office at [phone].  Take Care,   Jule Ser

## 2022-04-03 NOTE — Progress Notes (Signed)
[facility]  CHIEF COMPLAINT:    Follow up for chronic PJI  SUBJECTIVE:    Harold Robertson is a 72 y.o. male with PMHx as below who presents to the clinic for chronic PJI.   Patient presents today for routine follow up regarding right knee PJI.  He is status post I&D and poly exchange on 03/22/21 with Dr Eulah Pont.  OR cultures grew GBS and he was treated with ceftriaxone via PICC line x 6 weeks (end date 05/03/21).  He was then transitioned to amoxicillin 500mg  TID PO which continues to take TID.  He was last seen in August 2023 and inflammatory markers at that time were normalized.   Today, reports doing well on amoxicillin and is taking 500mg  BID with no side effects.  His knee is doing well and no concerns for relapsed infection.  Please see A&P for the details of today's visit and status of the patient's medical problems.   Patient's Medications  New Prescriptions   AMOXICILLIN (AMOXIL) 500 MG CAPSULE    Take 1 capsule (500 mg total) by mouth 2 (two) times daily.  Previous Medications   ACETAMINOPHEN (TYLENOL) 500 MG TABLET    Take 2 tablets (1,000 mg total) by mouth every 6 (six) hours as needed for mild pain or moderate pain.   ATORVASTATIN (LIPITOR) 40 MG TABLET    TAKE 1 TABLET BY MOUTH EVERY DAY AT 6PM   CARVEDILOL (COREG) 12.5 MG TABLET    TAKE 1 TABLET BY MOUTH TWICE A DAY   CHOLECALCIFEROL (VITAMIN D) 25 MCG (1000 UNIT) TABLET    Take 1,000 Units by mouth daily.   ELIQUIS 5 MG TABS TABLET    TAKE 1 TABLET BY MOUTH TWICE A DAY   LISINOPRIL (ZESTRIL) 40 MG TABLET    TAKE 1 TABLET BY MOUTH EVERY DAY  Modified Medications   No medications on file  Discontinued Medications   No medications on file      Past Medical History:  Diagnosis Date   Arthritis    Atrial fibrillation (HCC)    Complete heart block (HCC)    Hypertension    OSA on CPAP    Presence of permanent cardiac pacemaker     Social History   Tobacco Use   Smoking  status: Former    Types: Cigars   Smokeless tobacco: Current    Types: Snuff   Tobacco comments:    occ  Vaping Use   Vaping Use: Never used  Substance Use Topics   Alcohol use: Yes    Alcohol/week: 1.0 standard drink of alcohol    Types: 1 Cans of beer per week    Comment: occ   Drug use: No    Family History  Problem Relation Age of Onset   Heart disease Father 70       AMI   Liver cancer Mother    Cancer Mother 19       Colon cancer with liver mets   Heart disease Brother 39       AMI   Heart disease Maternal Grandmother     No Active Allergies  Review of Systems  All other systems reviewed and are negative.    OBJECTIVE:    Vitals:   04/03/22 0829  BP: (!) 147/91  Pulse: 65  Temp: 98.1 F (36.7 C)  TempSrc: Temporal  SpO2: 97%  Weight: (!) 315 lb (142.9 kg)  Height: 5\' 11"  (1.803  m)   Body mass index is 43.93 kg/m.  Physical Exam Constitutional:      Appearance: Normal appearance.  HENT:     Head: Normocephalic and atraumatic.  Musculoskeletal:     Right lower leg: No edema.     Left lower leg: No edema.  Skin:    General: Skin is warm and dry.  Neurological:     General: No focal deficit present.     Mental Status: He is alert and oriented to person, place, and time.      Labs and Microbiology:    Latest Ref Rng & Units 01/24/2022    8:35 AM 11/01/2021    9:05 AM 08/02/2021    9:02 AM  CBC  WBC 3.4 - 10.8 x10E3/uL 6.0  4.2  5.6   Hemoglobin 13.0 - 17.7 g/dL 15.6  14.7  15.2   Hematocrit 37.5 - 51.0 % 45.5  43.4  44.8   Platelets 150 - 450 x10E3/uL 173  137  151       Latest Ref Rng & Units 01/24/2022    8:35 AM 11/01/2021    9:05 AM 08/02/2021    9:02 AM  CMP  Glucose 70 - 99 mg/dL 110  105  113   BUN 8 - 27 mg/dL 10  12  10    Creatinine 0.76 - 1.27 mg/dL 1.06  0.90  0.98   Sodium 134 - 144 mmol/L 141  141  142   Potassium 3.5 - 5.2 mmol/L 4.5  4.1  4.1   Chloride 96 - 106 mmol/L 104  108  109   CO2 20 - 29 mmol/L 29  27  26     Calcium 8.6 - 10.2 mg/dL 9.4  9.0  9.0      No results found for this or any previous visit (from the past 240 hour(s)).  Imaging:    ASSESSMENT & PLAN:    Infection of total right knee replacement ([place]) Patient with 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  Completed 6 weeks of IV antibiotics on 05/03/21 and transitioned to amoxicillin suppression 500mg  TID since that time which he continues to take through today although has only been taking BID with no adverse effect. He continues to do well without any signs or symptoms of relapsed infection. Discussed with patient there is no definitive test of cure to determine when all infection has been eradicated other than to stop therapy and observe.  Discussed risk vs benefits and what would be associated with relapsed infection such as another surgery.  Will continue antibiotics for now.  Labs on 01/24/22 were WNL so will not repeat today.  RTC 6 months.      [facility] 04/03/2022, 8:57 AM

## 2022-04-03 NOTE — Assessment & Plan Note (Signed)
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  Completed 6 weeks of IV antibiotics on 05/03/21 and transitioned to amoxicillin suppression 500mg  TID since that time which he continues to take through today although has only been taking BID with no adverse effect. He continues to do well without any signs or symptoms of relapsed infection. Discussed with patient there is no definitive test of cure to determine when all infection has been eradicated other than to stop therapy and observe.  Discussed risk vs benefits and what would be associated with relapsed infection such as another surgery.  Will continue antibiotics for now.  Labs on 01/24/22 were WNL so will not repeat today.  RTC 6 months.

## 2022-04-03 NOTE — Progress Notes (Unsigned)
Subjective:   Harold Robertson is a 72 y.o. male who presents for Medicare Annual/Subsequent preventive examination.  Review of Systems    ***       Objective:    There were no vitals filed for this visit. There is no height or weight on file to calculate BMI.     02/02/2022    2:04 PM 03/22/2021    6:26 PM 03/17/2021   10:09 AM 03/01/2021    3:56 PM 01/28/2020   10:02 AM 03/15/2018   11:51 AM 03/14/2018    9:59 AM  Advanced Directives  Does Patient Have a Medical Advance Directive? Yes No No No Yes No Yes  Type of Advance Directive Living will    Living will;Healthcare Power of Attorney    Does patient want to make changes to medical advance directive? No - Patient declined        Copy of [place] in Chart?     No - copy requested    Would patient like information on creating a medical advance directive?  No - Patient declined         Current Medications (verified) Outpatient Encounter Medications as of 04/04/2022  Medication Sig   acetaminophen (TYLENOL) 500 MG tablet Take 2 tablets (1,000 mg total) by mouth every 6 (six) hours as needed for mild pain or moderate pain. (Patient taking differently: Take 1,000 mg by mouth every 8 (eight) hours as needed for mild pain or moderate pain.)   amoxicillin (AMOXIL) 500 MG capsule Take 1 capsule (500 mg total) by mouth 2 (two) times daily.   atorvastatin (LIPITOR) 40 MG tablet TAKE 1 TABLET BY MOUTH EVERY DAY AT 6PM   carvedilol (COREG) 12.5 MG tablet TAKE 1 TABLET BY MOUTH TWICE A DAY   cholecalciferol (VITAMIN D) 25 MCG (1000 UNIT) tablet Take 1,000 Units by mouth daily.   ELIQUIS 5 MG TABS tablet TAKE 1 TABLET BY MOUTH TWICE A DAY   lisinopril (ZESTRIL) 40 MG tablet TAKE 1 TABLET BY MOUTH EVERY DAY   No facility-administered encounter medications on file as of 04/04/2022.    Allergies (verified) Patient has no active allergies.   History: Past Medical History:  Diagnosis Date   Arthritis    Atrial  fibrillation ([place])    Complete heart block ([facility])    Hypertension    OSA on CPAP    Presence of permanent cardiac pacemaker    Past Surgical History:  Procedure Laterality Date   APPENDECTOMY  1960s   CARDIAC CATHETERIZATION Right 09/08/2015   Procedure: Temporary Pacemaker;  Surgeon: Will [name], MD;  Location: [place] [facility];  Service: Cardiovascular;  Laterality: Right;   CARDIAC CATHETERIZATION N/A 09/09/2015   Procedure: Left Heart Cath and Coronary Angiography;  Surgeon: [name], MD;  Location: [place] [facility];  Service: Cardiovascular;  Laterality: N/A;   CARDIOVERSION N/A 01/28/2020   Procedure: CARDIOVERSION;  Surgeon: [name], MD;  Location: [facility];  Service: Cardiovascular;  Laterality: N/A;   CHEILECTOMY Left 03/15/2018   Procedure: CHEILECTOMY LEFT FOOT;  Surgeon: [name], MD;  Location: [place];  Service: Orthopedics;  Laterality: Left;   COLONOSCOPY     CYSTOSCOPY N/A 03/22/2021   Procedure: CYSTOSCOPY FLEXIBLE; ATTEMPTED FOLEY INSERTION;  Surgeon: [name], MD;  Location: [facility];  Service: Orthopedics;  Laterality: N/A;   EP IMPLANTABLE DEVICE N/A 09/09/2015   Procedure: BiV Pacemaker Insertion CRT-P;  Surgeon: Will Hassell Done  Camnitz, MD;  Location: [place] [facility];  Service: Cardiovascular;  Laterality: N/A;   KNEE SURGERY Right    PPM GENERATOR CHANGEOUT N/A 02/02/2022   Procedure: PPM GENERATOR CHANGEOUT;  Surgeon: [name], MD;  Location: [place] [facility];  Service: Cardiovascular;  Laterality: N/A;   TOTAL KNEE ARTHROPLASTY Right 07/11/2016   TOTAL KNEE ARTHROPLASTY Right 07/11/2016   Procedure: TOTAL KNEE ARTHROPLASTY;  Surgeon: [name], MD;  Location: [place];  Service: Orthopedics;  Laterality: Right;   TOTAL KNEE ARTHROPLASTY WITH REVISION COMPONENTS Right 03/22/2021   Procedure: TOTAL KNEE ARTHROPLASTY WITH REVISION COMPONENTS (POLY EXCHANGE); I&D;  Surgeon: [name],  MD;  Location: [facility];  Service: Orthopedics;  Laterality: Right;   Family History  Problem Relation Age of Onset   Heart disease Father 16       AMI   Liver cancer Mother    Cancer Mother 89       Colon cancer with liver mets   Heart disease Brother 18       AMI   Heart disease Maternal Grandmother    Social History   Socioeconomic History   Marital status: Married    Spouse name: Not on file   Number of children: Not on file   Years of education: Not on file   Highest education level: Not on file  Occupational History   Occupation: truck driver  Tobacco Use   Smoking status: Former    Types: Cigars   Smokeless tobacco: Current    Types: Snuff   Tobacco comments:    occ  Vaping Use   Vaping Use: Never used  Substance and Sexual Activity   Alcohol use: Yes    Alcohol/week: 1.0 standard drink of alcohol    Types: 1 Cans of beer per week    Comment: occ   Drug use: No   Sexual activity: Yes  Other Topics Concern   Not on file  Social History Narrative   Marital status: Married x 30 years; first marriage     Children: 2 children; no grandchildren     Lives: lives with wife      Employment: truck driver x 14 years; [place]        Tobacco:  None; pipes and cigars; chews tobacco.       Alcohol:  Weekends.      Drugs:  None      Exercise:  Rarely.      Seatbelt:  100%      Guns:  Loaded and secured.   Social Determinants of Health   Financial Resource Strain: Not on file  Food Insecurity: Not on file  Transportation Needs: Not on file  Physical Activity: Not on file  Stress: Not on file  Social Connections: Not on file    Tobacco Counseling Ready to quit: Not Answered Counseling given: Not Answered Tobacco comments: occ   Clinical Intake:              How often do you need to have someone help you when you read instructions, pamphlets, or other written materials from your doctor or pharmacy?: (P) 1 - Never  Diabetic?No           Activities of Daily Living    04/03/2022    3:26 PM 02/02/2022    2:03 PM  In your present state of health, do you have any difficulty performing the following activities:  Hearing? 1 1  Vision? 0 0  Difficulty concentrating or making decisions? 0 0  Walking or climbing stairs? 0 0  Dressing or bathing? 0 0  Doing errands, shopping? 0   Preparing Food and eating ? N   Using the Toilet? N   In the past six months, have you accidently leaked urine? N   Do you have problems with loss of bowel control? N   Managing your Medications? N   Managing your Finances? N   Housekeeping or managing your Housekeeping? N     Patient Care Team: Donita Brooks, MD as PCP - General (Family Medicine) Chilton Si, MD as PCP - Cardiology (Cardiology) Regan Lemming, MD as PCP - Electrophysiology (Cardiology)  Indicate any recent Medical Services you may have received from other than [facility] providers in the past year (date may be approximate).     Assessment:   This is a routine wellness examination for Harold Robertson.  Hearing/Vision screen No results found.  Dietary issues and exercise activities discussed:     Goals Addressed   None    Depression Screen    04/03/2022    8:30 AM 11/01/2021    8:43 AM 08/02/2021    8:33 AM 04/13/2021    2:53 PM 03/17/2021   11:08 AM 05/19/2019    8:16 AM 05/06/2018    8:19 AM  PHQ 2/9 Scores  PHQ - 2 Score 0 0 0 0 0 0 0  PHQ- 9 Score     0      Fall Risk    04/03/2022    3:26 PM 04/03/2022    8:30 AM 11/01/2021    8:43 AM 08/02/2021    8:33 AM 04/28/2021    3:28 PM  Fall Risk   Falls in the past year? 0 0 0 0 0  Number falls in past yr:  0 0 0 0  Injury with Fall?  0 0 0 0  Risk for fall due to :  No Fall Risks  No Fall Risks Impaired mobility;Impaired balance/gait  Follow up  Falls evaluation completed  Falls evaluation completed Falls evaluation completed    FALL RISK PREVENTION PERTAINING TO THE HOME:  Any stairs in or around the home?  {YES/NO:21197} If so, are there any without handrails? {YES/NO:21197} Home free of loose throw rugs in walkways, pet beds, electrical cords, etc? {YES/NO:21197} Adequate lighting in your home to reduce risk of falls? {YES/NO:21197}  ASSISTIVE DEVICES UTILIZED TO PREVENT FALLS:  Life alert? {YES/NO:21197} Use of a cane, walker or w/c? {YES/NO:21197} Grab bars in the bathroom? {YES/NO:21197} Shower chair or bench in shower? {YES/NO:21197} Elevated toilet seat or a handicapped toilet? {YES/NO:21197}  TIMED UP AND GO:  Was the test performed? {YES/NO:21197}.  Length of time to ambulate 10 feet: *** sec.   {Appearance of [id number]  Cognitive Function:        Immunizations Immunization History  Administered Date(s) Administered   Fluad Quad(high Dose 65+) 12/04/2018   Influenza Split 01/31/2013, 03/17/2015   Influenza, High Dose Seasonal PF 12/18/2017   Influenza,inj,Quad PF,6+ Mos 01/14/2016, 11/16/2016   Influenza-Unspecified 01/02/2014   PFIZER(Purple Top)SARS-COV-2 Vaccination 04/15/2019, 05/06/2019   Pneumococcal Conjugate-13 11/04/2018   Pneumococcal Polysaccharide-23 01/28/2018   Tdap 01/14/2016, 05/11/2016    {TDAP status:2101805}  {Flu Vaccine status:2101806}  {Pneumococcal vaccine status:2101807}  {Covid-19 vaccine status:2101808}  Qualifies for Shingles Vaccine? {YES/NO:21197}  Zostavax completed {YES/NO:21197}  {Shingrix Completed?:2101804}  Screening Tests Health Maintenance  Topic Date Due   Zoster Vaccines- Shingrix (1 of 2) Never done  COVID-19 Vaccine (3 - Pfizer risk series) 06/03/2019   INFLUENZA VACCINE  10/25/2021   Medicare Annual Wellness (AWV)  03/17/2022   COLONOSCOPY (Pts 45-32yrs Insurance coverage will need to be confirmed)  03/10/2025   DTaP/Tdap/Td (3 - Td or Tdap) 05/11/2026   Pneumonia Vaccine 51+ Years old  Completed   Hepatitis C Screening  Completed   HPV VACCINES  Aged Out    Health Maintenance  Health Maintenance  Due  Topic Date Due   Zoster Vaccines- Shingrix (1 of 2) Never done   COVID-19 Vaccine (3 - Pfizer risk series) 06/03/2019   INFLUENZA VACCINE  10/25/2021   Medicare Annual Wellness (AWV)  03/17/2022    {Colorectal cancer screening:2101809}  Lung Cancer Screening: (Low Dose CT Chest recommended if Age 31-80 years, 30 pack-year currently smoking OR have quit w/in 15years.) {DOES NOT does:27190::"does not"} qualify.   Lung Cancer Screening Referral: ***  Additional Screening:  Hepatitis C Screening: {DOES NOT does:27190::"does not"} qualify; Completed ***  Vision Screening: Recommended annual ophthalmology exams for early detection of glaucoma and other disorders of the eye. Is the patient up to date with their annual eye exam?  {YES/NO:21197} Who is the provider or what is the name of the office in which the patient attends annual eye exams? *** If pt is not established with a provider, would they like to be referred to a provider to establish care? {YES/NO:21197}.   Dental Screening: Recommended annual dental exams for proper oral hygiene  Community Resource Referral / Chronic Care Management: CRR required this visit?  {YES/NO:21197}  CCM required this visit?  {YES/NO:21197}     Plan:     I have personally reviewed and noted the following in the patient's chart:   Medical and social history Use of alcohol, tobacco or illicit drugs  Current medications and supplements including opioid prescriptions. {Opioid [phone]} Functional ability and status Nutritional status Physical activity Advanced directives List of other physicians Hospitalizations, surgeries, and ER visits in previous 12 months Vitals Screenings to include cognitive, depression, and falls Referrals and appointments  In addition, I have reviewed and discussed with patient certain preventive protocols, quality metrics, and best practice recommendations. A written personalized care plan for  preventive services as well as general preventive health recommendations were provided to patient.     Denman George [place], [place]   [id number]   Nurse Notes: ***

## 2022-04-04 ENCOUNTER — Ambulatory Visit (INDEPENDENT_AMBULATORY_CARE_PROVIDER_SITE_OTHER): Payer: Medicare Other

## 2022-04-04 VITALS — BP 124/68 | Ht 71.0 in | Wt 313.0 lb

## 2022-04-04 DIAGNOSIS — H9193 Unspecified hearing loss, bilateral: Secondary | ICD-10-CM

## 2022-04-04 DIAGNOSIS — Z0001 Encounter for general adult medical examination with abnormal findings: Secondary | ICD-10-CM | POA: Diagnosis not present

## 2022-04-04 DIAGNOSIS — Z23 Encounter for immunization: Secondary | ICD-10-CM

## 2022-04-04 DIAGNOSIS — Z0000 Encounter for general adult medical examination without abnormal findings: Secondary | ICD-10-CM

## 2022-04-11 ENCOUNTER — Other Ambulatory Visit: Payer: Self-pay

## 2022-04-11 DIAGNOSIS — Z79899 Other long term (current) drug therapy: Secondary | ICD-10-CM

## 2022-04-14 ENCOUNTER — Telehealth: Payer: Self-pay | Admitting: Pharmacist

## 2022-04-14 NOTE — Progress Notes (Signed)
Care Management & Coordination Services Pharmacy Team  Reason for Encounter: Appointment Reminder  Contacted patient on 04/18/2022   Recent office visits:  None  Recent consult visits:  04/03/2022 OV (Inf Disease) Mignon Pine, DO; will continue antibiotics for now.  11/01/2021 OV (Inf Disease) Mignon Pine, DO; will continue antibiotics for now.  Hospital visits:  02/02/2022 Admission for [facility] Pacemaker generator change implant as a surgical intervention .   Medications: Outpatient Encounter Medications as of 04/14/2022  Medication Sig Note   acetaminophen (TYLENOL) 500 MG tablet Take 2 tablets (1,000 mg total) by mouth every 6 (six) hours as needed for mild pain or moderate pain. (Patient taking differently: Take 1,000 mg by mouth every 8 (eight) hours as needed for mild pain or moderate pain.)    amoxicillin (AMOXIL) 500 MG capsule Take 1 capsule (500 mg total) by mouth 2 (two) times daily.    atorvastatin (LIPITOR) 40 MG tablet TAKE 1 TABLET BY MOUTH EVERY DAY AT 6PM    carvedilol (COREG) 12.5 MG tablet TAKE 1 TABLET BY MOUTH TWICE A DAY    cholecalciferol (VITAMIN D) 25 MCG (1000 UNIT) tablet Take 1,000 Units by mouth daily.    ELIQUIS 5 MG TABS tablet TAKE 1 TABLET BY MOUTH TWICE A DAY 01/31/2022: On hold for procedure   lisinopril (ZESTRIL) 40 MG tablet TAKE 1 TABLET BY MOUTH EVERY DAY    No facility-administered encounter medications on file as of 04/14/2022.   Lab Results  Component Value Date/Time   HGBA1C 6.2 (H) 03/23/2021 03:21 AM   HGBA1C 5.7 (H) 08/03/2014 02:09 PM    BP Readings from Last 3 Encounters:  04/04/22 124/68  04/03/22 (!) 147/91  02/02/22 (!) 130/93     Patient contacted to confirm in office appointment with Leata Mouse PharmD, on 04/20/2022 at 10 am.  Do you have any problems getting your medications? No If yes what types of problems are you experiencing?   What is your top health concern you would like to discuss at your upcoming visit?  Patient states he is overweight and would like to lose weight.  Have you seen any other providers since your last visit with PCP? No    Star Rating Drugs:  Atorvastatin 40 mg last filled 03/23/2022 90 DS Lisinopril 40 mg last filled 04/14/2022 90 DS   Care Gaps: Annual wellness visit in last year? Yes    Future Appointments  Date Time Provider [place]  04/20/2022 10:00 AM Edythe Clarity, [place] None  05/08/2022  7:10 AM [facility] DEVICE REMOTES [facility]  05/08/2022  1:45 PM [name], MD [facility]  08/07/2022  7:10 AM [facility] DEVICE REMOTES [facility]  10/09/2022  8:45 AM Mignon Pine, DO [facility]  11/06/2022  7:10 AM [facility] DEVICE REMOTES [facility]  02/05/2023  7:10 AM [facility] DEVICE REMOTES [facility]  05/07/2023  7:10 AM [facility] DEVICE REMOTES [facility]  08/06/2023  7:10 AM [facility] DEVICE REMOTES [facility]  11/05/2023  7:10 AM [facility] DEVICE REMOTES [facility]  02/04/2024  7:10 AM [facility] DEVICE REMOTES [facility]   April D Calhoun, [place] Pharmacist Assistant [phone]'

## 2022-04-14 NOTE — Progress Notes (Signed)
Care Management & Coordination Services Pharmacy Note  04/20/2022 Name:  Harold Robertson MRN:  [id number] DOB:  22-Oct-1950  Summary: PharmD initial visit.  Patient doing very well managing medications.  His main concern today was gaining weight.  Has access to bike at home just has not started to ride.  Also concerned with Eliquis copay.    Recommendations/Changes made from today's visit: Provided PAP for Eliquis Recommended increased physical activity Due for updated labs - schedule physical  Follow up plan: 6 months with PharmD CMA to check in 3 to see if he scheduled physical   Subjective: Harold Robertson is an 72 y.o. year old male who is a primary patient of Pickard, Cammie Mcgee, MD.  The care coordination team was consulted for assistance with disease management and care coordination needs.    Engaged with patient by telephone for initial visit.  Recent office visits:  None   Recent consult visits:  04/03/2022 OV (Inf Disease) Mignon Pine, DO; will continue antibiotics for now.   11/01/2021 OV (Inf Disease) Mignon Pine, DO; will continue antibiotics for now.   Hospital visits:  02/02/2022 Admission for [facility] Pacemaker generator change implant as a surgical intervention .    Objective:  Lab Results  Component Value Date   CREATININE 1.06 01/24/2022   BUN 10 01/24/2022   EGFR 75 01/24/2022   GFRNONAA >60 03/23/2021   GFRAA >60 12/29/2019   NA 141 01/24/2022   K 4.5 01/24/2022   CALCIUM 9.4 01/24/2022   CO2 29 01/24/2022   GLUCOSE 110 (H) 01/24/2022    Lab Results  Component Value Date/Time   HGBA1C 6.2 (H) 03/23/2021 03:21 AM   HGBA1C 5.7 (H) 08/03/2014 02:09 PM    Last diabetic Eye exam: No results found for: "HMDIABEYEEXA"  Last diabetic Foot exam: No results found for: "HMDIABFOOTEX"   Lab Results  Component Value Date   CHOL 92 03/17/2021   HDL 26 (L) 03/17/2021   LDLCALC 51 03/17/2021   TRIG 73 03/17/2021   CHOLHDL 3.5 03/17/2021        Latest Ref Rng & Units 03/23/2021    8:48 AM 03/22/2021    7:17 PM 03/17/2021   11:41 AM  Hepatic Function  Total Protein 6.5 - 8.1 g/dL 7.0  7.1  6.5   Albumin 3.5 - 5.0 g/dL 2.4  2.4    AST 15 - 41 U/L 35  49  41   ALT 0 - 44 U/L 62  78  80   Alk Phosphatase 38 - 126 U/L 65  66    Total Bilirubin 0.3 - 1.2 mg/dL 0.9  1.1  1.4     Lab Results  Component Value Date/Time   TSH 0.949 12/29/2019 02:37 PM   TSH 4.45 05/11/2016 09:59 AM   TSH 2.347 09/08/2015 08:34 PM   TSH 1.249 08/03/2014 02:09 PM       Latest Ref Rng & Units 01/24/2022    8:35 AM 11/01/2021    9:05 AM 08/02/2021    9:02 AM  CBC  WBC 3.4 - 10.8 x10E3/uL 6.0  4.2  5.6   Hemoglobin 13.0 - 17.7 g/dL 15.6  14.7  15.2   Hematocrit 37.5 - 51.0 % 45.5  43.4  44.8   Platelets 150 - 450 x10E3/uL 173  137  151     Lab Results  Component Value Date/Time   VD25OH 22 (L) 05/11/2016 09:59 AM    Clinical ASCVD: Yes  The ASCVD Risk  score (Arnett DK, et al., 2019) failed to calculate for the following reasons:   The valid total cholesterol range is 130 to 320 mg/dL        04/04/2022   10:01 AM 04/03/2022    8:30 AM 11/01/2021    8:43 AM  Depression screen PHQ 2/9  Decreased Interest 0 0 0  Down, Depressed, Hopeless 0 0 0  PHQ - 2 Score 0 0 0     Social History   Tobacco Use  Smoking Status Former   Types: Cigars  Smokeless Tobacco Current   Types: Snuff  Tobacco Comments   occ   BP Readings from Last 3 Encounters:  04/04/22 124/68  04/03/22 (!) 147/91  02/02/22 (!) 130/93   Pulse Readings from Last 3 Encounters:  04/03/22 65  02/02/22 69  01/05/22 (!) 55   Wt Readings from Last 3 Encounters:  04/04/22 (!) 313 lb (142 kg)  04/03/22 (!) 315 lb (142.9 kg)  02/02/22 295 lb (133.8 kg)   BMI Readings from Last 3 Encounters:  04/04/22 43.65 kg/m  04/03/22 43.93 kg/m  02/02/22 41.14 kg/m    No Active Allergies  Medications Reviewed Today     Reviewed by Bernita Raisin, LPN (Licensed  Practical Nurse) on 04/04/22 at [phone]  Med List Status: <None>   Medication Order Taking? Sig Documenting Provider Last Dose Status Informant  acetaminophen (TYLENOL) 500 MG tablet [id number] Yes Take 2 tablets (1,000 mg total) by mouth every 6 (six) hours as needed for mild pain or moderate pain.  Patient taking differently: Take 1,000 mg by mouth every 8 (eight) hours as needed for mild pain or moderate pain.   Aggie Moats M, PA-C Taking Active Self  amoxicillin (AMOXIL) 500 MG capsule [id number] Yes Take 1 capsule (500 mg total) by mouth 2 (two) times daily. Mignon Pine, DO Taking Active   atorvastatin (LIPITOR) 40 MG tablet [id number] Yes TAKE 1 TABLET BY MOUTH EVERY DAY AT Iline Oven, MD Taking Active Self  carvedilol (COREG) 12.5 MG tablet [id number] Yes TAKE 1 TABLET BY MOUTH TWICE A DAY Camnitz, Will Hassell Done, MD Taking Active Self  cholecalciferol (VITAMIN D) 25 MCG (1000 UNIT) tablet [id number] Yes Take 1,000 Units by mouth daily. [provider] Taking Active Self  ELIQUIS 5 MG TABS tablet [id number] Yes TAKE 1 TABLET BY MOUTH TWICE A DAY Skeet Latch, MD Taking Active Self           Med Note Gentry Roch   Tue Jan 31, 2022 11:09 AM) On hold for procedure  lisinopril (ZESTRIL) 40 MG tablet [id number] Yes TAKE 1 TABLET BY MOUTH EVERY DAY Camnitz, Ocie Doyne, MD Taking Active             SDOH:  (Social Determinants of Health) assessments and interventions performed:  Financial Resource Strain: Low Risk  (04/20/2022)   Overall Financial Resource Strain (CARDIA)    Difficulty of Paying Living Expenses: Not hard at all   Food Insecurity: No Food Insecurity (04/20/2022)   Hunger Vital Sign    Worried About Running Out of Food in the Last Year: Never true    Ran Out of Food in the Last Year: Never true     SDOH Interventions    Flowsheet Row Clinical Support from 04/04/2022 in [place] Interventions   Food  Insecurity Interventions Intervention Not Indicated  Housing Interventions Intervention Not Indicated  Transportation Interventions Intervention Not Indicated  Utilities Interventions  Intervention Not Indicated  Alcohol Usage Interventions Intervention Not Indicated (Score <7)  Financial Strain Interventions Intervention Not Indicated  Physical Activity Interventions Intervention Not Indicated  Stress Interventions Intervention Not Indicated  Social Connections Interventions Intervention Not Indicated      SDOH Screenings   Food Insecurity: No Food Insecurity (04/20/2022)  Housing: Low Risk  (04/04/2022)  Transportation Needs: No Transportation Needs (04/04/2022)  Utilities: Not At Risk (04/04/2022)  Alcohol Screen: Low Risk  (04/04/2022)  Depression (PHQ2-9): Low Risk  (04/04/2022)  Financial Resource Strain: Low Risk  (04/20/2022)  Physical Activity: Insufficiently Active (04/04/2022)  Social Connections: Socially Integrated (04/04/2022)  Stress: No Stress Concern Present (04/04/2022)  Tobacco Use: High Risk (04/04/2022)    Medication Assistance: None required.  Patient affirms current coverage meets needs.  Medication Access: Within the past 30 days, how often has patient missed a dose of medication? 0 Is a pillbox or other method used to improve adherence? Yes  Factors that may affect medication adherence? no barriers identified Are meds synced by current pharmacy? No  Are meds delivered by current pharmacy? No  Does patient experience delays in picking up medications due to transportation concerns? No   [facility] Services Reviewed: Is patient disadvantaged to use [facility]?: Yes  Current Rx insurance plan: [employer] Name and location of Current pharmacy:  [id number] [place], [place] - [address] [facility] [address] [place] [address] Phone: [phone] Fax: [phone]  [facility] services reviewed with patient today?: Yes   Patient requests to transfer care to [facility]?: No  Reason patient declined to change pharmacies: Disadvantaged due to insurance/mail order  Compliance/Adherence/Medication fill history: Care Gaps: None  Star-Rating Drugs: Atorvastatin 40 mg last filled 03/23/2022 90 DS Lisinopril 40 mg last filled 04/14/2022 90 DS   Assessment/Plan   Hypertension (BP goal <140/90) -Controlled -Current treatment: Lisinopril 40mg  Appropriate, Effective, Safe, Accessible Carvedilol 12.5mg  BID Appropriate, Effective, Safe, Accessible -Medications previously tried: none noted  -Current home readings: checks BP once daily at least at home -Current dietary habits:  -Current exercise habits: minimal, he has a bike at home but does not ride it currently -Denies hypotensive/hypertensive symptoms -Educated on BP goals and benefits of medications for prevention of heart attack, stroke and kidney damage; Exercise goal of 150 minutes per week; Importance of home blood pressure monitoring; -Counseled to monitor BP at home as current, document, and provide log at future appointments -Counseled on diet and exercise extensively Recommended to continue current medication Increase physical activity and work your way up to the recommended 150 mins/week  Hyperlipidemia: (LDL goal < 70) -Controlled -Current treatment: Atorvastatin 40mg  Appropriate, Effective, Safe, Accessible -Medications previously tried: none noted   -Current exercise habits: see HTN -Educated on Cholesterol goals;  Benefits of statin for ASCVD risk reduction; Importance of limiting foods high in cholesterol; -Recommended to continue current medication Increased physical activity will also help control LDL  Atrial Fibrillation (Goal: prevent stroke and major bleeding) -Controlled -CHADSVASC: 4 -Current treatment: Rate control: Carvedilol 12.5mg  BID Appropriate, Effective, Safe, Accessible Anticoagulation: Eliquis 5mg  BID  Appropriate, Effective, Safe, Accessible -Medications previously tried: none ntoed -Home BP and HR readings: controlled  -Counseled on increased risk of stroke due to Afib and benefits of anticoagulation for stroke prevention; importance of adherence to anticoagulant exactly as prescribed; - He has  a pacemaker which seems to be controlling HR fine.  Eliquis can get expensive, provided with application for PAP today - he will  return once he meets 3% OOP spend.  Pre-Diabetes (A1c goal <6.5%) -Controlled -Current medications: None -Medications previously tried: none noted  -Current home glucose readings fasting glucose: N/A post prandial glucose: N/A -Denies hypoglycemic/hyperglycemic symptoms -Current meal patterns: he has cut back on sweets and "white stuff" -Educated on A1c and blood sugar goals; He had most recent A1c in December 2022.  Denies any polyuria polydipsia.   -Counseled to check feet daily and get yearly eye exams -Recommended he make appt for updated lab work.  Due for physical with PCP.   Willa Frater, PharmD, CPP Clinical Pharmacist Practitioner [facility] [phone]

## 2022-04-20 ENCOUNTER — Ambulatory Visit: Payer: Medicare Other | Admitting: Pharmacist

## 2022-04-20 ENCOUNTER — Other Ambulatory Visit: Payer: Self-pay | Admitting: Cardiology

## 2022-05-08 ENCOUNTER — Ambulatory Visit: Payer: Medicare Other

## 2022-05-08 ENCOUNTER — Ambulatory Visit: Payer: Medicare Other | Attending: Cardiology | Admitting: Cardiology

## 2022-05-08 ENCOUNTER — Encounter: Payer: Self-pay | Admitting: Cardiology

## 2022-05-08 VITALS — BP 124/80 | HR 60 | Ht 71.0 in | Wt 313.0 lb

## 2022-05-08 DIAGNOSIS — D6869 Other thrombophilia: Secondary | ICD-10-CM | POA: Diagnosis not present

## 2022-05-08 DIAGNOSIS — I10 Essential (primary) hypertension: Secondary | ICD-10-CM | POA: Diagnosis not present

## 2022-05-08 DIAGNOSIS — I480 Paroxysmal atrial fibrillation: Secondary | ICD-10-CM

## 2022-05-08 DIAGNOSIS — I442 Atrioventricular block, complete: Secondary | ICD-10-CM | POA: Diagnosis not present

## 2022-05-08 MED ORDER — APIXABAN 5 MG PO TABS: 5.0000 mg | ORAL_TABLET | Freq: Two times a day (BID) | ORAL | 0 refills | Status: DC

## 2022-05-08 NOTE — Addendum Note (Signed)
Addended by: Stanton Kidney on: 05/08/2022 02:33 PM   Modules accepted: Orders

## 2022-05-08 NOTE — Progress Notes (Signed)
Electrophysiology Office Note   Date:  05/08/2022   ID:  Harold Robertson, DOB 02-25-1951, MRN [id number]  PCP:  Harold Frizzle, MD  Cardiologist:  Harold Robertson Primary Electrophysiologist:  Harold Dannemiller [name], MD    No chief complaint on file.    History of Present Illness: Harold Robertson is a 72 y.o. male who presents today for electrophysiology evaluation.     Is a history of severe hypertension, obesity, obstructive sleep apnea on CPAP.  He was hospitalized with complete heart block.  He is post Environmental health practitioner CRT-P implanted 09/19/2015.  Ejection fraction was 40 to 45%.  He was diagnosed with atrial fibrillation after device implant.  He is now on Eliquis.  Today, denies symptoms of palpitations, chest Robertson, shortness of breath, orthopnea, PND, lower extremity edema, claudication, dizziness, presyncope, syncope, bleeding, or neurologic sequela. The patient is tolerating medications without difficulties.     Past Medical History:  Diagnosis Date   Arthritis    Atrial fibrillation (Harold Robertson)    Complete heart block (Harold Robertson)    Hypertension    OSA on CPAP    Presence of permanent cardiac pacemaker    Past Surgical History:  Procedure Laterality Date   APPENDECTOMY  1960s   CARDIAC CATHETERIZATION Right 09/08/2015   Procedure: Temporary Pacemaker;  Surgeon: Harold Minardi [name], MD;  Location: [facility];  Service: Cardiovascular;  Laterality: Right;   CARDIAC CATHETERIZATION N/A 09/09/2015   Procedure: Left Heart Cath and Coronary Angiography;  Surgeon: Harold M Martinique, MD;  Location: [facility];  Service: Cardiovascular;  Laterality: N/A;   CARDIOVERSION N/A 01/28/2020   Procedure: CARDIOVERSION;  Surgeon: Harold Pain, MD;  Location: [facility];  Service: Cardiovascular;  Laterality: N/A;   CHEILECTOMY Left 03/15/2018   Procedure: CHEILECTOMY LEFT FOOT;  Surgeon: Harold Butters, MD;  Location: [facility];  Service: Orthopedics;  Laterality: Left;    COLONOSCOPY     CYSTOSCOPY N/A 03/22/2021   Procedure: CYSTOSCOPY FLEXIBLE; ATTEMPTED FOLEY INSERTION;  Surgeon: Harold Butters, MD;  Location: [facility];  Service: Orthopedics;  Laterality: N/A;   EP IMPLANTABLE DEVICE N/A 09/09/2015   Procedure: BiV Pacemaker Insertion CRT-P;  Surgeon: Harold Oquinn [name], MD;  Location: [facility];  Service: Cardiovascular;  Laterality: N/A;   KNEE SURGERY Right    PPM GENERATOR CHANGEOUT N/A 02/02/2022   Procedure: PPM GENERATOR CHANGEOUT;  Surgeon: Harold Haw, MD;  Location: [facility];  Service: Cardiovascular;  Laterality: N/A;   TOTAL KNEE ARTHROPLASTY Right 07/11/2016   TOTAL KNEE ARTHROPLASTY Right 07/11/2016   Procedure: TOTAL KNEE ARTHROPLASTY;  Surgeon: Harold Butters, MD;  Location: [facility];  Service: Orthopedics;  Laterality: Right;   TOTAL KNEE ARTHROPLASTY WITH REVISION COMPONENTS Right 03/22/2021   Procedure: TOTAL KNEE ARTHROPLASTY WITH REVISION COMPONENTS (POLY EXCHANGE); I&D;  Surgeon: Harold Butters, MD;  Location: [facility];  Service: Orthopedics;  Laterality: Right;     Current Outpatient Medications  Medication Sig Dispense Refill   acetaminophen (TYLENOL) 500 MG tablet Take 2 tablets (1,000 mg total) by mouth every 6 (six) hours as needed for mild Robertson or moderate Robertson. (Patient taking differently: Take 1,000 mg by mouth every 8 (eight) hours as needed for mild Robertson or moderate Robertson.) 60 tablet 0   amoxicillin (AMOXIL) 500 MG capsule Take 1 capsule (500 mg total) by mouth 2 (two) times daily. 60 capsule 5   atorvastatin (LIPITOR) 40 MG tablet TAKE 1  TABLET BY MOUTH EVERY DAY AT 6PM 90 tablet 3   carvedilol (COREG) 12.5 MG tablet TAKE 1 TABLET BY MOUTH TWICE A DAY 180 tablet 3   cholecalciferol (VITAMIN D) 25 MCG (1000 UNIT) tablet Take 1,000 Units by mouth daily.     ELIQUIS 5 MG TABS tablet TAKE 1 TABLET BY MOUTH TWICE A DAY 180 tablet 1   lisinopril (ZESTRIL) 40 MG tablet TAKE 1 TABLET BY MOUTH EVERY DAY  90 tablet 3   No current facility-administered medications for this visit.    Allergies:   Patient has no active allergies.   Social History:  The patient  reports that he has quit smoking. His smoking use included cigars. His smokeless tobacco use includes snuff. He reports current alcohol use of about 1.0 standard drink of alcohol per week. He reports that he does not use drugs.   Family History:  The patient's family history includes Cancer (age of onset: 61) in his mother; Heart disease in his maternal grandmother; Heart disease (age of onset: 52) in his brother; Heart disease (age of onset: 10) in his father; Liver cancer in his mother.   ROS:  Please see the history of present illness.   Otherwise, review of systems is positive for none.   All other systems are reviewed and negative.   PHYSICAL EXAM: VS:  BP 124/80   Pulse 60   Ht 5' 11"$  (1.803 m)   Wt (!) 313 lb (142 kg)   SpO2 98%   BMI 43.65 kg/m  , BMI Body mass index is 43.65 kg/m. GEN: Well nourished, well developed, in no acute distress  HEENT: normal  Neck: no JVD, carotid bruits, or masses Cardiac: RRR; no murmurs, rubs, or gallops,no edema  Respiratory:  clear to auscultation bilaterally, normal work of breathing GI: soft, nontender, nondistended, + BS MS: no deformity or atrophy  Skin: warm and dry, device site well healed Neuro:  Strength and sensation are intact Psych: euthymic mood, full affect  EKG:  EKG is ordered today. Personal review of the ekg ordered shows AV paced  Personal review of the device interrogation today. Results in [place]: 01/24/2022: BUN 10; Creatinine, Ser 1.06; Hemoglobin 15.6; Platelets 173; Potassium 4.5; Sodium 141    Lipid Panel     Component Value Date/Time   CHOL 92 03/17/2021 1141   TRIG 73 03/17/2021 1141   HDL 26 (L) 03/17/2021 1141   CHOLHDL 3.5 03/17/2021 1141   VLDL 18 11/16/2016 0950   LDLCALC 51 03/17/2021 1141     Wt Readings from Last 3  Encounters:  05/08/22 (!) 313 lb (142 kg)  04/04/22 (!) 313 lb (142 kg)  04/03/22 (!) 315 lb (142.9 kg)      Other studies Reviewed: Additional studies/ records that were reviewed today include: Cardiac cath 09/09/15  Review of the above records today demonstrates:  Prox RCA lesion, 20% stenosed. Prox LAD to Mid LAD lesion, 40% stenosed. Mid Cx lesion, 20% stenosed. Ost 2nd Mrg to 2nd Mrg lesion, 20% stenosed. 1st Diag lesion, 30% stenosed. There is mild to moderate left ventricular systolic dysfunction.   1. Nonobstructive CAD 2. Mild to moderate LV dysfunction. EF 40-45%.  TTE 02/01/2018 - Left ventricle: Abnormal septal motion. The cavity size was    normal. Wall thickness was increased in a pattern of mild LVH.    Systolic function was normal. The estimated ejection fraction was    in the range of 55% to 60%. Wall  motion was normal; there were no    regional wall motion abnormalities. Doppler parameters are    consistent with abnormal left ventricular relaxation (grade 1    diastolic dysfunction).  - Left atrium: The atrium was mildly dilated.  - Atrial septum: No defect or patent foramen ovale was identified.   ASSESSMENT AND PLAN:  1.  Complete heart block: Status post [facility] CRT-P.  Post generator change 04/04/2021.  Device functioning appropriately.  No changes.  2.  Coronary artery disease: No current chest Robertson.  Continue with current management.  3.  Hypertension:well controlled  4.  Hyperlipidemia: Continue atorvastatin per primary cardiology  5.  Paroxysmal atrial fibrillation/flutter: Post cardioversion in 2021.  Currently on Eliquis.  CHA2DS2-VASc of 4.  6.  Obstructive sleep apnea: CPAP compliance encouraged  7.  Secondary hypercoagulable state: Currently on Eliquis for atrial fibrillation as above   Current medicines are reviewed at length with the patient today.   The patient does not have concerns regarding his medicines.  The following changes were  made today: none  Labs/ tests ordered today include:  Orders Placed This Encounter  Procedures   EKG 12-Lead     Disposition:   FU 12 months  Signed, Lilia Letterman [name], MD  05/08/2022 2:17 PM     [address] [place] [address] [phone] (office) [phone] (fax)

## 2022-05-08 NOTE — Patient Instructions (Signed)
Medication Instructions:  Your physician recommends that you continue on your current medications as directed. Please refer to the Current Medication list given to you today.  *If you need a refill on your cardiac medications before your next appointment, please call your pharmacy*   Lab Work: None ordered   Testing/Procedures: None ordered   Follow-Up: At [facility], you and your health needs are our priority.  As part of our continuing mission to provide you with exceptional heart care, we have created designated Provider Care Teams.  These Care Teams include your primary Cardiologist (physician) and Advanced Practice Providers (APPs -  Physician Assistants and Nurse Practitioners) who all work together to provide you with the care you need, when you need it.  Remote monitoring is used to monitor your Pacemaker or ICD from home. This monitoring reduces the number of office visits required to check your device to one time per year. It allows [place] to keep an eye on the functioning of your device to ensure it is working properly. You are scheduled for a device check from home on 08/07/22. You may send your transmission at any time that day. If you have a wireless device, the transmission will be sent automatically. After your physician reviews your transmission, you will receive a postcard with your next transmission date.  Your next appointment:   1 year(s)  The format for your next appointment:   In Person  Provider:   You may see Will [name], MD or one of the following Advanced Practice Providers on your designated Care Team:   Tommye Standard, [place] Legrand Como "Jonni Sanger" Chalmers Cater, [place]   Thank you for choosing [facility]!!   Trinidad Curet, RN [phone]    Other Instructions  [place] Patient Assistance [employer] for Eliquis:  [phone]

## 2022-05-09 DIAGNOSIS — H903 Sensorineural hearing loss, bilateral: Secondary | ICD-10-CM | POA: Diagnosis not present

## 2022-05-09 DIAGNOSIS — H838X3 Other specified diseases of inner ear, bilateral: Secondary | ICD-10-CM | POA: Diagnosis not present

## 2022-05-09 DIAGNOSIS — H9313 Tinnitus, bilateral: Secondary | ICD-10-CM | POA: Diagnosis not present

## 2022-05-09 LAB — CUP PACEART REMOTE DEVICE CHECK
Battery Remaining Longevity: 80 mo
Battery Remaining Percentage: 95.5 %
Battery Voltage: 2.98 V
Brady Statistic AP VP Percent: 98 %
Brady Statistic AP VS Percent: 1 %
Brady Statistic AS VP Percent: 2 %
Brady Statistic AS VS Percent: 1 %
Brady Statistic RA Percent Paced: 98 %
Date Time Interrogation Session: 20240211231716
Implantable Lead Connection Status: 753985
Implantable Lead Connection Status: 753985
Implantable Lead Connection Status: 753985
Implantable Lead Implant Date: 20170615
Implantable Lead Implant Date: 20170615
Implantable Lead Implant Date: 20170615
Implantable Lead Location: 753858
Implantable Lead Location: 753859
Implantable Lead Location: 753860
Implantable Pulse Generator Implant Date: 20231109
Lead Channel Impedance Value: 490 Ohm
Lead Channel Impedance Value: 490 Ohm
Lead Channel Impedance Value: 910 Ohm
Lead Channel Pacing Threshold Amplitude: 0.5 V
Lead Channel Pacing Threshold Amplitude: 1 V
Lead Channel Pacing Threshold Amplitude: 2.5 V
Lead Channel Pacing Threshold Pulse Width: 0.4 ms
Lead Channel Pacing Threshold Pulse Width: 0.4 ms
Lead Channel Pacing Threshold Pulse Width: 0.5 ms
Lead Channel Sensing Intrinsic Amplitude: 12 mV
Lead Channel Sensing Intrinsic Amplitude: 3.9 mV
Lead Channel Setting Pacing Amplitude: 1.5 V
Lead Channel Setting Pacing Amplitude: 2 V
Lead Channel Setting Pacing Amplitude: 3 V
Lead Channel Setting Pacing Pulse Width: 0.4 ms
Lead Channel Setting Pacing Pulse Width: 0.5 ms
Lead Channel Setting Sensing Sensitivity: 5 mV
Pulse Gen Model: 3562
Pulse Gen Serial Number: 8121406

## 2022-05-23 IMAGING — US US ABDOMEN LIMITED
1 series · 15 of 25 positions shown · non-contrast
Comparison: None.

CLINICAL DATA: Elevated liver function test.

EXAM:
ULTRASOUND ABDOMEN LIMITED RIGHT UPPER QUADRANT

[Series 1: us abdomen limited ruq mc & wl · 15 of 80 slices shown]
[im 1/80]
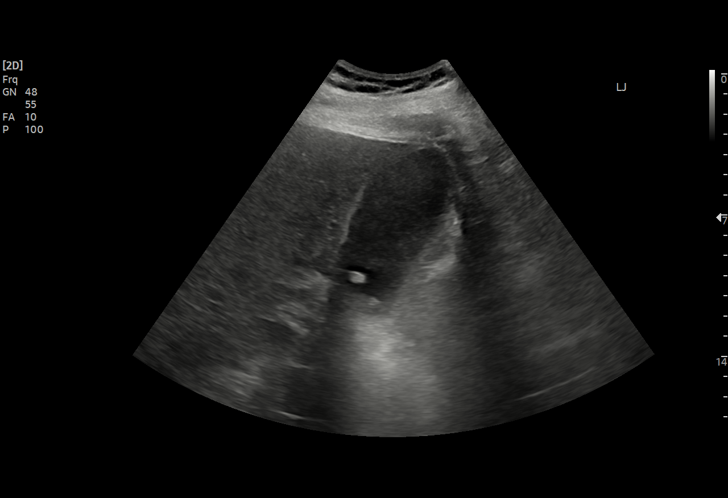
[im 7/80]
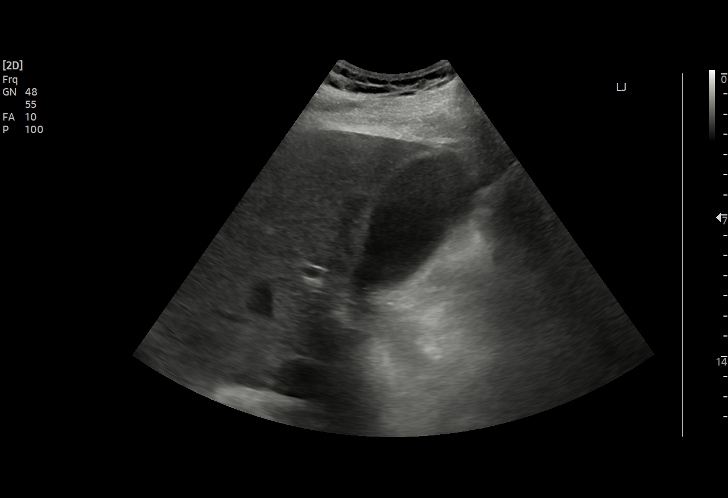
[im 14/80]
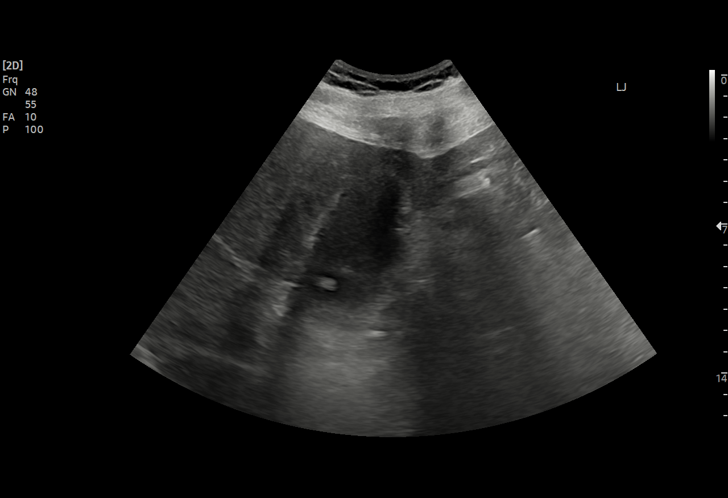
[im 17/80]
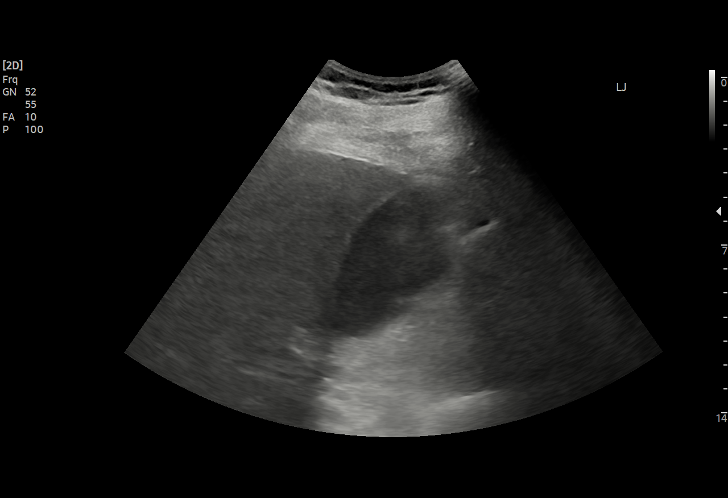
[im 24/80]
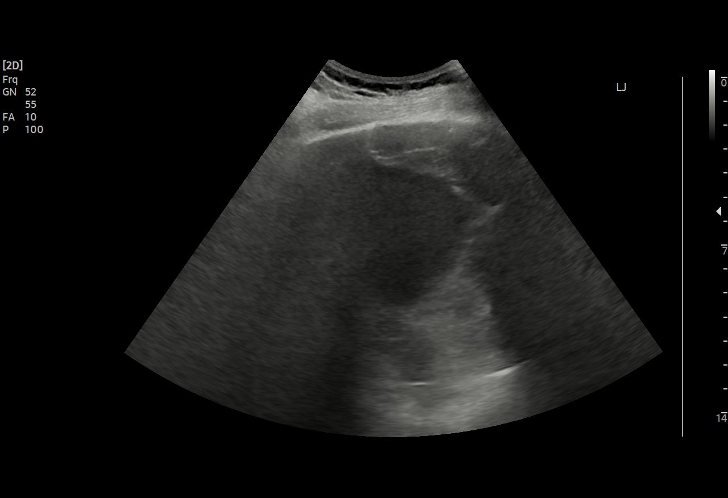
[im 30/80]
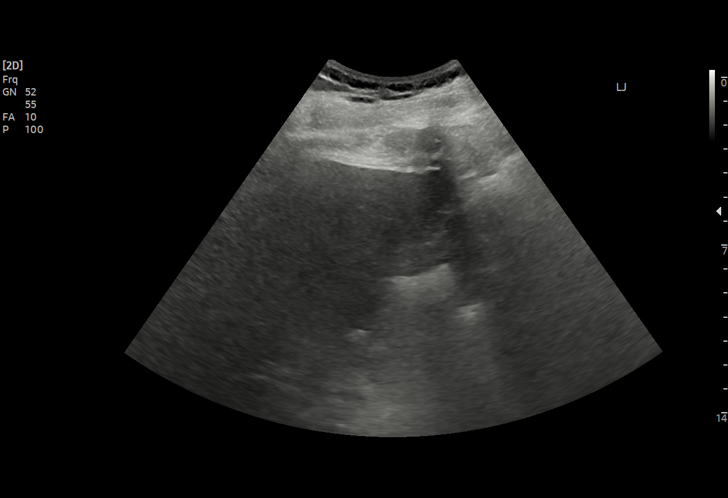
[im 33/80]
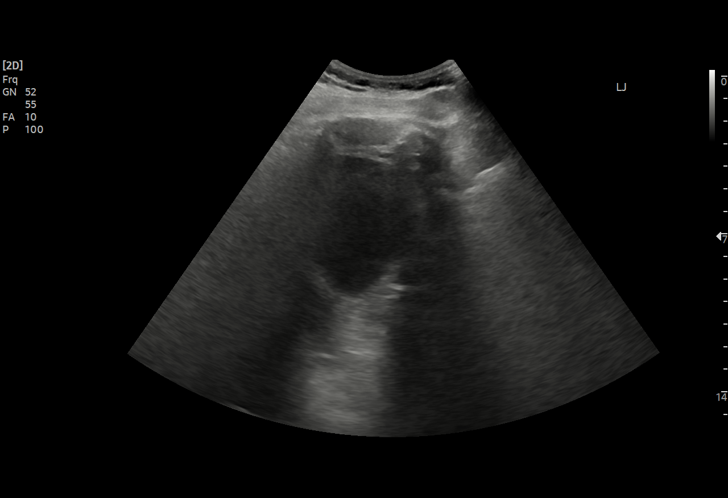
[im 40/80]
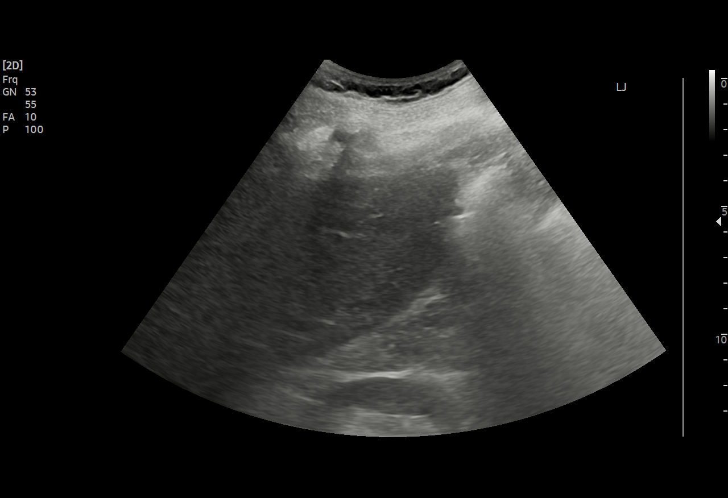
[im 47/80]
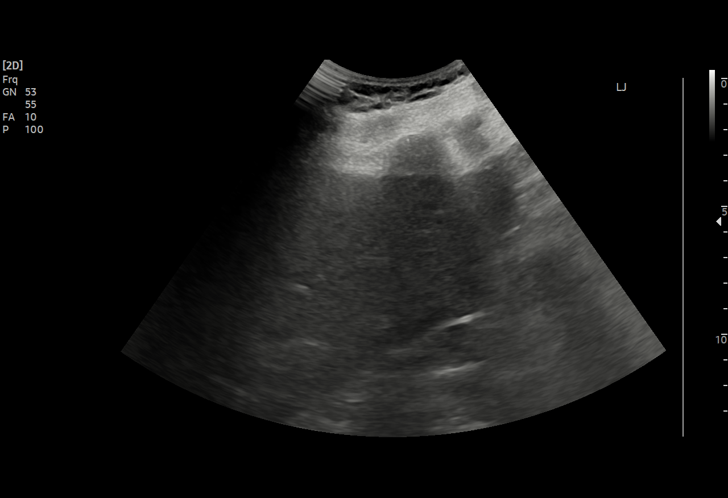
[im 50/80]
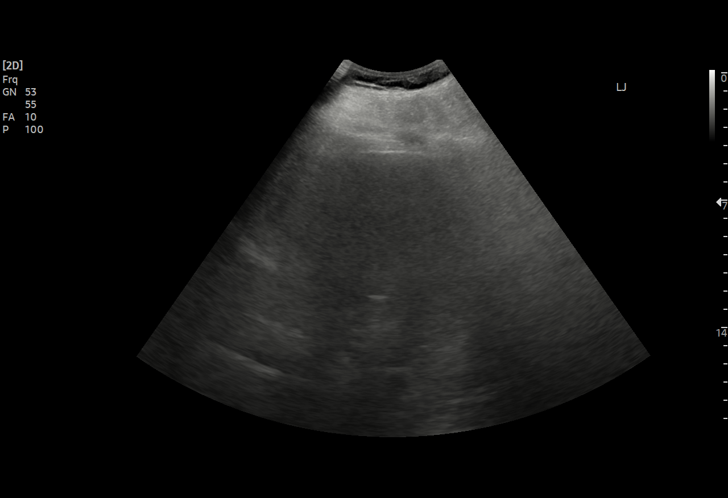
[im 56/80]
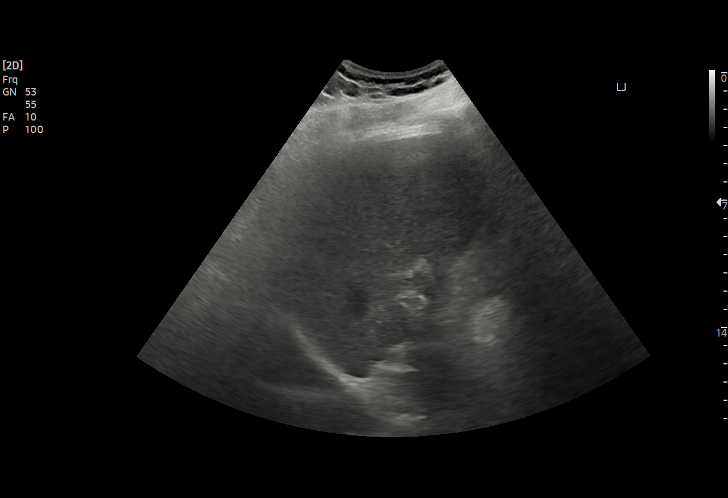
[im 63/80]
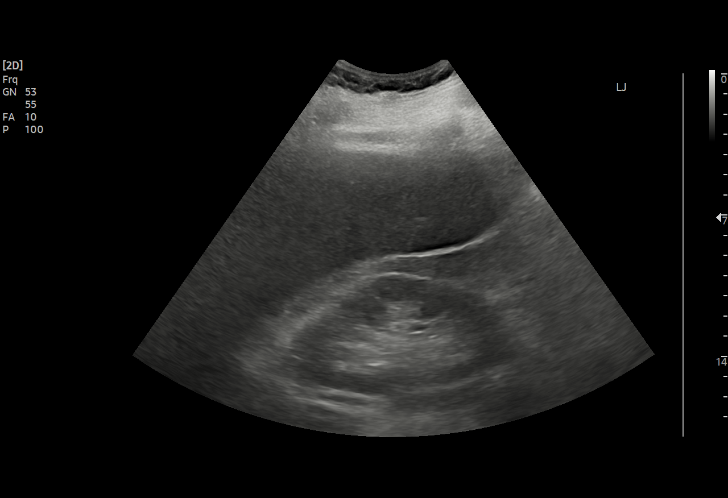
[im 66/80]
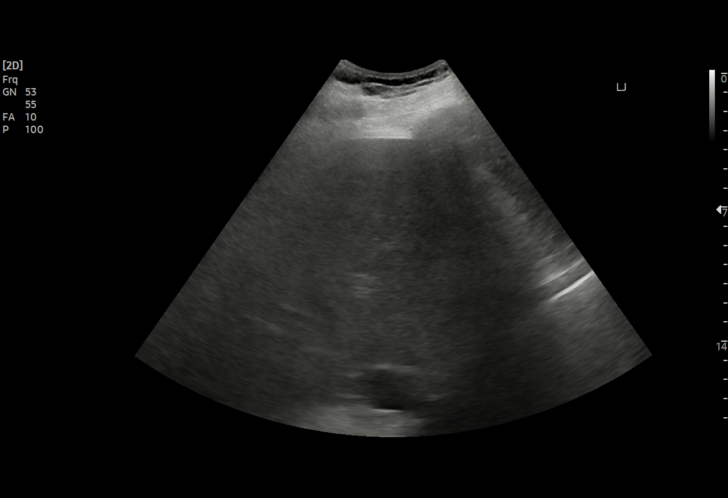
[im 73/80]
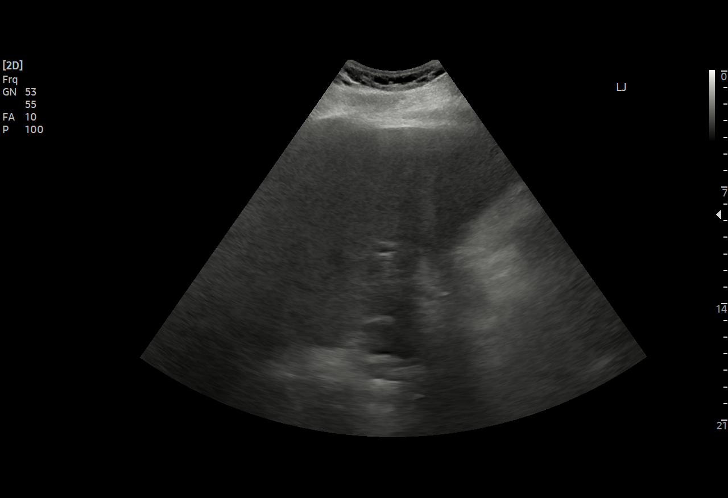
[im 80/80]
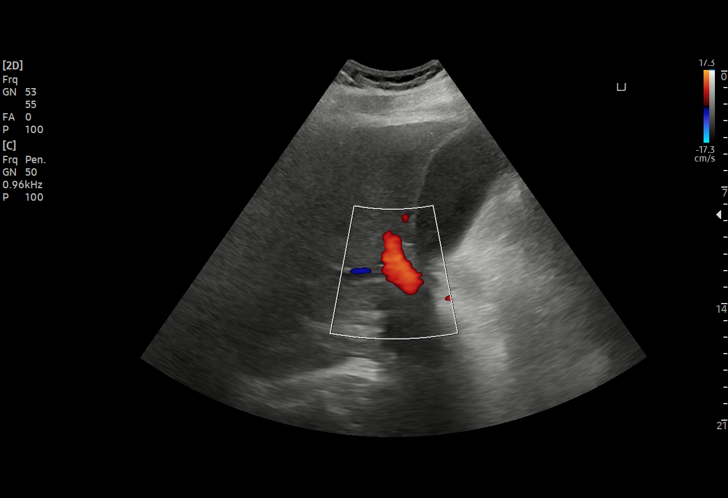

[15 of 25 positions shown; findings below may reference images not displayed]

FINDINGS: Gallbladder:

A 9.3 mm shadowing echogenic gallstone is seen within the dependent
portion of the gallbladder lumen. The gallbladder wall measures
mm in thickness. No sonographic Murphy sign noted by sonographer.

Common bile duct:

Diameter: 4.0 mm

Liver:

No focal lesion identified. Within normal limits in parenchymal
echogenicity. Portal vein is patent on color Doppler imaging with
normal direction of blood flow towards the liver.

Other: The study is markedly limited secondary to the patient's body
habitus and overlying bowel gas
IMPRESSION: 1. Markedly limited study, as described above. Further evaluation of
the liver with a liver protocol abdomen and pelvis CT is
recommended.
2. Cholelithiasis, without acute cholecystitis.

## 2022-06-07 ENCOUNTER — Other Ambulatory Visit: Payer: Self-pay

## 2022-06-07 DIAGNOSIS — I480 Paroxysmal atrial fibrillation: Secondary | ICD-10-CM

## 2022-06-07 MED ORDER — APIXABAN 5 MG PO TABS: 5.0000 mg | ORAL_TABLET | Freq: Two times a day (BID) | ORAL | 1 refills | Status: DC

## 2022-06-07 MED ORDER — ATORVASTATIN CALCIUM 40 MG PO TABS: 40.0000 mg | ORAL_TABLET | Freq: Every day | ORAL | 3 refills | Status: DC

## 2022-06-07 MED ORDER — CARVEDILOL 12.5 MG PO TABS: 12.5000 mg | ORAL_TABLET | Freq: Two times a day (BID) | ORAL | 3 refills | Status: DC

## 2022-06-07 MED ORDER — LISINOPRIL 40 MG PO TABS: 40.0000 mg | ORAL_TABLET | Freq: Every day | ORAL | 3 refills | Status: DC

## 2022-06-07 NOTE — Telephone Encounter (Signed)
Prescription refill request for Eliquis received. Indication: Afib  Last office visit: 05/08/22 (Camnitz)  Scr: 1.06 (01/24/22)  Age: 72 Weight: 142kg  Appropriate dose. Refill sent.

## 2022-06-21 NOTE — Progress Notes (Signed)
Remote pacemaker transmission.   

## 2022-07-12 ENCOUNTER — Telehealth: Payer: Self-pay | Admitting: Pharmacist

## 2022-07-12 NOTE — Progress Notes (Signed)
Care Management & Coordination Services Pharmacy Team  Reason for Encounter: General adherence update   Contacted patient for general health update and medication adherence call.  Spoke with patient on 07/12/2022    What concerns do you have about your medications? None  The patient denies side effects with their medications.   How often do you forget or accidentally miss a dose? Rarely  Do you use a pillbox? Yes  Are you having any problems getting your medications from your pharmacy? No  Has the cost of your medications been a concern? No If yes, what medication and is patient assistance available or has it been applied for?  Since last visit with PharmD, no interventions have been made.   The patient has not had an ED visit since last contact.   The patient denies problems with their health.   Patient denies concerns or questions for Erskine Emery, PharmD at this time.   Counseled patient on: Access to carecoordination team for any cost, medication or pharmacy concerns.  -Patient states he plans to schedule a physical with Dr. Tanya Nones soon.   Chart Updates:  Recent office visits:  None since last PharmD visit.  Recent consult visits:  05/08/2022 OV (Cardiology) Regan Lemming, MD; no medication changes indicated.  Hospital visits:  None since last PharmD visit  Medications: Outpatient Encounter Medications as of 07/12/2022  Medication Sig Note   acetaminophen (TYLENOL) 500 MG tablet Take 2 tablets (1,000 mg total) by mouth every 6 (six) hours as needed for mild pain or moderate pain. (Patient taking differently: Take 1,000 mg by mouth every 8 (eight) hours as needed for mild pain or moderate pain.)    amoxicillin (AMOXIL) 500 MG capsule Take 1 capsule (500 mg total) by mouth 2 (two) times daily.    apixaban (ELIQUIS) 5 MG TABS tablet Take 1 tablet (5 mg total) by mouth 2 (two) times daily.    atorvastatin (LIPITOR) 40 MG tablet Take 1 tablet (40 mg total) by  mouth daily at 6 PM.    carvedilol (COREG) 12.5 MG tablet Take 1 tablet (12.5 mg total) by mouth 2 (two) times daily.    cholecalciferol (VITAMIN D) 25 MCG (1000 UNIT) tablet Take 1,000 Units by mouth daily.    ELIQUIS 5 MG TABS tablet TAKE 1 TABLET BY MOUTH TWICE A DAY 01/31/2022: On hold for procedure   lisinopril (ZESTRIL) 40 MG tablet Take 1 tablet (40 mg total) by mouth daily.    No facility-administered encounter medications on file as of 07/12/2022.    Recent vitals BP Readings from Last 3 Encounters:  05/08/22 124/80  04/04/22 124/68  04/03/22 (!) 147/91   Pulse Readings from Last 3 Encounters:  05/08/22 60  04/03/22 65  02/02/22 69   Wt Readings from Last 3 Encounters:  05/08/22 (!) 313 lb (142 kg)  04/04/22 (!) 313 lb (142 kg)  04/03/22 (!) 315 lb (142.9 kg)   BMI Readings from Last 3 Encounters:  05/08/22 43.65 kg/m  04/04/22 43.65 kg/m  04/03/22 43.93 kg/m    Recent lab results    Component Value Date/Time   NA 141 01/24/2022 0835   K 4.5 01/24/2022 0835   CL 104 01/24/2022 0835   CO2 29 01/24/2022 0835   GLUCOSE 110 (H) 01/24/2022 0835   GLUCOSE 105 (H) 11/01/2021 0905   BUN 10 01/24/2022 0835   CREATININE 1.06 01/24/2022 0835   CREATININE 0.90 11/01/2021 0905   CALCIUM 9.4 01/24/2022 0835    Lab Results  Component Value Date   CREATININE 1.06 01/24/2022   EGFR 75 01/24/2022   GFRNONAA >60 03/23/2021   GFRAA >60 12/29/2019   Lab Results  Component Value Date/Time   HGBA1C 6.2 (H) 03/23/2021 03:21 AM   HGBA1C 5.7 (H) 08/03/2014 02:09 PM    Lab Results  Component Value Date   CHOL 92 03/17/2021   HDL 26 (L) 03/17/2021   LDLCALC 51 03/17/2021   TRIG 73 03/17/2021   CHOLHDL 3.5 03/17/2021    Care Gaps: Annual wellness visit in last year? Yes  Star Rating Drugs:  Atorvastatin 40 mg last filled 06/08/2022 90 DS Lisinopril 40 mg last filled 06/27/2022 90 DS   Future Appointments  Date Time Provider Department Center  08/07/2022  7:10 AM  [facility] DEVICE REMOTES [facility]  10/05/2022  3:00 PM Erroll Luna, RPH [facility] None  10/09/2022  8:45 AM Kathlynn Grate, DO [facility]  11/06/2022  7:10 AM [facility] DEVICE REMOTES [facility]  02/05/2023  7:10 AM [facility] DEVICE REMOTES [facility]  05/07/2023  7:10 AM [facility] DEVICE REMOTES [facility]  08/06/2023  7:10 AM [facility] DEVICE REMOTES [facility]  11/05/2023  7:10 AM [facility] DEVICE REMOTES [facility]  02/04/2024  7:10 AM [facility] DEVICE REMOTES [facility]   April D Calhoun, [facility] Clinical Pharmacist Assistant [phone]

## 2022-08-07 ENCOUNTER — Ambulatory Visit (INDEPENDENT_AMBULATORY_CARE_PROVIDER_SITE_OTHER): Payer: Medicare Other

## 2022-08-07 DIAGNOSIS — I442 Atrioventricular block, complete: Secondary | ICD-10-CM

## 2022-08-08 LAB — CUP PACEART REMOTE DEVICE CHECK
Battery Remaining Longevity: 70 mo
Battery Remaining Percentage: 95 %
Battery Voltage: 2.98 V
Brady Statistic AP VP Percent: 93 %
Brady Statistic AP VS Percent: 1 %
Brady Statistic AS VP Percent: 6.6 %
Brady Statistic AS VS Percent: 1 %
Brady Statistic RA Percent Paced: 93 %
Date Time Interrogation Session: 20240513020011
Implantable Lead Connection Status: 753985
Implantable Lead Connection Status: 753985
Implantable Lead Connection Status: 753985
Implantable Lead Implant Date: 20170615
Implantable Lead Implant Date: 20170615
Implantable Lead Implant Date: 20170615
Implantable Lead Location: 753858
Implantable Lead Location: 753859
Implantable Lead Location: 753860
Implantable Pulse Generator Implant Date: 20231109
Lead Channel Impedance Value: 450 Ohm
Lead Channel Impedance Value: 460 Ohm
Lead Channel Impedance Value: 790 Ohm
Lead Channel Pacing Threshold Amplitude: 0.5 V
Lead Channel Pacing Threshold Amplitude: 1 V
Lead Channel Pacing Threshold Amplitude: 2.75 V
Lead Channel Pacing Threshold Pulse Width: 0.4 ms
Lead Channel Pacing Threshold Pulse Width: 0.4 ms
Lead Channel Pacing Threshold Pulse Width: 0.5 ms
Lead Channel Sensing Intrinsic Amplitude: 12 mV
Lead Channel Sensing Intrinsic Amplitude: 5 mV
Lead Channel Setting Pacing Amplitude: 1.5 V
Lead Channel Setting Pacing Amplitude: 2 V
Lead Channel Setting Pacing Amplitude: 3.25 V
Lead Channel Setting Pacing Pulse Width: 0.4 ms
Lead Channel Setting Pacing Pulse Width: 0.5 ms
Lead Channel Setting Sensing Sensitivity: 5 mV
Pulse Gen Model: 3562
Pulse Gen Serial Number: 8121406

## 2022-08-24 ENCOUNTER — Telehealth: Payer: Self-pay

## 2022-08-24 NOTE — Telephone Encounter (Signed)
-----   Message from Lucrezia Europe sent at 08/24/2022  3:23 PM EDT ----- Regarding: Cancellation Due to Refills Afternoon!   I wanted to see if someone could possibly give this patient some advisement. I have not cancelled just yet, but I call patient to reschedule appt that is scheduled for Dr. Earlene Plater due to his departure and the patient stated that he wants to cancel since we wont refill his amoxicillin. I had seen that he does have refills, but he said we "denied" it and he wants the appointment cancelled.

## 2022-08-24 NOTE — Telephone Encounter (Signed)
Called patient regarding message. States that he stopped taking Amoxicillin about a month ago. Denies any pain, fever, redness. Does not want to reschedule appointment at this time.  Will call office back if he has any concerns.

## 2022-09-01 NOTE — Progress Notes (Signed)
Remote pacemaker transmission.   

## 2022-09-20 ENCOUNTER — Encounter: Payer: Self-pay | Admitting: Pharmacist

## 2022-09-20 NOTE — Progress Notes (Signed)
Patient previously followed by [facility] pharmacist. Per clinical review, no pharmacist appointment needed at this time. Will make pharmacy patient advocate team aware of medication assistance for Eliquis.Care guide directed to contact patient and cancel appointment and notify pharmacy team of any patient concerns.

## 2022-10-05 ENCOUNTER — Encounter: Payer: Medicare Other | Admitting: Pharmacist

## 2022-10-09 ENCOUNTER — Ambulatory Visit: Payer: Medicare Other | Admitting: Internal Medicine

## 2022-10-20 ENCOUNTER — Ambulatory Visit (INDEPENDENT_AMBULATORY_CARE_PROVIDER_SITE_OTHER): Payer: Medicare Other | Admitting: Family Medicine

## 2022-10-20 ENCOUNTER — Encounter: Payer: Self-pay | Admitting: Family Medicine

## 2022-10-20 VITALS — BP 126/76 | HR 64 | Temp 97.4°F | Ht 71.0 in | Wt 300.8 lb

## 2022-10-20 DIAGNOSIS — Z1211 Encounter for screening for malignant neoplasm of colon: Secondary | ICD-10-CM

## 2022-10-20 DIAGNOSIS — I5020 Unspecified systolic (congestive) heart failure: Secondary | ICD-10-CM

## 2022-10-20 DIAGNOSIS — Z0001 Encounter for general adult medical examination with abnormal findings: Secondary | ICD-10-CM | POA: Diagnosis not present

## 2022-10-20 DIAGNOSIS — N401 Enlarged prostate with lower urinary tract symptoms: Secondary | ICD-10-CM

## 2022-10-20 DIAGNOSIS — E7800 Pure hypercholesterolemia, unspecified: Secondary | ICD-10-CM

## 2022-10-20 DIAGNOSIS — I480 Paroxysmal atrial fibrillation: Secondary | ICD-10-CM

## 2022-10-20 DIAGNOSIS — Z125 Encounter for screening for malignant neoplasm of prostate: Secondary | ICD-10-CM | POA: Diagnosis not present

## 2022-10-20 DIAGNOSIS — R350 Frequency of micturition: Secondary | ICD-10-CM | POA: Diagnosis not present

## 2022-10-20 LAB — CBC WITH DIFFERENTIAL/PLATELET
Absolute Monocytes: 462 cells/uL (ref 200–950)
Basophils Absolute: 33 cells/uL (ref 0–200)
Basophils Relative: 0.5 %
Eosinophils Absolute: 198 cells/uL (ref 15–500)
Eosinophils Relative: 3 %
HCT: 45.3 % (ref 38.5–50.0)
Hemoglobin: 15.4 g/dL (ref 13.2–17.1)
Lymphs Abs: 1782 cells/uL (ref 850–3900)
MCH: 30.2 pg (ref 27.0–33.0)
MCHC: 34 g/dL (ref 32.0–36.0)
MCV: 88.8 fL (ref 80.0–100.0)
MPV: 10.8 fL (ref 7.5–12.5)
Monocytes Relative: 7 %
Neutro Abs: 4125 cells/uL (ref 1500–7800)
Neutrophils Relative %: 62.5 %
Platelets: 191 10*3/uL (ref 140–400)
RBC: 5.1 10*6/uL (ref 4.20–5.80)
RDW: 12.7 % (ref 11.0–15.0)
Total Lymphocyte: 27 %
WBC: 6.6 10*3/uL (ref 3.8–10.8)

## 2022-10-20 MED ORDER — LISINOPRIL 20 MG PO TABS: 20.0000 mg | ORAL_TABLET | Freq: Every day | ORAL | 3 refills | Status: DC

## 2022-10-20 NOTE — Progress Notes (Signed)
Subjective:    Patient ID: Harold Robertson, male    DOB: 11/04/1950, 72 y.o.   MRN: [id number]  HPI  Patient is here today for complete physical exam.  Past medical history is significant for atrioventricular block, complete heart block, requiring pacemaker placement.  Apparently he was hospitalized with a heart rate in the 20s per his report requiring pacemaker placement.  At that time in 2017, his ejection fraction was 40 to 45% on echocardiogram.  He is done well since his pacemaker has been placed and follows up regularly with his EP specialist Dr. Elberta Fortis.    Had colonoscopy in 2016 with [facility].  States found tubular adenoma.  Recommended repeat colonoscopy in 5 years.  He is not due for this.  Also due prophy and screening.  He is an echocardiogram last year revealed ejection fraction of 60%.  Currently on carvedilol for rate control and lisinopril for reduced ejection fraction and hypertension.  Denies any chest pain or shortness of breath however he does report occasional episodes of hypotension with systolic blood pressures in the 90s particularly after exercise.   Immunization History  Administered Date(s) Administered   Fluad Quad(high Dose 65+) 12/04/2018, 04/04/2022   Influenza Split 01/31/2013, 03/17/2015   Influenza, High Dose Seasonal PF 12/18/2017   Influenza,inj,Quad PF,6+ Mos 01/14/2016, 11/16/2016   Influenza-Unspecified 01/02/2014   PFIZER(Purple Top)SARS-COV-2 Vaccination 04/15/2019, 05/06/2019   Pneumococcal Conjugate-13 11/04/2018   Pneumococcal Polysaccharide-23 01/28/2018   Tdap 01/14/2016, 05/11/2016    Past Medical History:  Diagnosis Date   Arthritis    Atrial fibrillation (HCC)    Complete heart block (HCC)    Hypertension    OSA on CPAP    Presence of permanent cardiac pacemaker    Past Surgical History:  Procedure Laterality Date   APPENDECTOMY  1960s   CARDIAC CATHETERIZATION Right 09/08/2015   Procedure: Temporary Pacemaker;  Surgeon: Will  Jorja Loa, MD;  Location: [facility] IN[facility]ASIVE [facility] LAB;  Service: Cardiovascular;  Laterality: Right;   CARDIAC CATHETERIZATION N/A 09/09/2015   Procedure: Left Heart Cath and Coronary Angiography;  Surgeon: Peter M Swaziland, MD;  Location: [facility] INVASIVE CV LAB;  Service: Cardiovascular;  Laterality: N/A;   CARDIOVERSION N/A 01/28/2020   Procedure: CARDIOVERSION;  Surgeon: Jake Bathe, MD;  Location[facility] ENDOSCOPY;  Service: Cardiovascular;  Laterality: N/A;   CHEILECTOMY Left 03/15/2018   Procedure: CHEILECTOMY LEFT FOOT;  Surgeon: Sheral Apley, MD;  Location: [facility];  Service: Orthopedics;  Laterality: Left;   COLONOSCOPY     CYSTOSCOPY N/A 03/22/2021   Procedure: CYSTOSCOPY FLEXIBLE; ATTEMPTED FOLEY INSERTION;  Surgeon: Sheral Apley, MD;  Location: [facility];  Service: Orthopedics;  Laterality: N/A;   EP IMPLANTABLE DEVICE N/A 09/09/2015   Procedure: BiV Pacemaker Insertion CRT-P;  Surgeon: Will Jorja Loa, MD;  Location: [facility] INVASIVE CV LAB;  Service: Cardiovascular;  Laterality: N/A;   KNEE SURGERY Right    PPM GENERATOR CHANGEOUT N/A 02/02/2022   Procedure: PPM GENERATOR CHANGEOUT;  Surgeon: Regan Lemming, MD;  Location: [facility];  Service: Cardiovascular;  Laterality: N/A;   TOTAL KNEE ARTHROPLASTY Right 07/11/2016   TOTAL KNEE ARTHROPLASTY Right 07/11/2016   Procedure: TOTAL KNEE ARTHROPLASTY;  Surgeon: Sheral Apley, MD;  Location: [facility];  Service: Orthopedics;  Laterality: Right;   TOTAL KNEE ARTHROPLASTY WITH REVISION COMPONENTS Right 03/22/2021   Procedure: TOTAL KNEE ARTHROPLASTY WITH REVISION COMPONENTS (POLY EXCHANGE); I&D;  Surgeon: Sheral Apley, MD;  Location: [facility];  Service: Orthopedics;  Laterality: Right;   Current Outpatient Medications on File Prior to Visit  Medication Sig Dispense Refill   acetaminophen (TYLENOL) 500 MG tablet Take 2 tablets (1,000 mg total) by mouth every 6 (six) hours as needed for mild pain or moderate  pain. (Patient taking differently: Take 1,000 mg by mouth every 8 (eight) hours as needed for mild pain or moderate pain.) 60 tablet 0   atorvastatin (LIPITOR) 40 MG tablet Take 1 tablet (40 mg total) by mouth daily at 6 PM. 90 tablet 3   carvedilol (COREG) 12.5 MG tablet Take 1 tablet (12.5 mg total) by mouth 2 (two) times daily. 180 tablet 3   cholecalciferol (VITAMIN D) 25 MCG (1000 UNIT) tablet Take 1,000 Units by mouth daily.     ELIQUIS 5 MG TABS tablet TAKE 1 TABLET BY MOUTH TWICE A DAY 180 tablet 1   No current facility-administered medications on file prior to visit.   No Active Allergies Social History   Socioeconomic History   Marital status: Married    Spouse name: Not on file   Number of children: Not on file   Years of education: Not on file   Highest education level: Not on file  Occupational History   Occupation: truck driver  Tobacco Use   Smoking status: Former    Types: Cigars   Smokeless tobacco: Current    Types: Snuff   Tobacco comments:    occ  Vaping Use   Vaping status: Never Used  Substance and Sexual Activity   Alcohol use: Yes    Alcohol/week: 1.0 standard drink of alcohol    Types: 1 Cans of beer per week    Comment: occ   Drug use: No   Sexual activity: Yes  Other Topics Concern   Not on file  Social History Narrative   Marital status: Married x 30 years; first marriage     Children: 2 children; multiple grandchildren      Lives: lives with wife      Employment: truck driver x 14 years; [place]        Tobacco:  None; pipes and cigars; chews tobacco.       Alcohol:  Weekends.      Drugs:  None      Exercise:  Rarely.      Seatbelt:  100%      Guns:  Loaded and secured.   Social Determinants of Health   Financial Resource Strain: Low Risk  (04/20/2022)   Overall Financial Resource Strain (CARDIA)    Difficulty of Paying Living Expenses: Not hard at all  Food Insecurity: No Food Insecurity (04/20/2022)   Hunger Vital Sign     Worried About Running Out of Food in the Last Year: Never true    Ran Out of Food in the Last Year: Never true  Transportation Needs: No Transportation Needs (04/04/2022)   PRAPARE - Administrator, Civil Service (Medical): No    Lack of Transportation (Non-Medical): No  Physical Activity: Insufficiently Active (04/04/2022)   Exercise Vital Sign    Days of Exercise per Week: 2 days    Minutes of Exercise per Session: 30 min  Stress: No Stress Concern Present (04/04/2022)   [employer] of Occupational Health - Occupational Stress Questionnaire    Feeling of Stress : Not at all  Social Connections: Socially Integrated (04/04/2022)   Social Connection and Isolation Panel [NHANES]    Frequency of Communication with Friends and Family:  More than three times a week    Frequency of Social Gatherings with Friends and Family: Three times a week    Attends Religious Services: More than 4 times per year    Active Member of Clubs or Organizations: Yes    Attends Banker Meetings: More than 4 times per year    Marital Status: Married  Catering manager Violence: Not At Risk (04/04/2022)   Humiliation, Afraid, Rape, and Kick questionnaire    Fear of Current or Ex-Partner: No    Emotionally Abused: No    Physically Abused: No    Sexually Abused: No   Family History  Problem Relation Age of Onset   Heart disease Father 19       AMI   Liver cancer Mother    Cancer Mother 12       Colon cancer with liver mets   Heart disease Brother 86       AMI   Heart disease Maternal Grandmother       Review of Systems  All other systems reviewed and are negative.      Objective:   Physical Exam Vitals reviewed.  Constitutional:      General: He is not in acute distress.    Appearance: Normal appearance. He is well-developed. He is obese. He is not ill-appearing, toxic-appearing or diaphoretic.  HENT:     Head: Normocephalic and atraumatic.     Right Ear: Tympanic membrane,  ear canal and external ear normal.     Left Ear: Tympanic membrane, ear canal and external ear normal.     Nose: Nose normal. No congestion or rhinorrhea.     Mouth/Throat:     Pharynx: Oropharynx is clear. No oropharyngeal exudate or posterior oropharyngeal erythema.  Eyes:     General: No scleral icterus.       Right eye: No discharge.        Left eye: No discharge.     Extraocular Movements: Extraocular movements intact.     Conjunctiva/sclera: Conjunctivae normal.     Pupils: Pupils are equal, round, and reactive to light.  Neck:     Thyroid: No thyromegaly.     Vascular: No carotid bruit.  Cardiovascular:     Rate and Rhythm: Normal rate and regular rhythm.     Pulses: Normal pulses.     Heart sounds: Normal heart sounds. No murmur heard.    No friction rub. No gallop.  Pulmonary:     Effort: Pulmonary effort is normal. No respiratory distress.     Breath sounds: Normal breath sounds. No stridor. No wheezing, rhonchi or rales.  Chest:     Chest wall: No tenderness.  Abdominal:     General: Bowel sounds are normal. There is no distension.     Palpations: Abdomen is soft. There is no mass.     Tenderness: There is no abdominal tenderness. There is no guarding or rebound.     Hernia: No hernia is present.  Musculoskeletal:        General: No deformity.     Cervical back: Normal range of motion and neck supple. No rigidity. No muscular tenderness.     Right lower leg: No edema.     Left lower leg: No edema.  Lymphadenopathy:     Cervical: No cervical adenopathy.  Skin:    Coloration: Skin is not jaundiced.     Findings: No bruising, erythema, lesion or rash.  Neurological:     General: No focal  deficit present.     Mental Status: He is alert and oriented to person, place, and time.     Cranial Nerves: No cranial nerve deficit.     Sensory: No sensory deficit.     Motor: No weakness.     Coordination: Coordination normal.     Gait: Gait normal.     Deep Tendon  Reflexes: Reflexes normal.  Psychiatric:        Mood and Affect: Mood normal.        Behavior: Behavior normal.        Thought Content: Thought content normal.        Judgment: Judgment normal.           Assessment & Plan:  Colon cancer screening - Plan: Ambulatory referral to Gastroenterology  Prostate cancer screening - Plan: PSA  Systolic congestive heart failure, NYHA class 1, unspecified congestive heart failure chronicity (HCC)  PAF (paroxysmal atrial fibrillation) (HCC)  Pure hypercholesterolemia - Plan: CBC with Differential/Platelet, Lipid panel, COMPLETE METABOLIC PANEL WITH GFR  Benign prostatic hyperplasia with urinary frequency - Plan: PSA Recommended reducing lisinopril to 20 mg a day to avoid hypotension.  Continue carvedilol for rate control.  Appropriately anticoagulated with Eliquis.  Scheduled for colonoscopy.  Screen for prostate cancer with PSA.  Check CBC, CMP, and a lipid panel.  Goal LDL cholesterol less than 161.  Recommend the shingles vaccine when available.  Patient denies any falls, depression, or memory loss

## 2022-11-06 ENCOUNTER — Ambulatory Visit: Payer: Medicare Other

## 2022-11-06 DIAGNOSIS — I442 Atrioventricular block, complete: Secondary | ICD-10-CM | POA: Diagnosis not present

## 2022-11-16 ENCOUNTER — Telehealth: Payer: Self-pay | Admitting: Family Medicine

## 2022-11-16 NOTE — Telephone Encounter (Signed)
Received call from Misty Stanley with [facility] to follow up on referral received back in July; requesting office visit notes (within 12 mos), labs, and x-rays.   Please fax to [phone].  Please advise with questions at [phone].

## 2022-11-20 NOTE — Progress Notes (Signed)
Remote pacemaker transmission.   

## 2023-02-05 ENCOUNTER — Ambulatory Visit: Payer: Medicare Other

## 2023-02-05 DIAGNOSIS — I442 Atrioventricular block, complete: Secondary | ICD-10-CM

## 2023-02-05 DIAGNOSIS — I428 Other cardiomyopathies: Secondary | ICD-10-CM

## 2023-02-07 LAB — CUP PACEART REMOTE DEVICE CHECK
Battery Remaining Longevity: 65 mo
Battery Remaining Percentage: 87 %
Battery Voltage: 2.96 V
Brady Statistic AP VP Percent: 95 %
Brady Statistic AP VS Percent: 1 %
Brady Statistic AS VP Percent: 5.4 %
Brady Statistic AS VS Percent: 1 %
Brady Statistic RA Percent Paced: 94 %
Date Time Interrogation Session: 20241111025627
Implantable Lead Connection Status: 753985
Implantable Lead Connection Status: 753985
Implantable Lead Connection Status: 753985
Implantable Lead Implant Date: 20170615
Implantable Lead Implant Date: 20170615
Implantable Lead Implant Date: 20170615
Implantable Lead Location: 753858
Implantable Lead Location: 753859
Implantable Lead Location: 753860
Implantable Pulse Generator Implant Date: 20231109
Lead Channel Impedance Value: 490 Ohm
Lead Channel Impedance Value: 510 Ohm
Lead Channel Impedance Value: 800 Ohm
Lead Channel Pacing Threshold Amplitude: 0.875 V
Lead Channel Pacing Threshold Amplitude: 1.25 V
Lead Channel Pacing Threshold Amplitude: 2.25 V
Lead Channel Pacing Threshold Pulse Width: 0.4 ms
Lead Channel Pacing Threshold Pulse Width: 0.4 ms
Lead Channel Pacing Threshold Pulse Width: 0.5 ms
Lead Channel Sensing Intrinsic Amplitude: 4.6 mV
Lead Channel Sensing Intrinsic Amplitude: 9 mV
Lead Channel Setting Pacing Amplitude: 2 V
Lead Channel Setting Pacing Amplitude: 2.25 V
Lead Channel Setting Pacing Amplitude: 2.75 V
Lead Channel Setting Pacing Pulse Width: 0.4 ms
Lead Channel Setting Pacing Pulse Width: 0.5 ms
Lead Channel Setting Sensing Sensitivity: 5 mV
Pulse Gen Model: 3562
Pulse Gen Serial Number: 8121406

## 2023-03-01 ENCOUNTER — Other Ambulatory Visit: Payer: Self-pay | Admitting: Cardiology

## 2023-03-01 NOTE — Progress Notes (Signed)
Remote pacemaker transmission.   

## 2023-03-02 NOTE — Telephone Encounter (Signed)
Prescription refill request for Eliquis received. Indication:afib Last office visit:2/24 Scr:1.12  7/24 Age: 72 Weight:136.4  kg  Prescription refilled

## 2023-03-05 ENCOUNTER — Telehealth: Payer: Self-pay

## 2023-03-05 ENCOUNTER — Other Ambulatory Visit: Payer: Self-pay

## 2023-03-05 DIAGNOSIS — I10 Essential (primary) hypertension: Secondary | ICD-10-CM

## 2023-03-05 MED ORDER — LISINOPRIL 20 MG PO TABS: 20.0000 mg | ORAL_TABLET | Freq: Every day | ORAL | 3 refills | Status: DC

## 2023-03-05 NOTE — Telephone Encounter (Signed)
Prescription Request  03/05/2023  LOV: 10/20/22  What is the name of the medication or equipment? lisinopril (ZESTRIL) 20 MG tablet [[id number]]   Have you contacted your pharmacy to request a refill? Yes   Which pharmacy would you like this sent to?  [facility] - [place], [place] - [address] [place] [address] Phone: [phone]  Fax: [phone]    Patient notified that their request is being sent to the clinical staff for review and that they should receive a response within 2 business days.   Please advise at [facility] [phone]

## 2023-03-09 ENCOUNTER — Telehealth: Payer: Self-pay

## 2023-03-09 NOTE — Telephone Encounter (Signed)
Copied from CRM [id number]. Topic: Clinical - Medication Refill >> Mar 09, 2023 10:37 AM Clayton Bibles wrote: Most Recent Primary Care Visit:  Provider: Lynnea Ferrier T  Department: [facility]  Visit Type: PHYSICAL  Date: 10/20/2022  Medication: lisinopril (ZESTRIL) 20 MG tablet  Has the patient contacted their pharmacy? No - [facility] is calling to request refill (Agent: If no, request that the patient contact the pharmacy for the refill. If patient does not wish to contact the pharmacy document the reason why and proceed with request.) (Agent: If yes, when and what did the pharmacy advise?)  Is this the correct pharmacy for this prescription? Yes - [facility] If no, delete pharmacy and type the correct one.  This is the patient's preferred pharmacy:  [id number] [place], [place] - [address] [place] [address] Phone: [phone] Fax: [phone]  [facility] - [place], [place] - [address] [place] [address] Phone: [phone] Fax: [phone]   Has the prescription been filled recently? No  Is the patient out of the medication? No  Has the patient been seen for an appointment in the last year OR does the patient have an upcoming appointment? Yes  Can we respond through MyChart? No  Agent: Please be advised that Rx refills may take up to 3 business days. We ask that you follow-up with your pharmacy.

## 2023-05-07 ENCOUNTER — Ambulatory Visit (INDEPENDENT_AMBULATORY_CARE_PROVIDER_SITE_OTHER): Payer: Medicare Other

## 2023-05-07 DIAGNOSIS — I428 Other cardiomyopathies: Secondary | ICD-10-CM

## 2023-05-07 DIAGNOSIS — I442 Atrioventricular block, complete: Secondary | ICD-10-CM

## 2023-05-08 LAB — CUP PACEART REMOTE DEVICE CHECK
Battery Remaining Longevity: 62 mo
Battery Remaining Percentage: 83 %
Battery Voltage: 2.96 V
Brady Statistic AP VP Percent: 94 %
Brady Statistic AP VS Percent: 1 %
Brady Statistic AS VP Percent: 5.5 %
Brady Statistic AS VS Percent: 1 %
Brady Statistic RA Percent Paced: 94 %
Date Time Interrogation Session: 20250210033828
Implantable Lead Connection Status: 753985
Implantable Lead Connection Status: 753985
Implantable Lead Connection Status: 753985
Implantable Lead Implant Date: 20170615
Implantable Lead Implant Date: 20170615
Implantable Lead Implant Date: 20170615
Implantable Lead Location: 753858
Implantable Lead Location: 753859
Implantable Lead Location: 753860
Implantable Pulse Generator Implant Date: 20231109
Lead Channel Impedance Value: 490 Ohm
Lead Channel Impedance Value: 510 Ohm
Lead Channel Impedance Value: 840 Ohm
Lead Channel Pacing Threshold Amplitude: 0.5 V
Lead Channel Pacing Threshold Amplitude: 0.875 V
Lead Channel Pacing Threshold Amplitude: 2.875 V
Lead Channel Pacing Threshold Pulse Width: 0.4 ms
Lead Channel Pacing Threshold Pulse Width: 0.4 ms
Lead Channel Pacing Threshold Pulse Width: 0.5 ms
Lead Channel Sensing Intrinsic Amplitude: 5 mV
Lead Channel Sensing Intrinsic Amplitude: 9.1 mV
Lead Channel Setting Pacing Amplitude: 1.5 V
Lead Channel Setting Pacing Amplitude: 2 V
Lead Channel Setting Pacing Amplitude: 3.375
Lead Channel Setting Pacing Pulse Width: 0.4 ms
Lead Channel Setting Pacing Pulse Width: 0.5 ms
Lead Channel Setting Sensing Sensitivity: 5 mV
Pulse Gen Model: 3562
Pulse Gen Serial Number: 8121406

## 2023-05-10 ENCOUNTER — Encounter: Payer: Self-pay | Admitting: Cardiology

## 2023-05-10 ENCOUNTER — Ambulatory Visit: Payer: Medicare Other | Attending: Cardiology | Admitting: Cardiology

## 2023-05-10 VITALS — BP 140/86 | HR 62 | Ht 71.0 in | Wt 308.0 lb

## 2023-05-10 DIAGNOSIS — I2510 Atherosclerotic heart disease of native coronary artery without angina pectoris: Secondary | ICD-10-CM

## 2023-05-10 DIAGNOSIS — D6869 Other thrombophilia: Secondary | ICD-10-CM

## 2023-05-10 DIAGNOSIS — I5022 Chronic systolic (congestive) heart failure: Secondary | ICD-10-CM

## 2023-05-10 DIAGNOSIS — I480 Paroxysmal atrial fibrillation: Secondary | ICD-10-CM

## 2023-05-10 DIAGNOSIS — I10 Essential (primary) hypertension: Secondary | ICD-10-CM

## 2023-05-10 DIAGNOSIS — I442 Atrioventricular block, complete: Secondary | ICD-10-CM

## 2023-05-10 DIAGNOSIS — G4733 Obstructive sleep apnea (adult) (pediatric): Secondary | ICD-10-CM

## 2023-05-10 NOTE — Progress Notes (Signed)
  Electrophysiology Office Note:   Date:  05/10/2023  ID:  Harold Robertson, DOB 1951-02-16, MRN [id number]  Primary Cardiologist: Chilton Si, MD Primary Heart Failure: None Electrophysiologist: Jamilia Jacques Jorja Loa, MD      History of Present Illness:   Harold Robertson is a 73 y.o. male with h/o hypertension, obesity, sleep apnea, complete heart block, atrial fibrillation seen today for routine electrophysiology followup.   Since last being seen in our clinic the patient reports doing overall well.  He has baseline shortness of breath and lack of energy that is unchanged.  He states that he is not very active, he has difficulty with motivation.  Aside from that, he has no acute complaints.  he denies chest pain, palpitations, dyspnea, PND, orthopnea, nausea, vomiting, dizziness, syncope, edema, weight gain, or early satiety.   Review of systems complete and found to be negative unless listed in HPI.      EP Information / Studies Reviewed:    EKG is ordered today. Personal review as below.  EKG Interpretation Date/Time:  Thursday May 10 2023 14:18:14 EST Ventricular Rate:  61 PR Interval:    QRS Duration:  168 QT Interval:  470 QTC Calculation: 473 R Axis:   200  Text Interpretation: AV SEQUENTIAL PACEMAKER Biventricular pacemaker detected When compared with ECG of 01-Mar-2021 17:40, No significant change since last tracing Confirmed by Gaylia Kassel ([id number]) on 05/10/2023 2:23:58 PM   PPM Interrogation-  reviewed in detail today,  See PACEART report.  Device History: Abbott BiV PPM implanted 09/19/2015 for CHB  Risk Assessment/Calculations:    CHA2DS2-VASc Score = 2   This indicates a 2.2% annual risk of stroke. The patient's score is based upon: CHF History: 0 HTN History: 1 Diabetes History: 0 Stroke History: 0 Vascular Disease History: 0 Age Score: 1 Gender Score: 0            Physical Exam:   VS:  BP (!) 140/86 (BP Location: Left Arm, Patient  Position: Sitting, Cuff Size: Large)   Pulse 62   Ht 5\' 11"  (1.803 m)   Wt (!) 308 lb (139.7 kg)   SpO2 98%   BMI 42.96 kg/m    Wt Readings from Last 3 Encounters:  05/10/23 (!) 308 lb (139.7 kg)  10/20/22 (!) 300 lb 12.8 oz (136.4 kg)  05/08/22 (!) 313 lb (142 kg)     GEN: Well nourished, well developed in no acute distress NECK: No JVD; No carotid bruits CARDIAC: Regular rate and rhythm, no murmurs, rubs, gallops RESPIRATORY:  Clear to auscultation without rales, wheezing or rhonchi  ABDOMEN: Soft, non-tender, non-distended EXTREMITIES:  No edema; No deformity   ASSESSMENT AND PLAN:    CHB s/p Abbott PPM  Normal PPM function See Pace Art report ALTE point pacing turned off today to improve battery life.  2.  Coronary artery disease: No current chest pain.  Continue with management per primary cardiology.  3.  Hypertension: Mildly elevated but usually well-controlled.  Plan per primary cardiology  4.  Hyperlipidemia: Continue atorvastatin per primary cardiology  5.  Paroxysmal atrial fibrillation/flutter: Cardioversion in 2021.  6.  Secondary hypercoagulable state: Currently on Eliquis 5 mg twice daily for atrial fibrillation  7.  Obstructive sleep apnea: CPAP compliance encouraged  Disposition:   Follow up with EP APP in 12 months  Signed, Greogry Goodwyn Jorja Loa, MD

## 2023-05-21 ENCOUNTER — Ambulatory Visit (INDEPENDENT_AMBULATORY_CARE_PROVIDER_SITE_OTHER): Payer: Medicare Other

## 2023-05-31 ENCOUNTER — Encounter: Payer: Self-pay | Admitting: Cardiology

## 2023-06-12 NOTE — Addendum Note (Signed)
 Addended by: Geralyn Flash D on: 06/12/2023 02:36 PM   Modules accepted: Orders

## 2023-06-12 NOTE — Progress Notes (Signed)
 Remote pacemaker transmission.

## 2023-08-06 ENCOUNTER — Ambulatory Visit (INDEPENDENT_AMBULATORY_CARE_PROVIDER_SITE_OTHER): Payer: Medicare Other

## 2023-08-06 DIAGNOSIS — I442 Atrioventricular block, complete: Secondary | ICD-10-CM | POA: Diagnosis not present

## 2023-08-07 LAB — CUP PACEART REMOTE DEVICE CHECK
Battery Remaining Longevity: 75 mo
Battery Remaining Percentage: 80 %
Battery Voltage: 2.98 V
Brady Statistic AP VP Percent: 90 %
Brady Statistic AP VS Percent: 1 %
Brady Statistic AS VP Percent: 9.7 %
Brady Statistic AS VS Percent: 1 %
Brady Statistic RA Percent Paced: 90 %
Date Time Interrogation Session: 20250512031752
Implantable Lead Connection Status: 753985
Implantable Lead Connection Status: 753985
Implantable Lead Connection Status: 753985
Implantable Lead Implant Date: 20170615
Implantable Lead Implant Date: 20170615
Implantable Lead Implant Date: 20170615
Implantable Lead Location: 753858
Implantable Lead Location: 753859
Implantable Lead Location: 753860
Implantable Pulse Generator Implant Date: 20231109
Lead Channel Impedance Value: 450 Ohm
Lead Channel Impedance Value: 490 Ohm
Lead Channel Impedance Value: 950 Ohm
Lead Channel Pacing Threshold Amplitude: 0.5 V
Lead Channel Pacing Threshold Amplitude: 0.875 V
Lead Channel Pacing Threshold Amplitude: 1.5 V
Lead Channel Pacing Threshold Pulse Width: 0.4 ms
Lead Channel Pacing Threshold Pulse Width: 0.4 ms
Lead Channel Pacing Threshold Pulse Width: 1 ms
Lead Channel Sensing Intrinsic Amplitude: 5 mV
Lead Channel Sensing Intrinsic Amplitude: 9.1 mV
Lead Channel Setting Pacing Amplitude: 1.5 V
Lead Channel Setting Pacing Amplitude: 2 V
Lead Channel Setting Pacing Amplitude: 2 V
Lead Channel Setting Pacing Pulse Width: 0.4 ms
Lead Channel Setting Pacing Pulse Width: 1 ms
Lead Channel Setting Sensing Sensitivity: 5 mV
Pulse Gen Model: 3562
Pulse Gen Serial Number: 8121406

## 2023-08-09 ENCOUNTER — Ambulatory Visit: Payer: Self-pay | Admitting: Cardiology

## 2023-08-11 DIAGNOSIS — G4733 Obstructive sleep apnea (adult) (pediatric): Secondary | ICD-10-CM | POA: Diagnosis not present

## 2023-09-11 DIAGNOSIS — G4733 Obstructive sleep apnea (adult) (pediatric): Secondary | ICD-10-CM | POA: Diagnosis not present

## 2023-09-20 NOTE — Progress Notes (Signed)
 Remote pacemaker transmission.

## 2023-09-20 NOTE — Addendum Note (Signed)
 Addended by: TAWNI DRILLING D on: 09/20/2023 03:49 PM   Modules accepted: Orders

## 2023-10-04 ENCOUNTER — Other Ambulatory Visit: Payer: Self-pay | Admitting: Cardiology

## 2023-10-04 ENCOUNTER — Other Ambulatory Visit (HOSPITAL_BASED_OUTPATIENT_CLINIC_OR_DEPARTMENT_OTHER): Payer: Self-pay | Admitting: Cardiovascular Disease

## 2023-10-04 ENCOUNTER — Telehealth: Payer: Self-pay | Admitting: Family Medicine

## 2023-10-04 ENCOUNTER — Telehealth: Payer: Self-pay | Admitting: Cardiology

## 2023-10-04 DIAGNOSIS — I5022 Chronic systolic (congestive) heart failure: Secondary | ICD-10-CM

## 2023-10-04 DIAGNOSIS — I480 Paroxysmal atrial fibrillation: Secondary | ICD-10-CM

## 2023-10-04 MED ORDER — CARVEDILOL 12.5 MG PO TABS: 12.5000 mg | ORAL_TABLET | Freq: Two times a day (BID) | ORAL | 1 refills | Status: DC

## 2023-10-04 MED ORDER — APIXABAN 5 MG PO TABS: 5.0000 mg | ORAL_TABLET | Freq: Two times a day (BID) | ORAL | 1 refills | Status: AC

## 2023-10-04 NOTE — Addendum Note (Signed)
 Addended by: JOESPH CHROMAN B on: 10/04/2023 02:13 PM   Modules accepted: Orders

## 2023-10-04 NOTE — Telephone Encounter (Signed)
 Prescription refill request for Eliquis  received. Indication: Afib  Last office visit: 05/10/23 ([place])  Scr: 1.12 (10/20/22)  Age: 73 Weight: 139.7kg  Appropriate dose. Refill sent.

## 2023-10-04 NOTE — Telephone Encounter (Signed)
 Lvm for pt to return call.

## 2023-10-04 NOTE — Telephone Encounter (Signed)
 Copied from CRM [id number]. Topic: General - Other >> Oct 04, 2023 11:51 AM Harold Robertson wrote: Reason for CRM: Pt called would like to know when he received his TDAP vaccinations? Please call pt at [phone].

## 2023-10-04 NOTE — Telephone Encounter (Signed)
*  STAT* If patient is at the pharmacy, call can be transferred to refill team.   1. Which medications need to be refilled? (please list name of each medication and dose if known)   carvedilol  (COREG ) 12.5 MG tablet  (90 day) ELIQUIS  5 MG TABS tablet  (30 day)  2. Would you like to learn more about the convenience, safety, & potential cost savings by using the [facility]?   3. Are you open to using the [facility] (Type [facility]. ).  4. Which pharmacy/location (including street and city if local pharmacy) is medication to be sent to?  [id number] [name], [place] - [address]   5. Do they need a 30 day or 90 day supply?     Patient stated he only has a day or two left of these medications.

## 2023-10-05 ENCOUNTER — Telehealth: Payer: Self-pay

## 2023-10-05 NOTE — Telephone Encounter (Signed)
 Spoke with pt and informed him of his vaccination status.

## 2023-10-05 NOTE — Telephone Encounter (Signed)
 Copied from CRM [id number]. Topic: General - Other >> Oct 05, 2023 10:36 AM Willma SAUNDERS wrote: Reason for CRM: Patient returning Sheena's call in regards to his question about tdap.  Patient can be reached at [phone]

## 2023-10-05 NOTE — Telephone Encounter (Signed)
Second attempt: Lvm

## 2023-10-05 NOTE — Telephone Encounter (Signed)
Pt informed

## 2023-10-24 ENCOUNTER — Ambulatory Visit

## 2023-10-24 VITALS — Ht 71.0 in | Wt 308.0 lb

## 2023-10-24 DIAGNOSIS — Z0000 Encounter for general adult medical examination without abnormal findings: Secondary | ICD-10-CM | POA: Diagnosis not present

## 2023-10-24 DIAGNOSIS — Z1211 Encounter for screening for malignant neoplasm of colon: Secondary | ICD-10-CM

## 2023-10-24 NOTE — Patient Instructions (Signed)
 Harold Robertson , Thank you for taking time out of your busy schedule to complete your Annual Wellness Visit with me. I enjoyed our conversation and look forward to speaking with you again next year. I, as well as your care team,  appreciate your ongoing commitment to your health goals. Please review the following plan we discussed and let me know if I can assist you in the future. Your Game plan/ To Do List    Referrals: If you haven't heard from the office you've been referred to, please reach out to them at the phone provided.  [facility] [phone]  Follow up Visits: Next Medicare AWV with our clinical staff: In 1 year    Have you seen your provider in the last 6 months (3 months if uncontrolled diabetes)? No Next Office Visit with your provider: 11/09/23 @ 10:30  Clinician Recommendations:  Aim for 30 minutes of exercise or brisk walking, 6-8 glasses of water , and 5 servings of fruits and vegetables each day.       This is a list of the screening recommended for you and due dates:  Health Maintenance  Topic Date Due   Zoster (Shingles) Vaccine (1 of 2) Never done   COVID-19 Vaccine (3 - Pfizer risk series) 06/03/2019   Flu Shot  10/26/2023   Medicare Annual Wellness Visit  10/23/2024   Colon Cancer Screening  03/10/2025   DTaP/Tdap/Td vaccine (3 - Td or Tdap) 05/11/2026   Pneumococcal Vaccine for age over 1  Completed   Hepatitis C Screening  Completed   Hepatitis B Vaccine  Aged Out   HPV Vaccine  Aged Out   Meningitis B Vaccine  Aged Out    Advanced directives: (ACP Link)Information on Advanced Care Planning can be found at [place]  Best boy Advance Health Care Directives Advance Health Care Directives. [email or web address]   Advance Care Planning is important because it:  [x]  Makes sure you receive the medical care that is consistent with your values, goals, and preferences  [x]  It provides guidance to your family and loved ones and reduces their decisional  burden about whether or not they are making the right decisions based on your wishes.  Follow the link provided in your after visit summary or read over the paperwork we have mailed to you to help you started getting your Advance Directives in place. If you need assistance in completing these, please reach out to us  so that we can help you!  See attachments for Preventive Care and Fall Prevention Tips.

## 2023-10-24 NOTE — Progress Notes (Signed)
 Subjective:   Harold Robertson is a 73 y.o. who presents for a Medicare Wellness preventive visit.  As a reminder, Annual Wellness Visits don't include a physical exam, and some assessments may be limited, especially if this visit is performed virtually. We may recommend an in-person follow-up visit with your provider if needed.  Visit Complete: Virtual I connected with  Harold Robertson on 10/24/23 by a audio enabled telemedicine application and verified that I am speaking with the correct person using two identifiers.  Patient Location: Home  Provider Location: Home Office  I discussed the limitations of evaluation and management by telemedicine. The patient expressed understanding and agreed to proceed.  Vital Signs: Because this visit was a virtual/telehealth visit, some criteria may be missing or patient reported. Any vitals not documented were not able to be obtained and vitals that have been documented are patient reported.  VideoDeclined- This patient declined Librarian, academic. Therefore the visit was completed with audio only.  Persons Participating in Visit: Patient.  AWV Questionnaire: Yes: Patient Medicare AWV questionnaire was completed by the patient on 10/23/23; I have confirmed that all information answered by patient is correct and no changes since this date.  Cardiac Risk Factors include: advanced age (>65men, >37 women);dyslipidemia;hypertension;male gender;sedentary lifestyle     Objective:    Today's Vitals   10/24/23 0934  Weight: (!) 308 lb (139.7 kg)  Height: 5' 11 (1.803 m)   Body mass index is 42.96 kg/m.     10/24/2023    9:45 AM 04/04/2022   10:05 AM 02/02/2022    2:04 PM 03/22/2021    6:26 PM 03/17/2021   10:09 AM 03/01/2021    3:56 PM 01/28/2020   10:02 AM  Advanced Directives  Does Patient Have a Medical Advance Directive? No Yes Yes No No No Yes  Type of Advance Directive  Living will Living will    Living  will;Healthcare Power of Attorney  Does patient want to make changes to medical advance directive?  No - Patient declined No - Patient declined      Copy of Healthcare Power of Attorney in Chart?       No - copy requested  Would patient like information on creating a medical advance directive? Yes (MAU/Ambulatory/Procedural Areas - Information given)   No - Patient declined       Current Medications (verified) Outpatient Encounter Medications as of 10/24/2023  Medication Sig   acetaminophen  (TYLENOL ) 500 MG tablet Take 2 tablets (1,000 mg total) by mouth every 6 (six) hours as needed for mild pain or moderate pain. (Patient taking differently: Take 1,000 mg by mouth every 8 (eight) hours as needed for mild pain (pain score 1-3) or moderate pain (pain score 4-6).)   apixaban  (ELIQUIS ) 5 MG TABS tablet Take 1 tablet (5 mg total) by mouth 2 (two) times daily.   atorvastatin  (LIPITOR) 40 MG tablet Take 1 tablet (40 mg total) by mouth daily at 6 PM.   carvedilol  (COREG ) 12.5 MG tablet Take 1 tablet (12.5 mg total) by mouth 2 (two) times daily.   cholecalciferol (VITAMIN D ) 25 MCG (1000 UNIT) tablet Take 1,000 Units by mouth daily.   lisinopril  (ZESTRIL ) 20 MG tablet Take 1 tablet (20 mg total) by mouth daily.   No facility-administered encounter medications on file as of 10/24/2023.    Allergies (verified) Patient has no known allergies.   History: Past Medical History:  Diagnosis Date   Arthritis    Atrial  fibrillation (HCC)    Complete heart block (HCC)    Hypertension    OSA on CPAP    Presence of permanent cardiac pacemaker    Sleep apnea    Past Surgical History:  Procedure Laterality Date   APPENDECTOMY  1960s   CARDIAC CATHETERIZATION Right 09/08/2015   Procedure: Temporary Pacemaker;  Surgeon: Will Gladis Norton, MD;  Location: [facility] INVASIVE CV LAB;  Service: Cardiovascular;  Laterality: Right;   CARDIAC CATHETERIZATION N/A 09/09/2015   Procedure: Left Heart Cath and Coronary  Angiography;  Surgeon: Peter M Swaziland, MD;  Location: [facility] INVASIVE CV LAB;  Service: Cardiovascular;  Laterality: N/A;   CARDIOVERSION N/A 01/28/2020   Procedure: CARDIOVERSION;  Surgeon: Jeffrie Oneil BROCKS, MD;  Location: [facility] ENDOSCOPY;  Service: Cardiovascular;  Laterality: N/A;   CHEILECTOMY Left 03/15/2018   Procedure: CHEILECTOMY LEFT FOOT;  Surgeon: Beverley Evalene BIRCH, MD;  Location: [facility];  Service: Orthopedics;  Laterality: Left;   COLONOSCOPY     CYSTOSCOPY N/A 03/22/2021   Procedure: CYSTOSCOPY FLEXIBLE; ATTEMPTED FOLEY INSERTION;  Surgeon: Beverley Evalene BIRCH, MD;  Location: [facility];  Service: Orthopedics;  Laterality: N/A;   EP IMPLANTABLE DEVICE N/A 09/09/2015   Procedure: BiV Pacemaker Insertion CRT-P;  Surgeon: Will Gladis Norton, MD;  Location: [facility];  Service: Cardiovascular;  Laterality: N/A;   JOINT REPLACEMENT     KNEE SURGERY Right    PPM GENERATOR CHANGEOUT N/A 02/02/2022   Procedure: PPM GENERATOR CHANGEOUT;  Surgeon: Norton Soyla Gladis, MD;  Location: [facility];  Service: Cardiovascular;  Laterality: N/A;   TOTAL KNEE ARTHROPLASTY Right 07/11/2016   TOTAL KNEE ARTHROPLASTY Right 07/11/2016   Procedure: TOTAL KNEE ARTHROPLASTY;  Surgeon: Evalene BIRCH Beverley, MD;  Location: [facility];  Service: Orthopedics;  Laterality: Right;   TOTAL KNEE ARTHROPLASTY WITH REVISION COMPONENTS Right 03/22/2021   Procedure: TOTAL KNEE ARTHROPLASTY WITH REVISION COMPONENTS (POLY EXCHANGE); I&D;  Surgeon: Beverley Evalene BIRCH, MD;  Location: [facility];  Service: Orthopedics;  Laterality: Right;   Family History  Problem Relation Age of Onset   Heart disease Father 20       AMI   Liver cancer Mother    Cancer Mother 36       Colon cancer with liver mets   Heart disease Brother 88       AMI   Cancer Brother    Heart disease Maternal Grandmother    Social History   Socioeconomic History   Marital status: Married    Spouse name: Not on file   Number of  children: Not on file   Years of education: Not on file   Highest education level: Bachelor's degree (e.g., BA, AB, BS)  Occupational History   Occupation: truck driver  Tobacco Use   Smoking status: Former    Types: Cigars   Smokeless tobacco: Current    Types: Snuff   Tobacco comments:    occ  Vaping Use   Vaping status: Never Used  Substance and Sexual Activity   Alcohol use: Yes    Alcohol/week: 1.0 standard drink of alcohol    Types: 1 Cans of beer per week    Comment: occ   Drug use: No   Sexual activity: Yes  Other Topics Concern   Not on file  Social History Narrative   Marital status: Married x 30 years; first marriage     Children: 2 children; multiple grandchildren      Lives: lives with wife  Employment: truck Hospital doctor x 14 years; [place]        Tobacco:  None; pipes and cigars; chews tobacco.       Alcohol:  Weekends.      Drugs:  None      Exercise:  Rarely.      Seatbelt:  100%      Guns:  Loaded and secured.   Social Drivers of Corporate investment banker Strain: Low Risk  (10/23/2023)   Overall Financial Resource Strain (CARDIA)    Difficulty of Paying Living Expenses: Not hard at all  Food Insecurity: No Food Insecurity (10/23/2023)   Hunger Vital Sign    Worried About Running Out of Food in the Last Year: Never true    Ran Out of Food in the Last Year: Never true  Transportation Needs: No Transportation Needs (10/23/2023)   PRAPARE - Administrator, Civil Service (Medical): No    Lack of Transportation (Non-Medical): No  Physical Activity: Insufficiently Active (10/23/2023)   Exercise Vital Sign    Days of Exercise per Week: 2 days    Minutes of Exercise per Session: 60 min  Stress: No Stress Concern Present (10/23/2023)   [employer] of Occupational Health - Occupational Stress Questionnaire    Feeling of Stress: Not at all  Social Connections: Moderately Isolated (10/23/2023)   Social Connection and Isolation Panel     Frequency of Communication with Friends and Family: More than three times a week    Frequency of Social Gatherings with Friends and Family: Once a week    Attends Religious Services: Never    Database administrator or Organizations: No    Attends Engineer, structural: Not on file    Marital Status: Married    Tobacco Counseling Ready to quit: Not Answered Counseling given: Not Answered Tobacco comments: occ    Clinical Intake:  Pre-visit preparation completed: Yes  Pain : No/denies pain  Diabetes: No  Lab Results  Component Value Date   HGBA1C 6.2 (H) 03/23/2021   HGBA1C 5.7 (H) 08/03/2014     How often do you need to have someone help you when you read instructions, pamphlets, or other written materials from your doctor or pharmacy?: 1 - Never  Interpreter Needed?: No  Information entered by :: Charmaine Bloodgood LPN   Activities of Daily Living     10/24/2023    9:43 AM  In your present state of health, do you have any difficulty performing the following activities:  Hearing? 0  Vision? 0  Difficulty concentrating or making decisions? 0  Walking or climbing stairs? 0  Dressing or bathing? 0  Doing errands, shopping? 0  Preparing Food and eating ? N  Using the Toilet? N  In the past six months, have you accidently leaked urine? N  Do you have problems with loss of bowel control? N  Managing your Medications? N  Managing your Finances? N  Housekeeping or managing your Housekeeping? N    Patient Care Team: Duanne Butler DASEN, MD as PCP - General (Family Medicine) Raford Riggs, MD as PCP - Cardiology (Cardiology) Inocencio Soyla Lunger, MD as PCP - Electrophysiology (Cardiology) Prentiss Prentice SAILOR, DO (Inactive) as Consulting Physician (Infectious Diseases) Beverley Evalene BIRCH, MD as Attending Physician (Orthopedic Surgery)  I have updated your Care Teams any recent Medical Services you may have received from other providers in the past year.      Assessment:   This is a routine  wellness examination for [place].  Hearing/Vision screen Hearing Screening - Comments:: Some hearing difficulties   Vision Screening - Comments:: up to date with routine eye exams    Goals Addressed             This Visit's Progress    Increase physical activity   On track      Depression Screen     10/24/2023    9:45 AM 04/04/2022   10:01 AM 04/03/2022    8:30 AM 11/01/2021    8:43 AM 08/02/2021    8:33 AM 04/13/2021    2:53 PM 03/17/2021   11:08 AM  PHQ 2/9 Scores  PHQ - 2 Score 0 0 0 0 0 0 0  PHQ- 9 Score       0    Fall Risk     10/24/2023    9:47 AM 04/04/2022   10:00 AM 04/03/2022    3:26 PM 04/03/2022    8:30 AM 11/01/2021    8:43 AM  Fall Risk   Falls in the past year? 0 0 0 0 0  Number falls in past yr: 0 0  0 0  Injury with Fall? 0 0  0 0  Risk for fall due to : No Fall Risks No Fall Risks  No Fall Risks   Follow up Falls prevention discussed;Education provided;Falls evaluation completed Falls evaluation completed;Education provided;Falls prevention discussed   Falls evaluation completed       Data saved with a previous flowsheet row definition    MEDICARE RISK AT HOME:  Medicare Risk at Home Any stairs in or around the home?: Yes If so, are there any without handrails?: No Home free of loose throw rugs in walkways, pet beds, electrical cords, etc?: Yes Adequate lighting in your home to reduce risk of falls?: Yes Life alert?: No Use of a cane, walker or w/c?: No Grab bars in the bathroom?: Yes Shower chair or bench in shower?: No Elevated toilet seat or a handicapped toilet?: Yes  TIMED UP AND GO:  Was the test performed?  No  Cognitive Function: Declined/Normal: No cognitive concerns noted by patient or family. Patient alert, oriented, able to answer questions appropriately and recall recent events. No signs of memory loss or confusion.        04/04/2022   10:06 AM  6CIT Screen  What Year? 0 points  What month? 0 points   What time? 0 points  Count back from 20 0 points  Months in reverse 0 points  Repeat phrase 0 points  Total Score 0 points    Immunizations Immunization History  Administered Date(s) Administered   Fluad Quad(high Dose 65+) 12/04/2018, 04/04/2022   Influenza Split 01/31/2013, 03/17/2015   Influenza, High Dose Seasonal PF 12/18/2017   Influenza,inj,Quad PF,6+ Mos 01/14/2016, 11/16/2016   Influenza-Unspecified 01/02/2014   PFIZER(Purple Top)SARS-COV-2 Vaccination 04/15/2019, 05/06/2019   PNEUMOCOCCAL CONJUGATE-20 07/08/2020   Pneumococcal Conjugate-13 11/04/2018   Pneumococcal Polysaccharide-23 01/28/2018   Tdap 01/14/2016, 05/11/2016    Screening Tests Health Maintenance  Topic Date Due   Zoster Vaccines- Shingrix (1 of 2) Never done   COVID-19 Vaccine (3 - Pfizer risk series) 06/03/2019   INFLUENZA VACCINE  10/26/2023   Medicare Annual Wellness (AWV)  10/23/2024   Colonoscopy  03/10/2025   DTaP/Tdap/Td (3 - Td or Tdap) 05/11/2026   Pneumococcal Vaccine: 50+ Years  Completed   Hepatitis C Screening  Completed   Hepatitis B Vaccines  Aged Out   HPV VACCINES  Aged  Out   Meningococcal B Vaccine  Aged Out    Health Maintenance  Health Maintenance Due  Topic Date Due   Zoster Vaccines- Shingrix (1 of 2) Never done   COVID-19 Vaccine (3 - Pfizer risk series) 06/03/2019   Health Maintenance Items Addressed: Referral sent to GI for colonoscopy; information provided on Shingrix   Additional Screening:  Vision Screening: Recommended annual ophthalmology exams for early detection of glaucoma and other disorders of the eye. Would you like a referral to an eye doctor? No    Dental Screening: Recommended annual dental exams for proper oral hygiene  Community Resource Referral / Chronic Care Management: CRR required this visit?  No   CCM required this visit?  No   Plan:    I have personally reviewed and noted the following in the patient's chart:   Medical and  social history Use of alcohol, tobacco or illicit drugs  Current medications and supplements including opioid prescriptions. Patient is not currently taking opioid prescriptions. Functional ability and status Nutritional status Physical activity Advanced directives List of other physicians Hospitalizations, surgeries, and ER visits in previous 12 months Vitals Screenings to include cognitive, depression, and falls Referrals and appointments  In addition, I have reviewed and discussed with patient certain preventive protocols, quality metrics, and best practice recommendations. A written personalized care plan for preventive services as well as general preventive health recommendations were provided to patient.   Lavelle Pfeiffer Roff, [place]   2/[id number]/7974   After Visit Summary: (MyChart) Due to this being a telephonic visit, the after visit summary with patients personalized plan was offered to patient via MyChart   Notes: Nothing significant to report at this time.

## 2023-11-05 ENCOUNTER — Ambulatory Visit (INDEPENDENT_AMBULATORY_CARE_PROVIDER_SITE_OTHER): Payer: Medicare Other

## 2023-11-05 DIAGNOSIS — I442 Atrioventricular block, complete: Secondary | ICD-10-CM | POA: Diagnosis not present

## 2023-11-09 ENCOUNTER — Ambulatory Visit: Admitting: Family Medicine

## 2023-11-09 ENCOUNTER — Encounter: Payer: Self-pay | Admitting: Family Medicine

## 2023-11-09 VITALS — BP 122/76 | HR 72 | Temp 98.2°F | Ht 71.0 in | Wt 312.0 lb

## 2023-11-09 DIAGNOSIS — I5020 Unspecified systolic (congestive) heart failure: Secondary | ICD-10-CM

## 2023-11-09 DIAGNOSIS — I10 Essential (primary) hypertension: Secondary | ICD-10-CM

## 2023-11-09 DIAGNOSIS — R7303 Prediabetes: Secondary | ICD-10-CM

## 2023-11-09 DIAGNOSIS — Z23 Encounter for immunization: Secondary | ICD-10-CM

## 2023-11-09 DIAGNOSIS — E7800 Pure hypercholesterolemia, unspecified: Secondary | ICD-10-CM

## 2023-11-09 DIAGNOSIS — Z0000 Encounter for general adult medical examination without abnormal findings: Secondary | ICD-10-CM

## 2023-11-09 DIAGNOSIS — Z0001 Encounter for general adult medical examination with abnormal findings: Secondary | ICD-10-CM

## 2023-11-09 DIAGNOSIS — Z125 Encounter for screening for malignant neoplasm of prostate: Secondary | ICD-10-CM

## 2023-11-09 LAB — CUP PACEART REMOTE DEVICE CHECK
Battery Remaining Longevity: 73 mo
Battery Remaining Percentage: 76 %
Battery Voltage: 2.98 V
Brady Statistic AP VP Percent: 88 %
Brady Statistic AP VS Percent: 1 %
Brady Statistic AS VP Percent: 12 %
Brady Statistic AS VS Percent: 1 %
Brady Statistic RA Percent Paced: 88 %
Date Time Interrogation Session: 20250814160232
Implantable Lead Connection Status: 753985
Implantable Lead Connection Status: 753985
Implantable Lead Connection Status: 753985
Implantable Lead Implant Date: 20170615
Implantable Lead Implant Date: 20170615
Implantable Lead Implant Date: 20170615
Implantable Lead Location: 753858
Implantable Lead Location: 753859
Implantable Lead Location: 753860
Implantable Pulse Generator Implant Date: 20231109
Lead Channel Impedance Value: 490 Ohm
Lead Channel Impedance Value: 490 Ohm
Lead Channel Impedance Value: 960 Ohm
Lead Channel Pacing Threshold Amplitude: 0.5 V
Lead Channel Pacing Threshold Amplitude: 1 V
Lead Channel Pacing Threshold Amplitude: 1.5 V
Lead Channel Pacing Threshold Pulse Width: 0.4 ms
Lead Channel Pacing Threshold Pulse Width: 0.4 ms
Lead Channel Pacing Threshold Pulse Width: 1 ms
Lead Channel Sensing Intrinsic Amplitude: 12 mV
Lead Channel Sensing Intrinsic Amplitude: 5 mV
Lead Channel Setting Pacing Amplitude: 1.5 V
Lead Channel Setting Pacing Amplitude: 2 V
Lead Channel Setting Pacing Amplitude: 2 V
Lead Channel Setting Pacing Pulse Width: 0.4 ms
Lead Channel Setting Pacing Pulse Width: 1 ms
Lead Channel Setting Sensing Sensitivity: 5 mV
Pulse Gen Model: 3562
Pulse Gen Serial Number: 8121406

## 2023-11-09 MED ORDER — ZEPBOUND 2.5 MG/0.5ML ~~LOC~~ SOAJ: 2.5000 mg | SUBCUTANEOUS | 3 refills | Status: AC

## 2023-11-09 NOTE — Progress Notes (Signed)
 Subjective:    Patient ID: Harold Robertson, male    DOB: [birth year], 73 y.o.   MRN: [id number]  HPI  Patient is here today for complete physical exam.  Past medical history is significant for atrioventricular block, complete heart block, requiring pacemaker placement.  Apparently he was hospitalized with a heart rate in the 20s per his report requiring pacemaker placement.  At that time in 2017, his ejection fraction was 40 to 45% on echocardiogram.  He is done well since his pacemaker has been placed and follows up regularly with his EP specialist Dr. Inocencio.    Had colonoscopy in 2016 with [facility].  States found tubular adenoma.  Recommended repeat colonoscopy in 5 years.  He has not yet had the colonoscopy.  [facility] contacted him.  I encouraged him to follow-up with them and make the appointment.  He has known obstructive sleep apnea.  His weight has increased over 300 pounds. Wt Readings from Last 3 Encounters:  11/09/23 (!) 312 lb (141.5 kg)  10/24/23 (!) 308 lb (139.7 kg)  05/10/23 (!) 308 lb (139.7 kg)   He continues to gain weight.  He is sedentary.  He is not exercising.  He is trying to watch his diet.  He is due for prostate cancer screening.  He is due for the shingles vaccine.  He is due for RSV vaccine.  He is due for a flu shot and a COVID shot in the fall.  Last blood work in July showed mild prediabetes.  He is overdue to check a hemoglobin A1c  Immunization History  Administered Date(s) Administered   Fluad Quad(high Dose 65+) 12/04/2018, 04/04/2022   Influenza Split 01/31/2013, 03/17/2015   Influenza, High Dose Seasonal PF 12/18/2017   Influenza,inj,Quad PF,6+ Mos 01/14/2016, 11/16/2016   Influenza-Unspecified 01/02/2014   PFIZER(Purple Top)SARS-COV-2 Vaccination 04/15/2019, 05/06/2019   PNEUMOCOCCAL CONJUGATE-20 07/08/2020   Pneumococcal Conjugate-13 11/04/2018   Pneumococcal Polysaccharide-23 01/28/2018   Tdap 01/14/2016, 05/11/2016   Zoster Recombinant(Shingrix )  11/09/2023    Past Medical History:  Diagnosis Date   Arthritis    Atrial fibrillation (HCC)    Complete heart block (HCC)    Hypertension    OSA on CPAP    Presence of permanent cardiac pacemaker    Sleep apnea    Past Surgical History:  Procedure Laterality Date   APPENDECTOMY  1960s   CARDIAC CATHETERIZATION Right 09/08/2015   Procedure: Temporary Pacemaker;  Surgeon: Will Gladis Inocencio, MD;  Location: [facility] INVASIVE CV LAB;  Service: Cardiovascular;  Laterality: Right;   CARDIAC CATHETERIZATION N/A 09/09/2015   Procedure: Left Heart Cath and Coronary Angiography;  Surgeon: Peter M Swaziland, MD;  Location: [facility] INVASIVE CV LAB;  Service: Cardiovascular;  Laterality: N/A;   CARDIOVERSION N/A 01/28/2020   Procedure: CARDIOVERSION;  Surgeon: Jeffrie Oneil BROCKS, MD;  Location: [facility] ENDOSCOPY;  Service: Cardiovascular;  Laterality: N/A;   CHEILECTOMY Left 03/15/2018   Procedure: CHEILECTOMY LEFT FOOT;  Surgeon: Beverley Evalene BIRCH, MD;  Location: [facility];  Service: Orthopedics;  Laterality: Left;   COLONOSCOPY     CYSTOSCOPY N/A 03/22/2021   Procedure: CYSTOSCOPY FLEXIBLE; ATTEMPTED FOLEY INSERTION;  Surgeon: Beverley Evalene BIRCH, MD;  Location: [facility];  Service: Orthopedics;  Laterality: N/A;   EP IMPLANTABLE DEVICE N/A 09/09/2015   Procedure: BiV Pacemaker Insertion CRT-P;  Surgeon: Will Gladis Inocencio, MD;  Location: [facility] INVASIVE CV LAB;  Service: Cardiovascular;  Laterality: N/A;   JOINT REPLACEMENT     KNEE SURGERY Right  PPM GENERATOR CHANGEOUT N/A 02/02/2022   Procedure: PPM GENERATOR CHANGEOUT;  Surgeon: Inocencio Soyla Lunger, MD;  Location: [facility] INVASIVE CV LAB;  Service: Cardiovascular;  Laterality: N/A;   TOTAL KNEE ARTHROPLASTY Right 07/11/2016   TOTAL KNEE ARTHROPLASTY Right 07/11/2016   Procedure: TOTAL KNEE ARTHROPLASTY;  Surgeon: Evalene JONETTA Chancy, MD;  Location: [facility] OR;  Service: Orthopedics;  Laterality: Right;   TOTAL KNEE ARTHROPLASTY WITH REVISION COMPONENTS Right  03/22/2021   Procedure: TOTAL KNEE ARTHROPLASTY WITH REVISION COMPONENTS (POLY EXCHANGE); I&D;  Surgeon: Chancy Evalene JONETTA, MD;  Location: [facility];  Service: Orthopedics;  Laterality: Right;   Current Outpatient Medications on File Prior to Visit  Medication Sig Dispense Refill   acetaminophen (TYLENOL) 500 MG tablet Take 2 tablets (1,000 mg total) by mouth every 6 (six) hours as needed for mild pain or moderate pain. (Patient taking differently: Take 1,000 mg by mouth every 8 (eight) hours as needed for mild pain (pain score 1-3) or moderate pain (pain score 4-6).) 60 tablet 0   apixaban (ELIQUIS) 5 MG TABS tablet Take 1 tablet (5 mg total) by mouth 2 (two) times daily. 180 tablet 1   atorvastatin (LIPITOR) 40 MG tablet Take 1 tablet (40 mg total) by mouth daily at 6 PM. 90 tablet 3   carvedilol (COREG) 12.5 MG tablet Take 1 tablet (12.5 mg total) by mouth 2 (two) times daily. 180 tablet 1   cholecalciferol (VITAMIN D) 25 MCG (1000 UNIT) tablet Take 1,000 Units by mouth daily.     lisinopril (ZESTRIL) 20 MG tablet Take 1 tablet (20 mg total) by mouth daily. 90 tablet 3   No current facility-administered medications on file prior to visit.   No Known Allergies Social History   Socioeconomic History   Marital status: Married    Spouse name: Not on file   Number of children: Not on file   Years of education: Not on file   Highest education level: Bachelor's degree (e.g., BA, AB, BS)  Occupational History   Occupation: truck driver  Tobacco Use   Smoking status: Former    Types: Cigars   Smokeless tobacco: Current    Types: Snuff   Tobacco comments:    occ  Vaping Use   Vaping status: Never Used  Substance and Sexual Activity   Alcohol use: Yes    Alcohol/week: 1.0 standard drink of alcohol    Types: 1 Cans of beer per week    Comment: occ   Drug use: No   Sexual activity: Yes  Other Topics Concern   Not on file  Social History Narrative   Marital status: Married x 30 years;  first marriage     Children: 2 children; multiple grandchildren-6     Lives: lives with wife      Employment: truck driver x 14 years; [place]        Tobacco:  None; pipes and cigars; chews tobacco.       Alcohol:  Weekends.      Drugs:  None      Exercise:  Rarely.      Seatbelt:  100%      Guns:  Loaded and secured.   Social Drivers of Corporate investment banker Strain: Low Risk  (10/23/2023)   Overall Financial Resource Strain (CARDIA)    Difficulty of Paying Living Expenses: Not hard at all  Food Insecurity: No Food Insecurity (10/23/2023)   Hunger Vital Sign    Worried About Running Out of Food  in the Last Year: Never true    Ran Out of Food in the Last Year: Never true  Transportation Needs: No Transportation Needs (10/23/2023)   PRAPARE - Administrator, Civil Service (Medical): No    Lack of Transportation (Non-Medical): No  Physical Activity: Insufficiently Active (10/23/2023)   Exercise Vital Sign    Days of Exercise per Week: 2 days    Minutes of Exercise per Session: 60 min  Stress: No Stress Concern Present (10/23/2023)   [employer] of Occupational Health - Occupational Stress Questionnaire    Feeling of Stress: Not at all  Social Connections: Moderately Isolated (10/23/2023)   Social Connection and Isolation Panel    Frequency of Communication with Friends and Family: More than three times a week    Frequency of Social Gatherings with Friends and Family: Once a week    Attends Religious Services: Never    Database administrator or Organizations: No    Attends Engineer, structural: Not on file    Marital Status: Married  Catering manager Violence: Not At Risk (10/24/2023)   Humiliation, Afraid, Rape, and Kick questionnaire    Fear of Current or Ex-Partner: No    Emotionally Abused: No    Physically Abused: No    Sexually Abused: No   Family History  Problem Relation Age of Onset   Heart disease Father 71       AMI   Liver cancer  Mother    Cancer Mother 45       Colon cancer with liver mets   Heart disease Brother 31       AMI   Cancer Brother    Heart disease Maternal Grandmother       Review of Systems  All other systems reviewed and are negative.      Objective:   Physical Exam Vitals reviewed.  Constitutional:      General: He is not in acute distress.    Appearance: Normal appearance. He is well-developed. He is obese. He is not ill-appearing, toxic-appearing or diaphoretic.  HENT:     Head: Normocephalic and atraumatic.     Right Ear: Tympanic membrane, ear canal and external ear normal.     Left Ear: Tympanic membrane, ear canal and external ear normal.     Nose: Nose normal. No congestion or rhinorrhea.     Mouth/Throat:     Pharynx: Oropharynx is clear. No oropharyngeal exudate or posterior oropharyngeal erythema.  Eyes:     General: No scleral icterus.       Right eye: No discharge.        Left eye: No discharge.     Extraocular Movements: Extraocular movements intact.     Conjunctiva/sclera: Conjunctivae normal.     Pupils: Pupils are equal, round, and reactive to light.  Neck:     Thyroid : No thyromegaly.     Vascular: No carotid bruit.  Cardiovascular:     Rate and Rhythm: Normal rate and regular rhythm.     Pulses: Normal pulses.     Heart sounds: Normal heart sounds. No murmur heard.    No friction rub. No gallop.  Pulmonary:     Effort: Pulmonary effort is normal. No respiratory distress.     Breath sounds: Normal breath sounds. No stridor. No wheezing, rhonchi or rales.  Chest:     Chest wall: No tenderness.  Abdominal:     General: Bowel sounds are normal. There is no distension.  Palpations: Abdomen is soft. There is no mass.     Tenderness: There is no abdominal tenderness. There is no guarding or rebound.     Hernia: No hernia is present.  Musculoskeletal:        General: No deformity.     Cervical back: Normal range of motion and neck supple. No rigidity. No  muscular tenderness.     Right lower leg: No edema.     Left lower leg: No edema.  Lymphadenopathy:     Cervical: No cervical adenopathy.  Skin:    Coloration: Skin is not jaundiced.     Findings: No bruising, erythema, lesion or rash.  Neurological:     General: No focal deficit present.     Mental Status: He is alert and oriented to person, place, and time.     Cranial Nerves: No cranial nerve deficit.     Sensory: No sensory deficit.     Motor: No weakness.     Coordination: Coordination normal.     Gait: Gait normal.     Deep Tendon Reflexes: Reflexes normal.  Psychiatric:        Mood and Affect: Mood normal.        Behavior: Behavior normal.        Thought Content: Thought content normal.        Judgment: Judgment normal.           Assessment & Plan:  Pure hypercholesterolemia - Plan: CBC with Differential/Platelet, Comprehensive metabolic panel with GFR, Lipid panel  Prediabetes - Plan: Hemoglobin A1c  Prostate cancer screening - Plan: PSA  Need for vaccination - Plan: Varicella-zoster vaccine IM  Systolic congestive heart failure, NYHA class 1, unspecified congestive heart failure chronicity (HCC)  Encounter for Medicare annual wellness exam  Primary hypertension First regarding his immunizations, the patient received his shingles vaccine.  Recommended RSV and flu in the fall.  Second regarding his cancer screening I encouraged him to contact his gastroenterologist with the [facility].  They have already reached out and try to schedule a colonoscopy with him.  He has their callback number.  I encouraged him to contact them.  I will check a PSA to screen for prostate cancer.  Given his weight, his history of obstructive sleep apnea, and his prediabetes, I will try the patient on Zepbound  2.5 mg subcu weekly to facilitate this and help to treat his obstructive sleep apnea.  Blood pressure today is adequately controlled.  Check CBC CMP and lipid panel.  Ideally I would like  LDL cholesterol less than 899.  Monitor prediabetes with a hemoglobin A1c

## 2023-11-10 ENCOUNTER — Ambulatory Visit: Payer: Self-pay | Admitting: Cardiology

## 2023-11-10 LAB — COMPREHENSIVE METABOLIC PANEL WITH GFR
AG Ratio: 1.8 (calc) (ref 1.0–2.5)
ALT: 22 U/L (ref 9–46)
AST: 15 U/L (ref 10–35)
Albumin: 4 g/dL (ref 3.6–5.1)
Alkaline phosphatase (APISO): 69 U/L (ref 35–144)
BUN: 16 mg/dL (ref 7–25)
CO2: 26 mmol/L (ref 20–32)
Calcium: 9.3 mg/dL (ref 8.6–10.3)
Chloride: 107 mmol/L (ref 98–110)
Creat: 1.13 mg/dL (ref 0.70–1.28)
Globulin: 2.2 g/dL (ref 1.9–3.7)
Glucose, Bld: 132 mg/dL — ABNORMAL HIGH (ref 65–99)
Potassium: 4.2 mmol/L (ref 3.5–5.3)
Sodium: 141 mmol/L (ref 135–146)
Total Bilirubin: 1.3 mg/dL — ABNORMAL HIGH (ref 0.2–1.2)
Total Protein: 6.2 g/dL (ref 6.1–8.1)
eGFR: 69 mL/min/1.73m2 (ref >=60)

## 2023-11-10 LAB — CBC WITH DIFFERENTIAL/PLATELET
Absolute Lymphocytes: 1454 {cells}/uL (ref 850–3900)
Absolute Monocytes: 410 {cells}/uL (ref 200–950)
Basophils Absolute: 29 {cells}/uL (ref 0–200)
Basophils Relative: 0.5 %
Eosinophils Absolute: 143 {cells}/uL (ref 15–500)
Eosinophils Relative: 2.5 %
HCT: 46.5 % (ref 38.5–50.0)
Hemoglobin: 15.4 g/dL (ref 13.2–17.1)
MCH: 30.1 pg (ref 27.0–33.0)
MCHC: 33.1 g/dL (ref 32.0–36.0)
MCV: 91 fL (ref 80.0–100.0)
MPV: 10.7 fL (ref 7.5–12.5)
Monocytes Relative: 7.2 %
Neutro Abs: 3665 {cells}/uL (ref 1500–7800)
Neutrophils Relative %: 64.3 %
Platelets: 178 Thousand/uL (ref 140–400)
RBC: 5.11 Million/uL (ref 4.20–5.80)
RDW: 12.7 % (ref 11.0–15.0)
Total Lymphocyte: 25.5 %
WBC: 5.7 Thousand/uL (ref 3.8–10.8)

## 2023-11-10 LAB — HEMOGLOBIN A1C
Hgb A1c MFr Bld: 6.2 % — ABNORMAL HIGH (ref <5.7)
Mean Plasma Glucose: 131 mg/dL
eAG (mmol/L): 7.3 mmol/L

## 2023-11-10 LAB — LIPID PANEL
Cholesterol: 149 mg/dL (ref <200)
HDL: 45 mg/dL (ref >=40)
LDL Cholesterol (Calc): 81 mg/dL
Non-HDL Cholesterol (Calc): 104 mg/dL (ref <130)
Total CHOL/HDL Ratio: 3.3 (calc) (ref <5.0)
Triglycerides: 132 mg/dL (ref <150)

## 2023-11-10 LAB — PSA: PSA: 1.32 ng/mL (ref <=4.00)

## 2023-11-12 ENCOUNTER — Ambulatory Visit: Payer: Self-pay | Admitting: Family Medicine

## 2023-11-12 ENCOUNTER — Telehealth: Payer: Self-pay | Admitting: Pharmacy Technician

## 2023-11-12 ENCOUNTER — Other Ambulatory Visit (HOSPITAL_COMMUNITY): Payer: Self-pay

## 2023-11-12 NOTE — Telephone Encounter (Signed)
 Pharmacy Patient Advocate Encounter   Received notification from Onbase that prior authorization for Zepbound  2.5MG /0.5ML pen-injectors  is required/requested.   Insurance verification completed.   The patient is insured through [facility] .   Per test claim: PA required; PA submitted to above mentioned insurance via Latent Key/confirmation #/EOC [id number] Status is pending

## 2023-11-13 ENCOUNTER — Other Ambulatory Visit (HOSPITAL_COMMUNITY): Payer: Self-pay

## 2023-11-13 NOTE — Telephone Encounter (Signed)
 Pharmacy Patient Advocate Encounter  Received notification from [facility] that Prior Authorization for Zepbound  2.5MG /0.5ML pen-injectors  has been APPROVED from 11/12/23 to 11/11/24. Unable to obtain price due to refill too soon rejection, last fill date 11/12/23 next available fill date09/08/25   PA #/Case ID/Reference #: [id number]

## 2023-11-13 NOTE — Telephone Encounter (Signed)
 Copied from CRM #[id number]. Topic: Clinical - Medication Prior Auth >> Nov 12, 2023  4:45 PM Deleta RAMAN wrote: Reason for CRM: Terri from [employer] is calling to notify patient zepbound  prior Shara was approved

## 2023-12-04 DIAGNOSIS — I4891 Unspecified atrial fibrillation: Secondary | ICD-10-CM | POA: Diagnosis not present

## 2023-12-04 DIAGNOSIS — Z86018 Personal history of other benign neoplasm: Secondary | ICD-10-CM | POA: Diagnosis not present

## 2023-12-04 DIAGNOSIS — Z7901 Long term (current) use of anticoagulants: Secondary | ICD-10-CM | POA: Diagnosis not present

## 2023-12-19 ENCOUNTER — Telehealth: Payer: Self-pay

## 2023-12-19 NOTE — Telephone Encounter (Signed)
   Pre-operative Risk Assessment    Patient Name: Harold Robertson  DOB: August 31, 1950 MRN: [id number]   Date of last office visit: 05/10/23 WILL CAMNITZ, MD Date of next office visit: NONE   Request for Surgical Clearance    Procedure:  COLONOSCOPY  Date of Surgery:  Clearance 01/22/24                                Surgeon:  DR ESTELITA MANAS Surgeon's Group or Practice Name:  [facility] Phone number:  [phone] Fax number:  [phone]   Type of Clearance Requested:   - Medical  - Pharmacy:  Hold Apixaban  (Eliquis ) 2 DAYS PRIOR   Type of Anesthesia:  PROPOFOL    Additional requests/questions:    Signed, Lucie DELENA Ku   12/19/2023, 3:12 PM

## 2023-12-19 NOTE — Telephone Encounter (Signed)
 Pharmacy please advise on holding Eliquis  prior to colonoscopy scheduled for 01/22/2024. Last labs (CBC/BMET) 11/09/2023. Thank you.

## 2023-12-21 NOTE — Progress Notes (Signed)
 Remote PPM Transmission

## 2023-12-26 ENCOUNTER — Telehealth (HOSPITAL_BASED_OUTPATIENT_CLINIC_OR_DEPARTMENT_OTHER): Payer: Self-pay | Admitting: *Deleted

## 2023-12-26 NOTE — Telephone Encounter (Signed)
 Patient with diagnosis of Afib on Eliquis  for anticoagulation.    Procedure:  COLONOSCOPY   Date of Surgery:  Clearance 01/22/24      CHA2DS2-VASc Score = 4   This indicates a 4.8% annual risk of stroke. The patient's score is based upon: CHF History: 1 HTN History: 1 Diabetes History: 0 Stroke History: 0 Vascular Disease History: 1 Age Score: 1 Gender Score: 0    CrCl 85 ml/min Platelet count 178 K  Patient has not had an Afib/aflutter ablation within the last 3 months or DCCV within the last 30 days  Per office protocol, patient can hold Eliquis  for 2 days prior to procedure.    Patient will not need bridging with Lovenox  (enoxaparin ) around procedure.  **This guidance is not considered finalized until pre-operative APP has relayed final recommendations.**

## 2023-12-26 NOTE — Telephone Encounter (Signed)
 Pt has been scheduled tele preop appt 01/09/24. Med rec and consent are done.

## 2023-12-26 NOTE — Telephone Encounter (Signed)
 Pt has been scheduled tele preop appt 01/09/24. Med rec and consent are done.      Patient Consent for Virtual Visit        Harold Robertson has provided verbal consent on 12/26/2023 for a virtual visit (video or telephone).   CONSENT FOR VIRTUAL VISIT FOR:  Harold Robertson  By participating in this virtual visit I agree to the following:  I hereby voluntarily request, consent and authorize [facility] and its employed or contracted physicians, physician assistants, nurse practitioners or other licensed health care professionals (the Practitioner), to provide me with telemedicine health care services (the "Services) as deemed necessary by the treating Practitioner. I acknowledge and consent to receive the Services by the Practitioner via telemedicine. I understand that the telemedicine visit will involve communicating with the Practitioner through live audiovisual communication technology and the disclosure of certain medical information by electronic transmission. I acknowledge that I have been given the opportunity to request an in-person assessment or other available alternative prior to the telemedicine visit and am voluntarily participating in the telemedicine visit.  I understand that I have the right to withhold or withdraw my consent to the use of telemedicine in the course of my care at any time, without affecting my right to future care or treatment, and that the Practitioner or I may terminate the telemedicine visit at any time. I understand that I have the right to inspect all information obtained and/or recorded in the course of the telemedicine visit and may receive copies of available information for a reasonable fee.  I understand that some of the potential risks of receiving the Services via telemedicine include:  Delay or interruption in medical evaluation due to technological equipment failure or disruption; Information transmitted may not be sufficient (e.g. poor  resolution of images) to allow for appropriate medical decision making by the Practitioner; and/or  In rare instances, security protocols could fail, causing a breach of personal health information.  Furthermore, I acknowledge that it is my responsibility to provide information about my medical history, conditions and care that is complete and accurate to the best of my ability. I acknowledge that Practitioner's advice, recommendations, and/or decision may be based on factors not within their control, such as incomplete or inaccurate data provided by me or distortions of diagnostic images or specimens that may result from electronic transmissions. I understand that the practice of medicine is not an exact science and that Practitioner makes no warranties or guarantees regarding treatment outcomes. I acknowledge that a copy of this consent can be made available to me via my patient portal [facility] MyChart), or I can request a printed copy by calling the office of [facility].    I understand that my insurance will be billed for this visit.   I have read or had this consent read to me. I understand the contents of this consent, which adequately explains the benefits and risks of the Services being provided via telemedicine.  I have been provided ample opportunity to ask questions regarding this consent and the Services and have had my questions answered to my satisfaction. I give my informed consent for the services to be provided through the use of telemedicine in my medical care

## 2023-12-26 NOTE — Telephone Encounter (Signed)
   Name: Harold Robertson  DOB: Sep 21, 1950  MRN: [id number]  Primary Cardiologist: Annabella Scarce, MD   Preoperative team, please contact this patient and set up a phone call appointment for further preoperative risk assessment. Please obtain consent and complete medication review. Thank you for your help.  I confirm that guidance regarding antiplatelet and oral anticoagulation therapy has been completed and, if necessary, noted below.  I also confirmed the patient resides in the state of  . As per [facility] Medical Board telemedicine laws, the patient must reside in the state in which the provider is licensed.   Jon Nat Hails, PA 12/26/2023, 3:33 PM [facility]

## 2024-01-02 ENCOUNTER — Telehealth: Payer: Self-pay | Admitting: Cardiology

## 2024-01-02 NOTE — Telephone Encounter (Signed)
 Pt would like a c/b to reschedule an appt 10/15

## 2024-01-02 NOTE — Telephone Encounter (Signed)
 I s/w the pt and he tells me that he may have jury duty 01/09/24. Rescheduled to 01/08/24.

## 2024-01-08 ENCOUNTER — Ambulatory Visit: Attending: Cardiology | Admitting: Physician Assistant

## 2024-01-08 DIAGNOSIS — Z01810 Encounter for preprocedural cardiovascular examination: Secondary | ICD-10-CM | POA: Diagnosis not present

## 2024-01-08 NOTE — Progress Notes (Signed)
 Virtual Visit via Telephone Note   Because of Harold Robertson co-morbid illnesses, he is at least at moderate risk for complications without adequate follow up.  This format is felt to be most appropriate for this patient at this time.  Due to technical limitations with video connection (technology), today's appointment will be conducted as an audio only telehealth visit, and Harold Robertson verbally agreed to proceed in this manner.   All issues noted in this document were discussed and addressed.  No physical exam could be performed with this format.  Evaluation Performed:  Preoperative cardiovascular risk assessment _____________   Date:  01/08/2024   Patient ID:  Harold Robertson, DOB June 28, 1950, MRN [id number] Patient Location:  Home Provider location:   Office  Primary Care Provider:  Duanne Butler DASEN, MD Primary Cardiologist:  Annabella Scarce, MD  Chief Complaint / Patient Profile   73 y.o. y/o male with a h/o hypertension, obesity, sleep apnea, complete heart block, atrial fibrillation who is pending colonoscopy and presents today for telephonic preoperative cardiovascular risk assessment.  History of Present Illness    Harold Robertson is a 73 y.o. male who presents via audio/video conferencing for a telehealth visit today.  Pt was last seen in cardiology clinic on 05/10/2023 by Dr. Inocencio.  At that time Harold Robertson was doing well.  The patient is now pending procedure as outlined above. Since his last visit, he has been doing well from a cardiac standpoint.  No issues with atrial fibrillation to his knowledge.  He does have a pacemaker check next month.  He does meet minimal METS requirements.  Per office protocol, patient can hold Eliquis  x 2 days prior to the procedure.  Please resume when medically safe to do so.   Past Medical History    Past Medical History:  Diagnosis Date   Arthritis    Atrial fibrillation (HCC)    Complete heart block (HCC)     Hypertension    OSA on CPAP    Presence of permanent cardiac pacemaker    Sleep apnea    Past Surgical History:  Procedure Laterality Date   APPENDECTOMY  1960s   CARDIAC CATHETERIZATION Right 09/08/2015   Procedure: Temporary Pacemaker;  Surgeon: Will Gladis Inocencio, MD;  Location: [facility];  Service: Cardiovascular;  Laterality: Right;   CARDIAC CATHETERIZATION N/A 09/09/2015   Procedure: Left Heart Cath and Coronary Angiography;  Surgeon: Peter M Swaziland, MD;  Location: [facility] CV [facility];  Service: Cardiovascular;  Laterality: N/A;   CARDIOVERSION N/A 01/28/2020   Procedure: CARDIOVERSION;  Surgeon: Jeffrie Oneil BROCKS, MD;  Location[facility];  Service: Cardiovascular;  Laterality: N/A;   CHEILECTOMY Left 03/15/2018   Procedure: CHEILECTOMY LEFT FOOT;  Surgeon: Beverley Evalene BIRCH, MD;  Location: [facility];  Service: Orthopedics;  Laterality: Left;   COLONOSCOPY     CYSTOSCOPY N/A 03/22/2021   Procedure: CYSTOSCOPY FLEXIBLE; ATTEMPTED FOLEY INSERTION;  Surgeon: Beverley Evalene BIRCH, MD;  Location: [facility];  Service: Orthopedics;  Laterality: N/A;   EP IMPLANTABLE DEVICE N/A 09/09/2015   Procedure: BiV Pacemaker Insertion CRT-P;  Surgeon: Will Gladis Inocencio, MD;  Location: [facility];  Service: Cardiovascular;  Laterality: N/A;   JOINT REPLACEMENT     KNEE SURGERY Right    PPM GENERATOR CHANGEOUT N/A 02/02/2022   Procedure: PPM GENERATOR CHANGEOUT;  Surgeon: Inocencio Soyla Gladis, MD;  Location: [facility];  Service: Cardiovascular;  Laterality: N/A;  TOTAL KNEE ARTHROPLASTY Right 07/11/2016   TOTAL KNEE ARTHROPLASTY Right 07/11/2016   Procedure: TOTAL KNEE ARTHROPLASTY;  Surgeon: Evalene JONETTA Chancy, MD;  Location: [facility];  Service: Orthopedics;  Laterality: Right;   TOTAL KNEE ARTHROPLASTY WITH REVISION COMPONENTS Right 03/22/2021   Procedure: TOTAL KNEE ARTHROPLASTY WITH REVISION COMPONENTS (POLY EXCHANGE); I&D;  Surgeon: Chancy Evalene JONETTA, MD;   Location: [facility];  Service: Orthopedics;  Laterality: Right;    Allergies  No Known Allergies  Home Medications    Prior to Admission medications   Medication Sig Start Date End Date Taking? Authorizing Provider  acetaminophen  (TYLENOL ) 500 MG tablet Take 2 tablets (1,000 mg total) by mouth every 6 (six) hours as needed for mild pain or moderate pain. Patient taking differently: Take 1,000 mg by mouth every 8 (eight) hours as needed for mild pain (pain score 1-3) or moderate pain (pain score 4-6). 03/24/21   Gawne, Meghan M, PA-C  apixaban  (ELIQUIS ) 5 MG TABS tablet Take 1 tablet (5 mg total) by mouth 2 (two) times daily. 10/04/23   Camnitz, Soyla Lunger, MD  atorvastatin  (LIPITOR) 40 MG tablet Take 1 tablet (40 mg total) by mouth daily at 6 PM. 06/07/22   Camnitz, Soyla Lunger, MD  carvedilol  (COREG ) 12.5 MG tablet Take 1 tablet (12.5 mg total) by mouth 2 (two) times daily. 10/04/23   Camnitz, Soyla Lunger, MD  cholecalciferol (VITAMIN D ) 25 MCG (1000 UNIT) tablet Take 1,000 Units by mouth daily.    [provider]  lisinopril  (ZESTRIL ) 20 MG tablet Take 1 tablet (20 mg total) by mouth daily. 03/05/23   Duanne Butler DASEN, MD  tirzepatide  (ZEPBOUND ) 2.5 MG/0.5ML Pen Inject 2.5 mg into the skin once a week. Patient not taking: Reported on 12/26/2023 11/09/23   Duanne Butler DASEN, MD    Physical Exam    Vital Signs:  Harold Robertson does not have vital signs available for review today.  Given telephonic nature of communication, physical exam is limited. AAOx3. NAD. Normal affect.  Speech and respirations are unlabored.  Accessory Clinical Findings    None  Assessment & Plan    1.  Preoperative Cardiovascular Risk Assessment:  Mr. Luu perioperative risk of a major cardiac event is 0.4% according to the Revised Cardiac Risk Index (RCRI).  Therefore, he is at low risk for perioperative complications.   His functional capacity is good at 5.62 METs according to the [facility] Activity  Status Index (DASI). Recommendations: According to ACC/AHA guidelines, no further cardiovascular testing needed.  The patient may proceed to surgery at acceptable risk.   Antiplatelet and/or Anticoagulation Recommendations:  Eliquis  (Apixaban ) can be held for 2 days prior to surgery.  Please resume post op when felt to be safe.    The patient was advised that if he develops new symptoms prior to surgery to contact our office to arrange for a follow-up visit, and he verbalized understanding.  A copy of this note will be routed to requesting surgeon.  Time:   Today, I have spent 5 minutes with the patient with telehealth technology discussing medical history, symptoms, and management plan.     Orren LOISE Fabry, PA-C  01/08/2024, 8:49 AM

## 2024-01-09 ENCOUNTER — Telehealth

## 2024-01-18 ENCOUNTER — Ambulatory Visit (INDEPENDENT_AMBULATORY_CARE_PROVIDER_SITE_OTHER)

## 2024-01-18 DIAGNOSIS — Z23 Encounter for immunization: Secondary | ICD-10-CM

## 2024-01-18 NOTE — Progress Notes (Signed)
 Patient is in office today for a nurse visit for Shingrix  # 2 Immunization. Patient Injection was given in the  Right deltoid. Patient tolerated injection well.

## 2024-01-22 DIAGNOSIS — K635 Polyp of colon: Secondary | ICD-10-CM | POA: Diagnosis not present

## 2024-01-22 DIAGNOSIS — Z860101 Personal history of adenomatous and serrated colon polyps: Secondary | ICD-10-CM | POA: Diagnosis not present

## 2024-01-22 DIAGNOSIS — K5730 Diverticulosis of large intestine without perforation or abscess without bleeding: Secondary | ICD-10-CM | POA: Diagnosis not present

## 2024-01-22 DIAGNOSIS — Z09 Encounter for follow-up examination after completed treatment for conditions other than malignant neoplasm: Secondary | ICD-10-CM | POA: Diagnosis not present

## 2024-01-22 DIAGNOSIS — K621 Rectal polyp: Secondary | ICD-10-CM | POA: Diagnosis not present

## 2024-01-22 DIAGNOSIS — Z800 Family history of malignant neoplasm of digestive organs: Secondary | ICD-10-CM | POA: Diagnosis not present

## 2024-01-22 DIAGNOSIS — D123 Benign neoplasm of transverse colon: Secondary | ICD-10-CM | POA: Diagnosis not present

## 2024-01-22 DIAGNOSIS — D122 Benign neoplasm of ascending colon: Secondary | ICD-10-CM | POA: Diagnosis not present

## 2024-01-23 ENCOUNTER — Encounter: Payer: Self-pay | Admitting: Gastroenterology

## 2024-01-23 ENCOUNTER — Other Ambulatory Visit: Payer: Self-pay | Admitting: Gastroenterology

## 2024-01-23 DIAGNOSIS — Z86018 Personal history of other benign neoplasm: Secondary | ICD-10-CM

## 2024-01-23 DIAGNOSIS — Z800 Family history of malignant neoplasm of digestive organs: Secondary | ICD-10-CM

## 2024-01-24 DIAGNOSIS — D122 Benign neoplasm of ascending colon: Secondary | ICD-10-CM | POA: Diagnosis not present

## 2024-01-24 DIAGNOSIS — D123 Benign neoplasm of transverse colon: Secondary | ICD-10-CM | POA: Diagnosis not present

## 2024-01-24 DIAGNOSIS — K635 Polyp of colon: Secondary | ICD-10-CM | POA: Diagnosis not present

## 2024-01-24 DIAGNOSIS — K621 Rectal polyp: Secondary | ICD-10-CM | POA: Diagnosis not present

## 2024-02-04 ENCOUNTER — Ambulatory Visit (INDEPENDENT_AMBULATORY_CARE_PROVIDER_SITE_OTHER): Payer: Medicare Other

## 2024-02-04 DIAGNOSIS — I442 Atrioventricular block, complete: Secondary | ICD-10-CM | POA: Diagnosis not present

## 2024-02-04 LAB — CUP PACEART REMOTE DEVICE CHECK
Battery Remaining Longevity: 70 mo
Battery Remaining Percentage: 73 %
Battery Voltage: 2.98 V
Brady Statistic AP VP Percent: 89 %
Brady Statistic AP VS Percent: 1 %
Brady Statistic AS VP Percent: 11 %
Brady Statistic AS VS Percent: 1 %
Brady Statistic RA Percent Paced: 89 %
Date Time Interrogation Session: 20251110034059
Implantable Lead Connection Status: 753985
Implantable Lead Connection Status: 753985
Implantable Lead Connection Status: 753985
Implantable Lead Implant Date: 20170615
Implantable Lead Implant Date: 20170615
Implantable Lead Implant Date: 20170615
Implantable Lead Location: 753858
Implantable Lead Location: 753859
Implantable Lead Location: 753860
Implantable Pulse Generator Implant Date: 20231109
Lead Channel Impedance Value: 490 Ohm
Lead Channel Impedance Value: 510 Ohm
Lead Channel Impedance Value: 990 Ohm
Lead Channel Pacing Threshold Amplitude: 0.5 V
Lead Channel Pacing Threshold Amplitude: 1 V
Lead Channel Pacing Threshold Amplitude: 1.5 V
Lead Channel Pacing Threshold Pulse Width: 0.4 ms
Lead Channel Pacing Threshold Pulse Width: 0.4 ms
Lead Channel Pacing Threshold Pulse Width: 1 ms
Lead Channel Sensing Intrinsic Amplitude: 5 mV
Lead Channel Sensing Intrinsic Amplitude: 7.5 mV
Lead Channel Setting Pacing Amplitude: 1.5 V
Lead Channel Setting Pacing Amplitude: 2 V
Lead Channel Setting Pacing Amplitude: 2 V
Lead Channel Setting Pacing Pulse Width: 0.4 ms
Lead Channel Setting Pacing Pulse Width: 1 ms
Lead Channel Setting Sensing Sensitivity: 5 mV
Pulse Gen Model: 3562
Pulse Gen Serial Number: 8121406

## 2024-02-05 ENCOUNTER — Ambulatory Visit: Payer: Self-pay | Admitting: Cardiology

## 2024-02-08 ENCOUNTER — Ambulatory Visit
Admission: RE | Admit: 2024-02-08 | Discharge: 2024-02-08 | Disposition: A | Discharge status: Home / Self Care | Source: Ambulatory Visit | Attending: Gastroenterology | Admitting: Gastroenterology

## 2024-02-08 DIAGNOSIS — Z86018 Personal history of other benign neoplasm: Secondary | ICD-10-CM

## 2024-02-08 DIAGNOSIS — Z800 Family history of malignant neoplasm of digestive organs: Secondary | ICD-10-CM

## 2024-02-08 NOTE — Progress Notes (Signed)
 Remote PPM Transmission

## 2024-03-24 ENCOUNTER — Other Ambulatory Visit: Payer: Self-pay | Admitting: Cardiology

## 2024-03-26 MED ORDER — ATORVASTATIN CALCIUM 40 MG PO TABS: 40.0000 mg | ORAL_TABLET | Freq: Every day | ORAL | 0 refills | Status: AC

## 2024-04-05 ENCOUNTER — Other Ambulatory Visit: Payer: Self-pay | Admitting: Cardiology

## 2024-04-05 DIAGNOSIS — I5022 Chronic systolic (congestive) heart failure: Secondary | ICD-10-CM

## 2024-04-06 ENCOUNTER — Other Ambulatory Visit: Payer: Self-pay | Admitting: Family Medicine

## 2024-04-06 DIAGNOSIS — I10 Essential (primary) hypertension: Secondary | ICD-10-CM

## 2024-05-05 ENCOUNTER — Ambulatory Visit: Payer: Self-pay

## 2024-08-04 ENCOUNTER — Ambulatory Visit: Payer: Self-pay

## 2024-10-29 ENCOUNTER — Ambulatory Visit

## 2024-11-03 ENCOUNTER — Ambulatory Visit: Payer: Self-pay
